# Patient Record
Sex: Female | Born: 1970 | Hispanic: No | Smoking: Former smoker
Health system: Southern US, Community
[De-identification: ages and names within clinical notes are randomized; demographics above are authoritative.]

## PROBLEM LIST (undated history)

## (undated) ENCOUNTER — Inpatient Hospital Stay (HOSPITAL_COMMUNITY): Payer: Self-pay

## (undated) ENCOUNTER — Emergency Department (HOSPITAL_BASED_OUTPATIENT_CLINIC_OR_DEPARTMENT_OTHER): Admission: EM | Payer: BC Managed Care – PPO | Source: Home / Self Care

## (undated) DIAGNOSIS — F17200 Nicotine dependence, unspecified, uncomplicated: Secondary | ICD-10-CM

## (undated) DIAGNOSIS — J45909 Unspecified asthma, uncomplicated: Secondary | ICD-10-CM

## (undated) DIAGNOSIS — F419 Anxiety disorder, unspecified: Secondary | ICD-10-CM

## (undated) DIAGNOSIS — F32A Depression, unspecified: Secondary | ICD-10-CM

## (undated) DIAGNOSIS — Z5189 Encounter for other specified aftercare: Secondary | ICD-10-CM

## (undated) DIAGNOSIS — R6889 Other general symptoms and signs: Secondary | ICD-10-CM

## (undated) DIAGNOSIS — Z21 Asymptomatic human immunodeficiency virus [HIV] infection status: Secondary | ICD-10-CM

## (undated) DIAGNOSIS — B20 Human immunodeficiency virus [HIV] disease: Secondary | ICD-10-CM

## (undated) DIAGNOSIS — D649 Anemia, unspecified: Secondary | ICD-10-CM

## (undated) DIAGNOSIS — F329 Major depressive disorder, single episode, unspecified: Secondary | ICD-10-CM

## (undated) HISTORY — DX: Unspecified asthma, uncomplicated: J45.909

## (undated) HISTORY — DX: Nicotine dependence, unspecified, uncomplicated: F17.200

## (undated) HISTORY — DX: Encounter for other specified aftercare: Z51.89

## (undated) HISTORY — DX: Anxiety disorder, unspecified: F41.9

## (undated) HISTORY — DX: Other general symptoms and signs: R68.89

---

## 1995-05-11 DIAGNOSIS — Z5189 Encounter for other specified aftercare: Secondary | ICD-10-CM

## 1995-05-11 DIAGNOSIS — IMO0001 Reserved for inherently not codable concepts without codable children: Secondary | ICD-10-CM

## 1995-05-11 HISTORY — DX: Encounter for other specified aftercare: Z51.89

## 1995-05-11 HISTORY — DX: Reserved for inherently not codable concepts without codable children: IMO0001

## 2011-01-26 LAB — US OB LIMITED

## 2011-02-17 LAB — GC/CHLAMYDIA PROBE AMP, GENITAL
Chlamydia: NEGATIVE
Gonorrhea: NEGATIVE

## 2011-02-17 LAB — RPR: RPR: NONREACTIVE

## 2011-02-17 LAB — ANTIBODY SCREEN: Antibody Screen: NEGATIVE

## 2011-02-17 LAB — HEPATITIS B SURFACE ANTIGEN: Hepatitis B Surface Ag: NEGATIVE

## 2011-03-26 LAB — US OB COMP + 14 WK

## 2011-05-10 DIAGNOSIS — B20 Human immunodeficiency virus [HIV] disease: Secondary | ICD-10-CM

## 2011-05-10 DIAGNOSIS — Z98891 History of uterine scar from previous surgery: Secondary | ICD-10-CM | POA: Insufficient documentation

## 2011-05-10 DIAGNOSIS — O09529 Supervision of elderly multigravida, unspecified trimester: Secondary | ICD-10-CM | POA: Insufficient documentation

## 2011-05-10 NOTE — Progress Notes (Signed)
Needs to nutrition and Child psychotherapist.

## 2011-05-13 ENCOUNTER — Ambulatory Visit (INDEPENDENT_AMBULATORY_CARE_PROVIDER_SITE_OTHER): Payer: Self-pay | Admitting: Obstetrics & Gynecology

## 2011-05-13 ENCOUNTER — Ambulatory Visit: Payer: Self-pay

## 2011-05-13 ENCOUNTER — Ambulatory Visit (INDEPENDENT_AMBULATORY_CARE_PROVIDER_SITE_OTHER): Payer: Self-pay

## 2011-05-13 DIAGNOSIS — O98519 Other viral diseases complicating pregnancy, unspecified trimester: Secondary | ICD-10-CM

## 2011-05-13 DIAGNOSIS — O09529 Supervision of elderly multigravida, unspecified trimester: Secondary | ICD-10-CM

## 2011-05-13 DIAGNOSIS — B20 Human immunodeficiency virus [HIV] disease: Secondary | ICD-10-CM

## 2011-05-13 DIAGNOSIS — O0990 Supervision of high risk pregnancy, unspecified, unspecified trimester: Secondary | ICD-10-CM

## 2011-05-13 DIAGNOSIS — O09899 Supervision of other high risk pregnancies, unspecified trimester: Secondary | ICD-10-CM

## 2011-05-13 DIAGNOSIS — Z9889 Other specified postprocedural states: Secondary | ICD-10-CM

## 2011-05-13 DIAGNOSIS — Z98891 History of uterine scar from previous surgery: Secondary | ICD-10-CM

## 2011-05-13 DIAGNOSIS — Z111 Encounter for screening for respiratory tuberculosis: Secondary | ICD-10-CM

## 2011-05-13 DIAGNOSIS — Z21 Asymptomatic human immunodeficiency virus [HIV] infection status: Secondary | ICD-10-CM

## 2011-05-13 NOTE — Progress Notes (Signed)
U/S scheduled 05/17/11 at 10 am. ROI signed for first U/S report. Tel.# given for [employer].

## 2011-05-13 NOTE — Progress Notes (Signed)
No vaginal discharge. Pt has appt with infectious disease today. Pt will bring name of pharmacy to next visit. Pt needs to nutrition and social worker today.

## 2011-05-13 NOTE — Progress Notes (Signed)
Nutrition Note: (1st Nutrition consult- 1st HRC visit) Pt new to area from [place], does not have WIC services, applying for [employer] and food stamps. Dx. HIV positive, obesity, advanced maternal age.  Pt reports good intake of 3-4 meals, with no food allergies, does not eat pork. Reports some nausea and heartburn, but no vomiting.  Pt has excessive wt gain linked to new anti-viral medication per patient.  Reports pregravid wt of 198#, current wt of 225.5# plots 17#> expected at [redacted]w[redacted]d gestation.  Disc need to increase physical activity asap and limit daily intake to 3-4 small, healthy meals.  Referral made to [facility] program.  Follow up in 2-4 weeks.  Cy Blamer, RD

## 2011-05-13 NOTE — Progress Notes (Signed)
Need first [place] report--sign record release.  Pt not sure of LMP.  18-week [place] refers to dating [place] which we need.  The pt doesn't think the dating [place] was accurate because the [place] tech did not specialize in OB.  Again, we will get the report.  Wt gain 27 pounds so far.  Discussed optimal weight gain is 12-20 pounds.  Meet with nutrition today.  Referral to Infectious disease.  Size > Dates so will get growth [place].  Viral load undetectable. Pt's preterm delivery was after a fall.  There was fetal distress and ended up with c/s.   Discussing VBAC vs rpt c/s.  GCT next visit. Pt states she can not have males in the delivery.  I explained we have 67 female MDs in our practice and we CAN NOT guarantee that there will be a female available at the delivery.  If there is a planned c/s, then having a female can be better accommodated.  Told patient we would be happy to take care of her.  They asked for [facility] number as there is 2 females in the practice.  Pt understands there are female anesthesiologists and pediatricians in the hospital who may be in the delivery room. Infectious dz referral.

## 2011-05-14 LAB — CBC WITH DIFFERENTIAL/PLATELET
Basophils Absolute: 0 10*3/uL (ref 0.0–0.1)
Eosinophils Relative: 1 % (ref 0–5)
Lymphocytes Relative: 32 % (ref 12–46)
Lymphs Abs: 2 10*3/uL (ref 0.7–4.0)
Neutro Abs: 3.7 10*3/uL (ref 1.7–7.7)
Neutrophils Relative %: 59 % (ref 43–77)
Platelets: 241 10*3/uL (ref 150–400)
RBC: 3.63 MIL/uL — ABNORMAL LOW (ref 3.87–5.11)
RDW: 17.3 % — ABNORMAL HIGH (ref 11.5–15.5)
WBC: 6.2 10*3/uL (ref 4.0–10.5)

## 2011-05-14 LAB — URINALYSIS
Bilirubin Urine: NEGATIVE
Protein, ur: NEGATIVE mg/dL
Urobilinogen, UA: 0.2 mg/dL (ref 0.0–1.0)

## 2011-05-14 LAB — HEPATITIS B SURFACE ANTIBODY,QUALITATIVE: Hep B S Ab: POSITIVE — AB

## 2011-05-14 LAB — HEPATITIS A ANTIBODY, TOTAL: Hep A Total Ab: POSITIVE — AB

## 2011-05-14 LAB — COMPLETE METABOLIC PANEL WITH GFR
ALT: 9 U/L (ref 0–35)
AST: 17 U/L (ref 0–37)
CO2: 23 mEq/L (ref 19–32)
Calcium: 8.8 mg/dL (ref 8.4–10.5)
Chloride: 102 mEq/L (ref 96–112)
Creat: 0.42 mg/dL — ABNORMAL LOW (ref 0.50–1.10)
GFR, Est African American: >89 mL/min
Sodium: 136 mEq/L (ref 135–145)
Total Protein: 6 g/dL (ref 6.0–8.3)

## 2011-05-14 LAB — HEPATITIS B SURFACE ANTIGEN: Hepatitis B Surface Ag: NEGATIVE

## 2011-05-14 LAB — GC/CHLAMYDIA PROBE AMP, URINE: Chlamydia, Swab/Urine, PCR: NEGATIVE

## 2011-05-14 LAB — LIPID PANEL
Cholesterol: 222 mg/dL — ABNORMAL HIGH (ref 0–200)
VLDL: 17 mg/dL (ref 0–40)

## 2011-05-14 LAB — HEPATITIS C ANTIBODY: HCV Ab: NEGATIVE

## 2011-05-14 LAB — T-HELPER CELL (CD4) - (RCID CLINIC ONLY): CD4 % Helper T Cell: 29 % — ABNORMAL LOW (ref 33–55)

## 2011-05-17 ENCOUNTER — Ambulatory Visit (HOSPITAL_COMMUNITY)
Admission: RE | Admit: 2011-05-17 | Discharge: 2011-05-17 | Disposition: A | Discharge status: Home / Self Care | Payer: Medicaid Other | Source: Ambulatory Visit | Attending: Obstetrics & Gynecology | Admitting: Obstetrics & Gynecology

## 2011-05-17 DIAGNOSIS — B20 Human immunodeficiency virus [HIV] disease: Secondary | ICD-10-CM

## 2011-05-17 DIAGNOSIS — O98519 Other viral diseases complicating pregnancy, unspecified trimester: Secondary | ICD-10-CM | POA: Insufficient documentation

## 2011-05-17 DIAGNOSIS — O3660X0 Maternal care for excessive fetal growth, unspecified trimester, not applicable or unspecified: Secondary | ICD-10-CM | POA: Insufficient documentation

## 2011-05-17 DIAGNOSIS — Z21 Asymptomatic human immunodeficiency virus [HIV] infection status: Secondary | ICD-10-CM | POA: Insufficient documentation

## 2011-05-17 LAB — HIV-1 RNA ULTRAQUANT REFLEX TO GENTYP+: HIV-1 RNA Quant, Log: <1.3 {Log} (ref <1.30)

## 2011-05-20 ENCOUNTER — Telehealth: Payer: Self-pay | Admitting: *Deleted

## 2011-05-20 NOTE — Telephone Encounter (Signed)
Pt states she forgot to call but her skin never got raised or hard & red. Remained smooth.

## 2011-05-24 NOTE — Progress Notes (Signed)
Pt is Muslim and has requested a female ID physician. She states [facility] has suggested she seek private OB care due to the need of a female physician.  [facility] will [facility] see if this it true since they follow our high risk pregnancies.   Per patient there is some concern about estimated date of delivery due to fundus height measurements.  She is scheduled for ultrasound on Jan. 17, 2012. Pt's given HIV history is not verified via medical records. Her husband is present and has mostly dominated the interview.  When questions were asked they both had two different answers.  Spouse is concerned about wife being labeled and treated differently due to HIV diagnosis. He was assured this was not practice at [facility].   Brief medical records obtained from [facility] which mostly cover pregnancy diagnosis and treatment since October , 2012.

## 2011-05-25 ENCOUNTER — Encounter: Payer: Self-pay | Admitting: Internal Medicine

## 2011-05-25 ENCOUNTER — Ambulatory Visit (INDEPENDENT_AMBULATORY_CARE_PROVIDER_SITE_OTHER): Payer: Self-pay | Admitting: Internal Medicine

## 2011-05-25 ENCOUNTER — Telehealth: Payer: Self-pay | Admitting: Internal Medicine

## 2011-05-25 DIAGNOSIS — B20 Human immunodeficiency virus [HIV] disease: Secondary | ICD-10-CM

## 2011-05-25 DIAGNOSIS — IMO0002 Reserved for concepts with insufficient information to code with codable children: Secondary | ICD-10-CM

## 2011-05-25 DIAGNOSIS — Z21 Asymptomatic human immunodeficiency virus [HIV] infection status: Secondary | ICD-10-CM

## 2011-05-25 DIAGNOSIS — E669 Obesity, unspecified: Secondary | ICD-10-CM | POA: Insufficient documentation

## 2011-05-25 DIAGNOSIS — O98919 Unspecified maternal infectious and parasitic disease complicating pregnancy, unspecified trimester: Secondary | ICD-10-CM

## 2011-05-25 MED ORDER — RITONAVIR 100 MG PO CAPS: 100.0000 mg | ORAL_CAPSULE | Freq: Every day | ORAL | Status: DC

## 2011-05-25 MED ORDER — EMTRICITABINE-TENOFOVIR DF 200-300 MG PO TABS: 1.0000 | ORAL_TABLET | Freq: Every day | ORAL | Status: DC

## 2011-05-25 MED ORDER — ATAZANAVIR SULFATE 200 MG PO CAPS: 400.0000 mg | ORAL_CAPSULE | Freq: Every day | ORAL | Status: DC

## 2011-05-25 NOTE — Progress Notes (Signed)
INFECTIOUS DISEASES HIV CLINIC  RFV: establishing care while starting 3rd trimester of pregnancy  Subjective:    Patient ID: [name] [name], female    DOB: 28-Nov-1970, 41 y.o.   MRN: [id number]  HPI [name] [name] is a 41yo Female originally from [place], with HIV disease originally diagnosed in 1995, her current CD 4 count 620(29%)/ <20 viral copies on truvada/ATZ/RTV. She is also currently 27 3/[redacted] wk pregnant with her 4th child. She moved to [place], [place] in mid Dec 2012 from [place], [place] where she was receiving HIV and prenatal care. She has 3 other children, all girls ages 57,16,11 from a different partner. The patient has been with a new partner in the < 87yrs, who is the father of the child she is carrying. He, too, is HIV positive (getting care from state of [place]).   In regards to her pregnancy, she is fatigued on most days. Occasionally has sleep disturbances secondary to baby moving and GERD. No vaginal bleeding. No contractions. She is only eating 2-3 meals per day. Concerned about weight gain. She has seen OB at [facility] clinic in town in early January to establish care. She is scheduled for OGTT on the 17th. She would like to get TDAP vaccine.  She mentioned that in early 2012 she gained 30-36 pounds, thought to be due to change in ART.   HIV history: Diagnosed 1995-97 in [place], RF: blood transfusion ART in 2001 included combivir and viracept ART in 2012 changed to atazanavir/rit/truvada Hep B immune Hep A immune  OB/GYN history: [id number] c-section in 2001 HPV negative/normal pap 04/13/10 GC/Chlam negative 09/18/10 RPR negative 02/17/11 U/S on 01/26/11 (10wks) EDD of 08/23/10/ LMP 11/22/10 toxo negative Rubella immune CMV + Blood type O, RH+  IMS: 05/01/09 tetanus 09/26/09 pneumococcal Last received flu 2004, had reaction? Ill x 7 days.  Health maintenance: quantiferon negative 09/24/10   Active Ambulatory Problems    Diagnosis Date Noted  . HIV (human immunodeficiency virus  infection) 05/10/2011  . AMA (advanced maternal age) multigravida 35+ 05/10/2011  . History of C-section 05/10/2011  . Pregnancy, supervision for, high-risk 05/13/2011   Resolved Ambulatory Problems    Diagnosis Date Noted  . No Resolved Ambulatory Problems   Past Medical History  Diagnosis Date  . Anxiety   . Blood transfusion 1995  . Preterm labor    Prior to Admission medications   Medication Sig Start Date End Date Taking? Authorizing Provider  atazanavir (REYATAZ) 300 MG capsule Take 300 mg by mouth daily.     Yes Historical Provider, MD  emtricitabine-tenofovir (TRUVADA) 200-300 MG per tablet Take 1 tablet by mouth daily.     Yes Historical Provider, MD  Prenatal Vit-Fe Fumarate-FA (PRENATAL MULTIVITAMIN) 60-1 MG tablet Take 1 tablet by mouth daily.     Yes Historical Provider, MD  ritonavir (NORVIR) 100 MG capsule Take 100 mg by mouth daily.     Yes Historical Provider, MD   Social history:  reports that she has never smoked. She has never used smokeless tobacco. She reports that she does not drink alcohol or use illicit drugs. She is a Research officer, trade union, and is asking for female provider for both her HIV physician as well as OB if possible. She worked as a Lawyer in [place], [place]. Currently not working due to her pregnancy and recently relocation. She is the mother of 3 other girls ages 82, 78,and 38 yo. She and her partner (unclear if married) now live in [place] to be closer to his mother for  helping with childrearing. She is in a relationship with HIV + female, however, they likely have different strains, since she was infected prior to them meeting. He is on different HIV regimen but ATZ based.  Family history: family history is not on file.   Review of Systems   Constitutional: Negative for fever, chills, diaphoresis. She does have fatigue, increased appetite, and weight change due to pregnancy HENT: Negative for congestion, sore throat, rhinorrhea, sneezing, trouble swallowing  and sinus pressure.  Eyes: Negative for photophobia and visual disturbance.  Respiratory: Negative for cough, chest tightness, shortness of breath, wheezing and stridor.  Cardiovascular: Negative for chest pain, palpitations and leg swelling.  Gastrointestinal: occassional for nausea, but no vomiting, abdominal pain, diarrhea, constipation, blood in stool, abdominal distention and anal bleeding.  Genitourinary: Negative for dysuria, hematuria, flank pain and difficulty urinating.  Musculoskeletal: Negative for myalgias, back pain, joint swelling, arthralgias and gait problem.  Skin: Negative for color change, pallor, rash and wound.  Neurological: Negative for dizziness, tremors, weakness and light-headedness.  Hematological: Negative for adenopathy. Does not bruise/bleed easily.  Psychiatric/Behavioral: Negative for behavioral problems, confusion, sleep disturbance, dysphoric mood, decreased concentration and agitation.       Objective:   Physical Exam BP 101/67  Pulse 71  Temp(Src) 97.5 F (36.4 C) (Oral)  Ht 5\' 3"  (1.6 m)  Wt 224 lb (101.606 kg)  BMI 39.68 kg/m2  LMP 11/22/2010  General Appearance:    Alert, cooperative, no distress, appears stated age, but fatigue appearing  Head:    Normocephalic, without obvious abnormality, atraumatic  Eyes:    PERRL, conjunctiva/corneas clear, EOM's intact, fundi    benign, both eyes  Ears:    Normal TM's and external ear canals, both ears  Nose:   Nares normal, septum midline, mucosa normal, no drainage    or sinus tenderness  Throat:   Lips, mucosa, and tongue normal; teeth and gums normal  Neck:   Supple, symmetrical, trachea midline, no adenopathy;      Back:     Symmetric, no curvature, ROM normal, no CVA tenderness  Lungs:     Clear to auscultation bilaterally, respirations unlabored      Heart:    Tachy, S1 and S2 normal, no murmur, rub   or gallop     Abdomen:      non-tender, bowel sounds active all four quadrants,    Gravid  abdomen        Extremities:   Extremities normal, atraumatic, no cyanosis or edema  Pulses:   2+ and symmetric all extremities  Skin:   Skin color, texture, turgor normal, no rashes or lesions  Lymph nodes:   Cervical, supraclavicular, and axillary nodes normal  Neurologic:   CNII-XII intact, normal strength, sensation and reflexes    throughout      Assessment & Plan:  HIV in pregnancy = we will need to increase her atazanavir from 300mg  daily to 400mg  daily based upon CDC perinatal guidelines, where PK studies show that standard dosing has decreased plasma concentrations in women in their 2nd and 3rd trimester of pregnancy, especially if taken with tenofovir (which she is also on). We will have her new dosage of atazanavir delivered to her home per pharmacare/medicaid approval.  Her HIV regimen will be the following: Truvada, 1 tablet  Daily atazanavir 400mg , 1 tablet daily Ritonavir 100mg , 1 tablet daily  Pregnancy = after much discussion, it appears that patient has applied for medicaid but did not mention that she is  HIV positive, thus emergency medicaid has not been allocated yet. We will contact the health department/medicaid coordinator in order to rectify< 3 days. Continue to take prenatal vitamins  We will arrange for her to get TDAP vaccine, and recommend that her partner, get vaccinated as well.  Health maintenance = patient has refused to get flu vaccine due to prior history of intolerance.  Patient can be reached through her partner's phone number : [phone].  RTC in 3-4 wks, sooner if symptoms arise.  A total of 45 minutes spent on this visit with greater than 50% spent for coordination of care, speaking to providers in [place], and [place] troubleshooting

## 2011-05-26 ENCOUNTER — Other Ambulatory Visit: Payer: Self-pay | Admitting: *Deleted

## 2011-05-26 ENCOUNTER — Telehealth: Payer: Self-pay | Admitting: *Deleted

## 2011-05-26 ENCOUNTER — Telehealth: Payer: Self-pay

## 2011-05-26 DIAGNOSIS — O98919 Unspecified maternal infectious and parasitic disease complicating pregnancy, unspecified trimester: Secondary | ICD-10-CM

## 2011-05-26 DIAGNOSIS — B20 Human immunodeficiency virus [HIV] disease: Secondary | ICD-10-CM

## 2011-05-26 MED ORDER — EMTRICITABINE-TENOFOVIR DF 200-300 MG PO TABS: 1.0000 | ORAL_TABLET | Freq: Every day | ORAL | Status: DC

## 2011-05-26 MED ORDER — ATAZANAVIR SULFATE 200 MG PO CAPS: 400.0000 mg | ORAL_CAPSULE | Freq: Every day | ORAL | Status: DC

## 2011-05-26 MED ORDER — RITONAVIR 100 MG PO CAPS: 100.0000 mg | ORAL_CAPSULE | Freq: Every day | ORAL | Status: DC

## 2011-05-26 NOTE — Telephone Encounter (Signed)
Called ADAP and got the Medicaid ID number [id number] from processor. We put id # into EPIC coverage and it is showing she is eligible.

## 2011-05-26 NOTE — Telephone Encounter (Signed)
05-25-11 I spoke with Mrs [name] [name]  Of Social Services  regarding status of pregnancy medicaid for patient.   The patient is saying she was told it would be ready within two weeks.  We are concerned since she is pregnant.  I asked if her application could be rushed due to the fact she is pregnant with HIV. Social Worker stated the process is based on her case load and not by diagnosis or critical need.  Policy is 45 days from the submission of application.  If she gets to it sooner she will contact the patient.  Information shared with Dr Drue Second.   Patient was contacted on same day and notified to come to our office to speak with the financial counselor to apply for ADAP until the medicaid is approved.   Laurell Josephs, RN IV, BSN

## 2011-05-26 NOTE — Telephone Encounter (Signed)
Patient and her partner were here for ADAP application submission.  Her partner is insisting that Dr Drue Second and I promised medications to patient by Friday. This is not true.  We promised we would to have this issue resolved by Friday if possible.  He continues to ask multiple questions and states that he did not think Dr Drue Second was a physician since she did not have the chart during the visit yesterday and she never reviewed all of the labs.  Most of the doctors he is use to seeing always does this.  He continues to question her credentials and add odd comments.  Finally I told him I would speak with the patient if she had concerns and she did not.  Copies of labs were given to the patient at the request of her partner.   During the visit with Britta Mccreedy he tried to make her commit to a date for the application approval and medications. He was informed we will submit  The application and include she is a pregnant female.  Once we receive approval we will contact the patient regarding medication status.    The partner has continued to be overbearing and making inappropriate assumptions regarding our practice and the process for obtaining medical care.   [facility] regarding the complaint made by the patient and her partner about referring their care elsewhere per Dr Feliz Beam request.   I just spoke with Idamae Schuller at [facility].  She states the patient should be able to see a female OB physician at her routine visits. If she has an emergency there is not guarantee the physician will be female.  She says there are 4 female OB physicians that work the high risk clinic.     Pt will be informed to continue with high risk OB at [facility] hospital.    Laurell Josephs, RN IV, BSN

## 2011-05-26 NOTE — Telephone Encounter (Signed)
Patient and husband walked in this morning to apply for ADAP - she had applied for Medicaid, but was told it would take 45 days, so did adap app and faxed to Maryellen Pile, processor at ADAP - she called back when she received application and stated patient had Medicaid and it retro'd back to December 2012 and she would be denied for ADAP. Patient's caseworker told Tomasita Morrow that it was not approved yet and would be 45 days. Tammy is calling caseworker to let her know ADAP did not approve patient's application, due to fact they checked in system and claim she has it.   Will wait until Tammy verifies info with caseworker before calling patient back. Patient's husband, who she is separated from, was very demanding and stating things people were telling him that were not true. He said he was told we could have an answer from ADAP by Friday - I advised him we would fax application since patient is pregnant, but it may take a few days to approve it - normally it takes 2 weeks. We would call him this PM with an answer as to status. Now that she has Medicaid, she was denied.

## 2011-05-26 NOTE — Telephone Encounter (Signed)
Patients boyfriend called on her behalf. He wanted to know if we had found anything out on the patients ADAP or Medicaid. Advised him that we found that the patient has medicaid and gave him her #. Also told him I had just called the patients meds in to Pharmacare as they requested and gave him the number for her to call and arrange for delivery. He asked how they could get a copy of the card and how she had something they never signed up for. Advised him not sure but that he could call the medicaid office and get all that info. Reminded him to call the pharmacy and ended the call.

## 2011-05-27 ENCOUNTER — Telehealth: Payer: Self-pay | Admitting: *Deleted

## 2011-05-27 ENCOUNTER — Ambulatory Visit (INDEPENDENT_AMBULATORY_CARE_PROVIDER_SITE_OTHER): Payer: Medicaid Other | Admitting: Physician Assistant

## 2011-05-27 ENCOUNTER — Encounter: Payer: Self-pay | Admitting: Physician Assistant

## 2011-05-27 VITALS — BP 99/65 | HR 82 | Temp 98.2°F | Wt 223.9 lb

## 2011-05-27 DIAGNOSIS — B20 Human immunodeficiency virus [HIV] disease: Secondary | ICD-10-CM

## 2011-05-27 DIAGNOSIS — Z98891 History of uterine scar from previous surgery: Secondary | ICD-10-CM

## 2011-05-27 DIAGNOSIS — Z21 Asymptomatic human immunodeficiency virus [HIV] infection status: Secondary | ICD-10-CM

## 2011-05-27 DIAGNOSIS — Z9889 Other specified postprocedural states: Secondary | ICD-10-CM

## 2011-05-27 DIAGNOSIS — O0990 Supervision of high risk pregnancy, unspecified, unspecified trimester: Secondary | ICD-10-CM

## 2011-05-27 LAB — POCT URINALYSIS DIP (DEVICE)
Bilirubin Urine: NEGATIVE
Glucose, UA: NEGATIVE mg/dL
Hgb urine dipstick: NEGATIVE
Nitrite: NEGATIVE

## 2011-05-27 MED ORDER — TETANUS-DIPHTH-ACELL PERTUSSIS 5-2.5-18.5 LF-MCG/0.5 IM SUSP
0.5000 mL | Freq: Once | INTRAMUSCULAR | Status: AC
  Administered 2011-06-17: 0.5 mL via INTRAMUSCULAR

## 2011-05-27 NOTE — Progress Notes (Signed)
Addended by: Sherre Lain A on: 05/27/2011 11:24 AM   Modules accepted: Orders

## 2011-05-27 NOTE — Progress Notes (Signed)
ID appt tomorrow. No complaints. Desiring female at delivery. Uncertain of desire for RLTCS or TOLAC. TOLAC info reviewed. ROI for records/op note from [place]. Pt reports large volume blood loss during c/s, but refused transfusion. Current HIV quant undetectable. 1 hour glucola and tdap today.

## 2011-05-27 NOTE — Patient Instructions (Signed)

## 2011-05-27 NOTE — Progress Notes (Signed)
1 hr GTT today

## 2011-05-27 NOTE — Telephone Encounter (Signed)
His wife just got the reyatz delivered & he wanted to make sure of the dose. It is 2 tabs of 200mg  each. Take 2 in am with breakfast. He repeated it. I told him to put aside the bottle of 300mg  & not use.

## 2011-05-28 LAB — GLUCOSE TOLERANCE, 1 HOUR: Glucose, 1 Hour GTT: 86 mg/dL (ref 70–140)

## 2011-06-03 NOTE — Telephone Encounter (Signed)
Called pt in order to discuss update on medicaid paperwork

## 2011-06-04 ENCOUNTER — Telehealth: Payer: Self-pay | Admitting: *Deleted

## 2011-06-04 ENCOUNTER — Other Ambulatory Visit: Payer: Self-pay | Admitting: *Deleted

## 2011-06-04 DIAGNOSIS — B20 Human immunodeficiency virus [HIV] disease: Secondary | ICD-10-CM

## 2011-06-04 MED ORDER — ATAZANAVIR SULFATE 200 MG PO CAPS: 200.0000 mg | ORAL_CAPSULE | Freq: Every day | ORAL | Status: DC

## 2011-06-04 MED ORDER — RITONAVIR 100 MG PO CAPS: 100.0000 mg | ORAL_CAPSULE | Freq: Every day | ORAL | Status: DC

## 2011-06-04 MED ORDER — EMTRICITABINE-TENOFOVIR DF 200-300 MG PO TABS: 1.0000 | ORAL_TABLET | Freq: Every day | ORAL | Status: DC

## 2011-06-04 MED ORDER — PRENATAL RX 60-1 MG PO TABS: 1.0000 | ORAL_TABLET | Freq: Every day | ORAL | Status: DC

## 2011-06-04 NOTE — Telephone Encounter (Signed)
A female caller left message that Meira needs prenatal medication. Please call back.

## 2011-06-04 NOTE — Telephone Encounter (Signed)
Called pt and informed her that I will send in refill to her pharmacy. She can pick up today. Pt voiced understanding.

## 2011-06-17 ENCOUNTER — Encounter: Payer: Self-pay | Admitting: Family Medicine

## 2011-06-17 ENCOUNTER — Ambulatory Visit (INDEPENDENT_AMBULATORY_CARE_PROVIDER_SITE_OTHER): Payer: Medicaid Other | Admitting: Family Medicine

## 2011-06-17 DIAGNOSIS — O0990 Supervision of high risk pregnancy, unspecified, unspecified trimester: Secondary | ICD-10-CM

## 2011-06-17 DIAGNOSIS — B20 Human immunodeficiency virus [HIV] disease: Secondary | ICD-10-CM

## 2011-06-17 DIAGNOSIS — Z21 Asymptomatic human immunodeficiency virus [HIV] infection status: Secondary | ICD-10-CM

## 2011-06-17 DIAGNOSIS — O98519 Other viral diseases complicating pregnancy, unspecified trimester: Secondary | ICD-10-CM

## 2011-06-17 LAB — POCT URINALYSIS DIP (DEVICE)
Protein, ur: 30 mg/dL — AB
Urobilinogen, UA: 1 mg/dL (ref 0.0–1.0)
pH: 6.5 (ref 5.0–8.0)

## 2011-06-17 NOTE — Patient Instructions (Addendum)
Vaginal Birth After Cesarean Delivery Vaginal birth after Cesarean delivery (VBAC) is giving birth vaginally after previously delivering a baby by a cesarean. In the past, if a woman had a Cesarean delivery, all births afterwards would be done by Cesarean delivery. This is no longer true. It can be safe for the mother to try a vaginal delivery after having a Cesarean. The final decision to have a VBAC or repeat Cesarean delivery should be between the patient and her caregiver. The risks and benefits can be discussed relative to the reason for, and the type of the previous Cesarean delivery. WOMEN WHO PLAN TO HAVE A VBAC SHOULD CHECK WITH THEIR DOCTOR TO BE SURE THAT:  The previous Cesarean was done with a low transverse uterine incision (not a vertical classical incision).   The birth canal is big enough for the baby.   There were no other operations on the uterus.   They will have an electronic fetal monitor (EFM) on at all times during labor.   An operating room would be available and ready in case an emergency Cesarean is needed.   A doctor and surgical nursing staff would be available at all times during labor to be ready to do an emergency Cesarean if necessary.   An anesthesiologist would be present in case an emergency Cesarean is needed.   The nursery is prepared and has adequate personnel and necessary equipment available to care for the baby in case of an emergency Cesarean.  BENEFITS OF VBAC:  Shorter stay in the hospital.   Lower delivery, nursery and hospital costs.   Less blood loss and need for blood transfusions.   Less fever and discomfort from major surgery.   Lower risk of blood clots.   Lower risk of infection.   Shorter recovery after going home.   Lower risk of other surgical complications, such as opening of the incision or hernia in the incision.   Decreased risk of injury to other organs.   Decreased risk for having to remove the uterus (hysterectomy).     Decreased risk for the placenta to completely or partially cover the opening of the uterus (placenta previa) with a future pregnancy.   Ability to have a larger family if desired.  RISKS OF A VBAC:  Rupture of the uterus.   Having to remove the uterus (hysterectomy) if it ruptures.   All the complications of major surgery and/or injury to other organs.   Excessive bleeding, blood clots and infection.   Lower Apgar scores (method to evaluate the newborn based on appearance, pulse, grimace, activity, and respiration) and more risks to the baby.   There is a higher risk of uterine rupture if you induce or augment labor.   There is a higher risk of uterine rupture if you use medications to ripen the cervix.  VBAC SHOULD NOT BE DONE IF:  The previous Cesarean was done with a vertical (classical) or T-shaped incision, or you do not know what kind of an incision was made.   You had a ruptured uterus.   You had surgery on your uterus.   You have medical or obstetrical problems.   There are problems with the baby.   There were two previous Cesarean deliveries and no vaginal deliveries.  OTHER FACTS TO KNOW ABOUT VBAC:  It is safe to have an epidural anesthetic with VBAC.   It is safe to turn the baby from a breech position (attempt an external cephalic version).   It is  safe to try a VBAC with twins.   Pregnancies later than 40 weeks have not been successful with VBAC.   There is an increased failure rate of a VBAC in obese pregnant women.   There is an increased failure rate with VABC if the baby weighs 8.8 pounds (4000 grams) or more.   There is an increased failure rate if the time between the Cesarean and VBAC is less than 19 months.   There is an increased failure rate if pre-eclampsia is present (high blood pressure, protein in the urine and swelling of face and extremities).   VBAC is very successful if there was a previous vaginal birth.   VBAC is very successful  when the labor starts spontaneously before the due date.   Delivery of VBAC is similar to having a normal spontaneous vaginal delivery.  It is important to discuss VBAC with your caregiver early in the pregnancy so you can understand the risks, benefits and options. It will give you time to decide what is best in your particular case relevant to the reason for your previous Cesarean delivery. It should be understood that medical changes in the mother or pregnancy may occur during the pregnancy, which make it necessary to change you or your caregiver's initial decision. The counseling, concerns and decisions should be documented in the medical record and signed by all parties. Document Released: 10/17/2006 Document Revised: 01/06/2011 Document Reviewed: 06/07/2008 [facility] Patient Information 2012 [place], [place]. Pregnancy - Third Trimester The third trimester of pregnancy (the last 3 months) is a period of the most rapid growth for you and your baby. The baby approaches a length of 20 inches and a weight of 6 to 10 pounds. The baby is adding on fat and getting ready for life outside your body. While inside, babies have periods of sleeping and waking, suck their thumbs, and hiccups. You can often feel small contractions of the uterus. This is false labor. It is also called Braxton-Hicks contractions. This is like a practice for labor. The usual problems in this stage of pregnancy include more difficulty breathing, swelling of the hands and feet from water retention, and having to urinate more often because of the uterus and baby pressing on your bladder.  PRENATAL EXAMS  Blood work may continue to be done during prenatal exams. These tests are done to check on your health and the probable health of your baby. Blood work is used to follow your blood levels (hemoglobin). Anemia (low hemoglobin) is common during pregnancy. Iron and vitamins are given to help prevent this. You may also continue to be checked  for diabetes. Some of the past blood tests may be done again.   The size of the uterus is measured during each visit. This makes sure your baby is growing properly according to your pregnancy dates.   Your blood pressure is checked every prenatal visit. This is to make sure you are not getting toxemia.   Your urine is checked every prenatal visit for infection, diabetes and protein.   Your weight is checked at each visit. This is done to make sure gains are happening at the suggested rate and that you and your baby are growing normally.   Sometimes, an ultrasound is performed to confirm the position and the proper growth and development of the baby. This is a test done that bounces harmless sound waves off the baby so your caregiver can more accurately determine due dates.   Discuss the type of pain medication  and anesthesia you will have during your labor and delivery.   Discuss the possibility and anesthesia if a Cesarean Section might be necessary.   Inform your caregiver if there is any mental or physical violence at home.  Sometimes, a specialized non-stress test, contraction stress test and biophysical profile are done to make sure the baby is not having a problem. Checking the amniotic fluid surrounding the baby is called an amniocentesis. The amniotic fluid is removed by sticking a needle into the belly (abdomen). This is sometimes done near the end of pregnancy if an early delivery is required. In this case, it is done to help make sure the baby's lungs are mature enough for the baby to live outside of the womb. If the lungs are not mature and it is unsafe to deliver the baby, an injection of cortisone medication is given to the mother 1 to 2 days before the delivery. This helps the baby's lungs mature and makes it safer to deliver the baby. CHANGES OCCURING IN THE THIRD TRIMESTER OF PREGNANCY Your body goes through many changes during pregnancy. They vary from person to person. Talk to  your caregiver about changes you notice and are concerned about.  During the last trimester, you have probably had an increase in your appetite. It is normal to have cravings for certain foods. This varies from person to person and pregnancy to pregnancy.   You may begin to get stretch marks on your hips, abdomen, and breasts. These are normal changes in the body during pregnancy. There are no exercises or medications to take which prevent this change.   Constipation may be treated with a stool softener or adding bulk to your diet. Drinking lots of fluids, fiber in vegetables, fruits, and whole grains are helpful.   Exercising is also helpful. If you have been very active up until your pregnancy, most of these activities can be continued during your pregnancy. If you have been less active, it is helpful to start an exercise program such as walking. Consult your caregiver before starting exercise programs.   Avoid all smoking, alcohol, un-prescribed drugs, herbs and "street drugs" during your pregnancy. These chemicals affect the formation and growth of the baby. Avoid chemicals throughout the pregnancy to ensure the delivery of a healthy infant.   Backache, varicose veins and hemorrhoids may develop or get worse.   You will tire more easily in the third trimester, which is normal.   The baby's movements may be stronger and more often.   You may become short of breath easily.   Your belly button may stick out.   A yellow discharge may leak from your breasts called colostrum.   You may have a bloody mucus discharge. This usually occurs a few days to a week before labor begins.  HOME CARE INSTRUCTIONS   Keep your caregiver's appointments. Follow your caregiver's instructions regarding medication use, exercise, and diet.   During pregnancy, you are providing food for you and your baby. Continue to eat regular, well-balanced meals. Choose foods such as meat, fish, milk and other low fat dairy  products, vegetables, fruits, and whole-grain breads and cereals. Your caregiver will tell you of the ideal weight gain.   A physical sexual relationship may be continued throughout pregnancy if there are no other problems such as early (premature) leaking of amniotic fluid from the membranes, vaginal bleeding, or belly (abdominal) pain.   Exercise regularly if there are no restrictions. Check with your caregiver if you are  unsure of the safety of your exercises. Greater weight gain will occur in the last 2 trimesters of pregnancy. Exercising helps:   Control your weight.   Get you in shape for labor and delivery.   You lose weight after you deliver.   Rest a lot with legs elevated, or as needed for leg cramps or low back pain.   Wear a good support or jogging bra for breast tenderness during pregnancy. This may help if worn during sleep. Pads or tissues may be used in the bra if you are leaking colostrum.   Do not use hot tubs, steam rooms, or saunas.   Wear your seat belt when driving. This protects you and your baby if you are in an accident.   Avoid raw meat, cat litter boxes and soil used by cats. These carry germs that can cause birth defects in the baby.   It is easier to loose urine during pregnancy. Tightening up and strengthening the pelvic muscles will help with this problem. You can practice stopping your urination while you are going to the bathroom. These are the same muscles you need to strengthen. It is also the muscles you would use if you were trying to stop from passing gas. You can practice tightening these muscles up 10 times a set and repeating this about 3 times per day. Once you know what muscles to tighten up, do not perform these exercises during urination. It is more likely to cause an infection by backing up the urine.   Ask for help if you have financial, counseling or nutritional needs during pregnancy. Your caregiver will be able to offer counseling for these  needs as well as refer you for other special needs.   Make a list of emergency phone numbers and have them available.   Plan on getting help from family or friends when you go home from the hospital.   Make a trial run to the hospital.   Take prenatal classes with the father to understand, practice and ask questions about the labor and delivery.   Prepare the baby's room/nursery.   Do not travel out of the city unless it is absolutely necessary and with the advice of your caregiver.   Wear only low or no heal shoes to have better balance and prevent falling.  MEDICATIONS AND DRUG USE IN PREGNANCY  Take prenatal vitamins as directed. The vitamin should contain 1 milligram of folic acid. Keep all vitamins out of reach of children. Only a couple vitamins or tablets containing iron may be fatal to a baby or young child when ingested.   Avoid use of all medications, including herbs, over-the-counter medications, not prescribed or suggested by your caregiver. Only take over-the-counter or prescription medicines for pain, discomfort, or fever as directed by your caregiver. Do not use aspirin, ibuprofen (Motrin, Advil, Nuprin) or naproxen (Aleve) unless OK'd by your caregiver.   Let your caregiver also know about herbs you may be using.   Alcohol is related to a number of birth defects. This includes fetal alcohol syndrome. All alcohol, in any form, should be avoided completely. Smoking will cause low birth rate and premature babies.   Street/illegal drugs are very harmful to the baby. They are absolutely forbidden. A baby born to an addicted mother will be addicted at birth. The baby will go through the same withdrawal an adult does.  SEEK MEDICAL CARE IF: You have any concerns or worries during your pregnancy. It is better to call with  your questions if you feel they cannot wait, rather than worry about them. DECISIONS ABOUT CIRCUMCISION You may or may not know the sex of your baby. If you  know your baby is a boy, it may be time to think about circumcision. Circumcision is the removal of the foreskin of the penis. This is the skin that covers the sensitive end of the penis. There is no proven medical need for this. Often this decision is made on what is popular at the time or based upon religious beliefs and social issues. You can discuss these issues with your caregiver or pediatrician. SEEK IMMEDIATE MEDICAL CARE IF:   An unexplained oral temperature above 102 F (38.9 C) develops, or as your caregiver suggests.   You have leaking of fluid from the vagina (birth canal). If leaking membranes are suspected, take your temperature and tell your caregiver of this when you call.   There is vaginal spotting, bleeding or passing clots. Tell your caregiver of the amount and how many pads are used.   You develop a bad smelling vaginal discharge with a change in the color from clear to white.   You develop vomiting that lasts more than 24 hours.   You develop chills or fever.   You develop shortness of breath.   You develop burning on urination.   You loose more than 2 pounds of weight or gain more than 2 pounds of weight or as suggested by your caregiver.   You notice sudden swelling of your face, hands, and feet or legs.   You develop belly (abdominal) pain. Round ligament discomfort is a common non-cancerous (benign) cause of abdominal pain in pregnancy. Your caregiver still must evaluate you.   You develop a severe headache that does not go away.   You develop visual problems, blurred or double vision.   If you have not felt your baby move for more than 1 hour. If you think the baby is not moving as much as usual, eat something with sugar in it and lie down on your left side for an hour. The baby should move at least 4 to 5 times per hour. Call right away if your baby moves less than that.   You fall, are in a car accident or any kind of trauma.   There is mental or  physical violence at home.  Document Released: 04/20/2001 Document Revised: 01/06/2011 Document Reviewed: 10/23/2008 [facility] Patient Information 2012 [place], [place]. Birth Control Choices Birth control is the use of any practices, methods, or devices to prevent pregnancy from happening in a sexually active woman.  Below are some birth control choices to help avoid pregnancy.  Not having sex (abstinence) is the surest form of birth control. This requires self-control. There is no risk of acquiring a sexually transmitted disease (STD), including acquired immunodeficiency syndrome (AIDS).   Periodic abstinence requires self-control during certain times of the month.   Calendar method, timing your menstrual periods from month to month.   Ovulation method is avoiding sexual intercourse around the time you produce an egg (ovulate).   Symptotherm method is avoiding sexual intercourse at the time of ovulation, using a thermometer and ovulation symptoms.   Post ovulation method is the timing of sexual intercourse after you ovulated.  These methods do not protect against STDs, including AIDS.  Birth control pills (BCPs) contain estrogen and progesterone hormone. These medicines work by stopping the egg from forming in the ovary (ovulation). Birth control pills are prescribed by a  caregiver who will ask you questions about the risks of taking BCPs. Birth control pills do not protect against STDs, including AIDS.   "Minipill" birth control pills have only the progesterone hormone. They are taken every day of each month and must be prescribed by your caregiver. They do not protect against STDs, including AIDS.   Emergency contraception is often call the "morning after" pill. This pill can be taken right after sex or up to five days after sex if you think your birth control failed, you failed to use contraception, or you were forced to have sex. It is most effective the sooner you take the pills after  having sexual intercourse. Do not use emergency contraception as your only form of birth control. Emergency contraceptive pills are available without a prescription. Check with your pharmacist.   Condoms are a thin sheath of latex, synthetic material, or lambskin worn over the penis during sexual intercourse. They can have a spermicide in or on them when you buy them. Latex condoms can prevent pregnancy and STDs. "Natural" or lambskin condoms can prevent pregnancy but may not protect against STDs, including AIDS.   Female condoms are a soft, loose-fitting sheath that is put into the vagina before sexual intercourse. They can prevent pregnancy and STDs, including AIDS.   Sponge is a soft, circular piece of polyurethane foam with spermicide in it that is inserted into the vagina after wetting it and before sexual intercourse. It does not require a prescription from your caregiver. It does not protect against STDs, including AIDS.   Diaphragm is a soft, latex, dome-shaped barrier that must be fitted by a caregiver. It is inserted into the vagina, along with a spermicidal jelly. After the proper fitting for a diaphragm, always insert the diaphragm before intercourse. The diaphragm should be left in the vagina for 6 to 8 hours after intercourse. Removal and reinsertion with a spermicide is always necessary after any use. It does not protect against STDs, including AIDS.   Progesterone-only injections are given every 3 months to prevent pregnancy. These injections contain synthetic progesterone and no estrogen. This hormone stops the ovaries from releasing eggs. It also causes the cervical mucus to thicken and changes the uterine lining. This makes it harder for sperm to survive in the uterus. It does not protect against STDs, including AIDS.   Birth Control Patch contains hormones similar to those in birth control pills, so effectiveness, risks, and side effects are similar. It must be changed once a week and  is prescribed by a caregiver. It is less effective in very overweight women. It does not protect against STDs, including AIDS.   Vaginal Ring contains hormones similar to those in birth control pills. It is left in place for 3 weeks, removed for 1 week, and then a new one is put back into the vagina. It comes with a timer to put in your purse to help you remember when to take it out or put a new one in. A caregiver's examination and prescription is necessary, just like with birth control pills and the patch. It does not protect against STDs, including AIDS.   Estrogen plus progesterone injections are given every 28 to 30 days. They can be given in the upper arm, thigh, or buttocks. It does not protect against STDs, including AIDS.   Intrauterine device (IUD): copper T or progestin filled is a T-shaped device that is put in a woman's uterus during a menstrual period to prevent pregnancy. The copper  T IUD can last 10 years, and the progestin IUD can last 5 years. The progestin IUD can also help control heavy menstrual periods. It does not protect against STDs, including AIDS. The copper T IUD can be used as emergency contraception if inserted within 5 days of having unprotected intercourse.   Cervical cap is a round, soft latex or plastic cup that fits over the cervix and must be fitted by a caregiver. You do not need to use a spermicide with it or remove and insert it every time you have sexual intercourse. It does not protect against STDs, including AIDS.   Spermicides are chemicals that kill or block sperm from entering the cervix and uterus. They come in the form of creams, jellies, suppositories, foam, or tablets, and they do not require a prescription. They are inserted into the vagina with an applicator before having sexual intercourse. This must be repeated every time you have sexual intercourse.   Withdrawal is using the method of the female withdrawing his penis from sexual intercourse before he  has a climax and deposits his sperm. It does not protect against STDs, including AIDS.   Female tubal ligation is when the woman's fallopian tubes are surgically sealed or tied to prevent the egg from traveling to the uterus. It does not protect against STDs, including AIDS.   Female sterilization is when the female has his tubes that carry sperm tied off (vasectomy) to stop sperm from entering the vagina during sexual intercourse. It does not protect against STDs, including AIDS.  Regardless of which method of birth control you choose, it is still important that you use some form of protection against STDs. Document Released: 04/26/2005 Document Revised: 05/29/2010 Document Reviewed: 03/13/2009 [facility] Patient Information 2012 [place], [place].

## 2011-06-17 NOTE — Progress Notes (Signed)
Desires TOLAC info given, wants IOL at 38 wks, with female provider.

## 2011-06-17 NOTE — Progress Notes (Signed)
Needs tdap today missed it at last visit.

## 2011-06-21 ENCOUNTER — Inpatient Hospital Stay (HOSPITAL_COMMUNITY)
Admission: AD | Admit: 2011-06-21 | Discharge: 2011-06-21 | Disposition: A | Discharge status: Home / Self Care | Payer: Medicaid Other | Source: Ambulatory Visit | Attending: Obstetrics & Gynecology | Admitting: Obstetrics & Gynecology

## 2011-06-21 ENCOUNTER — Encounter (HOSPITAL_COMMUNITY): Payer: Self-pay | Admitting: *Deleted

## 2011-06-21 DIAGNOSIS — O479 False labor, unspecified: Secondary | ICD-10-CM

## 2011-06-21 DIAGNOSIS — N949 Unspecified condition associated with female genital organs and menstrual cycle: Secondary | ICD-10-CM | POA: Insufficient documentation

## 2011-06-21 DIAGNOSIS — R109 Unspecified abdominal pain: Secondary | ICD-10-CM | POA: Insufficient documentation

## 2011-06-21 DIAGNOSIS — O4700 False labor before 37 completed weeks of gestation, unspecified trimester: Secondary | ICD-10-CM | POA: Insufficient documentation

## 2011-06-21 HISTORY — DX: Asymptomatic human immunodeficiency virus (hiv) infection status: Z21

## 2011-06-21 HISTORY — DX: Human immunodeficiency virus (HIV) disease: B20

## 2011-06-21 LAB — WET PREP, GENITAL: Yeast Wet Prep HPF POC: NONE SEEN

## 2011-06-21 LAB — URINALYSIS, ROUTINE W REFLEX MICROSCOPIC
Glucose, UA: NEGATIVE mg/dL
Hgb urine dipstick: NEGATIVE
Protein, ur: NEGATIVE mg/dL
Specific Gravity, Urine: <1.005 — ABNORMAL LOW (ref 1.005–1.030)

## 2011-06-21 NOTE — Progress Notes (Signed)
Pt c/o intermittent  lower pelvic pain that started last night, with clear liquid discharge. No vaginal bleeding. Pt c/o constipation.

## 2011-06-21 NOTE — ED Provider Notes (Signed)
Chief Complaint:  Pelvic Pain   [name] [name] is  41 y.o. [id number] at [redacted]w[redacted]d presents complaining of Pelvic Pain described as cramping lower abdominal pain and pelvic pressure for 2-3 days.  She states irregular, every 30 minutes contractions, clear vaginal discharge x1 day, no vaginal bleeding, and active fetal movement. She reports concern about this pregnancy because she fell during her last pregnancy and then had a c-section at 35 weeks.  So far this pregnancy has been without complication.    Obstetrical/Gynecological History: Menstrual History: OB History    Grav Para Term Preterm Abortions TAB SAB Ect Mult Living   6 3 2 1 2  2   3        Patient's last menstrual period was 11/22/2010.     Past Medical History: Past Medical History  Diagnosis Date  . Anxiety   . Blood transfusion 1995    contracted HIV from transfusion  . Preterm labor   . HIV (human immunodeficiency virus infection)     Past Surgical History: Past Surgical History  Procedure Date  . Cesarean section 04/2000    Family History: No family history on file.  Social History: History  Substance Use Topics  . Smoking status: Never Smoker   . Smokeless tobacco: Never Used  . Alcohol Use: No    Allergies:  Allergies  Allergen Reactions  . Latex Rash    Meds:  Prescriptions prior to admission  Medication Sig Dispense Refill  . atazanavir (REYATAZ) 200 MG capsule Take 1 capsule (200 mg total) by mouth daily with breakfast.  30 capsule  3  . emtricitabine-tenofovir (TRUVADA) 200-300 MG per tablet Take 1 tablet by mouth daily.  30 tablet  3  . Prenatal Vit-Fe Fumarate-FA (PRENATAL MULTIVITAMIN) 60-1 MG tablet Take 1 tablet by mouth daily.  30 tablet  6  . ritonavir (NORVIR) 100 MG capsule Take 1 capsule (100 mg total) by mouth daily.  30 capsule  3    Review of Systems - Please refer to the aforementioned patients' reports.     Physical Exam  Blood pressure 98/49, pulse 79, temperature 98.2 F  (36.8 C), temperature source Oral, resp. rate 20, last menstrual period 11/22/2010. GENERAL: Well-developed, well-nourished female in no acute distress.  LUNGS: Clear to auscultation bilaterally.  HEART: Regular rate and rhythm. ABDOMEN: Soft, nontender, nondistended, gravid.  EXTREMITIES: Nontender, no edema, 2+ distal pulses. Cervical Exam:0/th/hi, soft, posterior Pelvic exam:  Pooling negative, small amount white mucous-like discharge Presentation: not determined FHT:  Baseline rate 130 bpm   Variability moderate  Accelerations present   Decelerations none Contractions: None   Labs: Recent Results (from the past 24 hour(s))  URINALYSIS, ROUTINE W REFLEX MICROSCOPIC   Collection Time   06/21/11  3:20 PM      Component Value Range   Color, Urine YELLOW  YELLOW    APPearance CLEAR  CLEAR    Specific Gravity, Urine <1.005 (*) 1.005 - 1.030    pH 6.0  5.0 - 8.0    Glucose, UA NEGATIVE  NEGATIVE (mg/dL)   Hgb urine dipstick NEGATIVE  NEGATIVE    Bilirubin Urine NEGATIVE  NEGATIVE    Ketones, ur NEGATIVE  NEGATIVE (mg/dL)   Protein, ur NEGATIVE  NEGATIVE (mg/dL)   Urobilinogen, UA 0.2  0.0 - 1.0 (mg/dL)   Nitrite NEGATIVE  NEGATIVE    Leukocytes, UA NEGATIVE  NEGATIVE   WET PREP, GENITAL   Collection Time   06/21/11  5:51 PM  Component Value Range   Yeast Wet Prep HPF POC NONE SEEN  NONE SEEN    Trich, Wet Prep NONE SEEN  NONE SEEN    Clue Cells Wet Prep HPF POC NONE SEEN  NONE SEEN    WBC, Wet Prep HPF POC FEW (*) NONE SEEN    Ferning negative  Imaging Studies:  Not indicated  Assessment/Plan: A: Braxton-hicks contractions  P: D/C home with PTL precautions F/U at [facility]      LEFTWICH-KIRBY, Clerance Umland 2/11/20136:34 PM

## 2011-07-08 ENCOUNTER — Encounter: Payer: Medicaid Other | Admitting: Physician Assistant

## 2011-07-22 ENCOUNTER — Encounter: Payer: Self-pay | Admitting: Obstetrics & Gynecology

## 2011-07-22 ENCOUNTER — Ambulatory Visit (INDEPENDENT_AMBULATORY_CARE_PROVIDER_SITE_OTHER): Payer: Medicaid Other | Admitting: Obstetrics & Gynecology

## 2011-07-22 VITALS — BP 101/69 | Temp 97.0°F | Wt 234.5 lb

## 2011-07-22 DIAGNOSIS — B20 Human immunodeficiency virus [HIV] disease: Secondary | ICD-10-CM

## 2011-07-22 DIAGNOSIS — Z21 Asymptomatic human immunodeficiency virus [HIV] infection status: Secondary | ICD-10-CM

## 2011-07-22 DIAGNOSIS — O98519 Other viral diseases complicating pregnancy, unspecified trimester: Secondary | ICD-10-CM

## 2011-07-22 NOTE — Progress Notes (Signed)
Discussed with patient again that there is no guarantee that the patient can have an all female team.  I had this conversation with them on January 3rd, and patient was given the number for [facility] who only has 2 female providers.  Pt being induced at 38 weeks (EDC 08/23/11 per records in [place]) for HIV (per MFM protocol).  Pt is a VBAC and consent was signed.  Pt accepts risk of uterine rupture, fetal distress, fetal death, maternal hemorrhage needing hysterectomy, blood transfusion, and even death.  This is outlined in the consent.  Pt is aware there are numerous female anesthesiologists and neonatal MDs that will be involved in her care.  She cannot be guaranteed a female only staff.  Both her and her husband seemed surprised at this information although I personally told her and her husband on January 3rd.  We will make all attempts to honor female only provider, but cannot be guaranteed.  Pt needs cultures of the vagina next visit.  Dr. Macon Large, the MD who is all call the day/night of her induction, also spoke with patient today and relayed the same information.

## 2011-07-22 NOTE — Progress Notes (Signed)
U/S 07/26/11 at 245pm. Id Appt. With Dr. Algis Liming 07/26/11 at 1030 am. ROI signed for C/S records from 2001 at [facility] [address]. Patient declined HIV RNA lab draw today.

## 2011-07-22 NOTE — Progress Notes (Signed)
Pulse 74 Having contractions and pelvic pain and pressure.

## 2011-07-26 ENCOUNTER — Ambulatory Visit (INDEPENDENT_AMBULATORY_CARE_PROVIDER_SITE_OTHER): Payer: Medicaid Other | Admitting: Infectious Disease

## 2011-07-26 ENCOUNTER — Ambulatory Visit (HOSPITAL_COMMUNITY)
Admission: RE | Admit: 2011-07-26 | Discharge: 2011-07-26 | Disposition: A | Discharge status: Home / Self Care | Payer: Medicaid Other | Source: Ambulatory Visit | Attending: Obstetrics & Gynecology | Admitting: Obstetrics & Gynecology

## 2011-07-26 ENCOUNTER — Encounter: Payer: Self-pay | Admitting: Infectious Disease

## 2011-07-26 VITALS — BP 109/68 | HR 75 | Temp 97.4°F | Wt 237.0 lb

## 2011-07-26 DIAGNOSIS — B20 Human immunodeficiency virus [HIV] disease: Secondary | ICD-10-CM

## 2011-07-26 DIAGNOSIS — Z21 Asymptomatic human immunodeficiency virus [HIV] infection status: Secondary | ICD-10-CM

## 2011-07-26 DIAGNOSIS — E669 Obesity, unspecified: Secondary | ICD-10-CM

## 2011-07-26 DIAGNOSIS — M549 Dorsalgia, unspecified: Secondary | ICD-10-CM

## 2011-07-26 DIAGNOSIS — Z98891 History of uterine scar from previous surgery: Secondary | ICD-10-CM

## 2011-07-26 DIAGNOSIS — O0990 Supervision of high risk pregnancy, unspecified, unspecified trimester: Secondary | ICD-10-CM

## 2011-07-26 DIAGNOSIS — O98519 Other viral diseases complicating pregnancy, unspecified trimester: Secondary | ICD-10-CM | POA: Insufficient documentation

## 2011-07-26 LAB — CBC WITH DIFFERENTIAL/PLATELET
Basophils Relative: 0 % (ref 0–1)
Eosinophils Absolute: 0 10*3/uL (ref 0.0–0.7)
Eosinophils Relative: 1 % (ref 0–5)
Lymphs Abs: 2 10*3/uL (ref 0.7–4.0)
MCH: 29.2 pg (ref 26.0–34.0)
MCHC: 30.8 g/dL (ref 30.0–36.0)
MCV: 94.8 fL (ref 78.0–100.0)
Platelets: 188 10*3/uL (ref 150–400)
RBC: 3.49 MIL/uL — ABNORMAL LOW (ref 3.87–5.11)
RDW: 16.9 % — ABNORMAL HIGH (ref 11.5–15.5)

## 2011-07-26 LAB — COMPLETE METABOLIC PANEL WITH GFR
ALT: 9 U/L (ref 0–35)
Alkaline Phosphatase: 119 U/L — ABNORMAL HIGH (ref 39–117)
CO2: 21 mEq/L (ref 19–32)
Creat: 0.36 mg/dL — ABNORMAL LOW (ref 0.50–1.10)
GFR, Est African American: >89 mL/min
GFR, Est Non African American: >89 mL/min
Total Bilirubin: 1.4 mg/dL — ABNORMAL HIGH (ref 0.3–1.2)

## 2011-07-26 LAB — URINALYSIS, ROUTINE W REFLEX MICROSCOPIC
Bilirubin Urine: NEGATIVE
Nitrite: NEGATIVE
Specific Gravity, Urine: 1.013 (ref 1.005–1.030)
Urobilinogen, UA: 1 mg/dL (ref 0.0–1.0)

## 2011-07-26 MED ORDER — ATAZANAVIR SULFATE 200 MG PO CAPS: 400.0000 mg | ORAL_CAPSULE | Freq: Every day | ORAL | Status: DC

## 2011-07-26 NOTE — Assessment & Plan Note (Signed)
Needs to see Ob today given her contractions

## 2011-07-26 NOTE — Assessment & Plan Note (Signed)
She feels reyataz is to blame which seems unlikely. She is wanting switch to prezista norvir and truvada after pregnancy and that is reasonable. Will defer to Dr. Drue Second qat followup visit

## 2011-07-26 NOTE — Assessment & Plan Note (Addendum)
Need repeat blood work including repeat viral load and CD4 count. The department of health and human services current guidelines indicate that the patient has a viral load below 400 and is going to be delivering vaginally there is no need for intravenous AZT. However as I informed the patient we need to check and prove that she indeed has a suppressed viral load now.should she go into labor in the absence of such proof perfect suppression I would  Then recommend giving AZT and also considering C section to prevent possible  transmission of the virus to the baby

## 2011-07-26 NOTE — Assessment & Plan Note (Signed)
Check urine.

## 2011-07-26 NOTE — Progress Notes (Signed)
Subjective:    Patient ID: [name] [name], female    DOB: November 28, 1970, 41 y.o.   MRN: [id number]  HPI  41 year old Somali lady with HIV, [redacted] weeks pregnant on 400mg  reyataz, boosted with norvir and also on truvada. She had undetectable viral load in January but no repeat labs since then. She is currently having contractions nearly every hour and also having intermittent rhythmic back pain. She states that she also had a white clear discharge vaginally in the last week. She and her husband expressed frustration with obstetrics because they were told that the patient will require intravenous AZT to require C-section. I explained that if the patient had an undetectable viral load in close proximity to the time of her delivery that AZT intravenously would not be necessary and that a vaginal delivery would be safe. However I also reflected with her and reviewed the labs in the chart and noted that we do not have a recent viral load to prove that she is still with an undetectable viral load. In such circumstances of uncertainly  I would certainly agree with Obstetetrics and Gynecology in pushing for a C-section and for giving intravenous AZT in the absence of proven virological suppression. The patient's spouse is inquiring about possibility of a change to prezista norvir truvada and that would be reasonable after her pregnancies finished. Patient does have his mass reflux and taking times asked her to not take at times at the same time that she is taking her Reyataz Norvir and truvada. Review of Systems  Constitutional: Positive for fatigue. Negative for fever, chills, diaphoresis, activity change, appetite change and unexpected weight change.  HENT: Negative for congestion, sore throat, rhinorrhea, sneezing, trouble swallowing and sinus pressure.   Eyes: Negative for photophobia and visual disturbance.  Respiratory: Negative for cough, chest tightness, shortness of breath, wheezing and stridor.     Cardiovascular: Negative for chest pain, palpitations and leg swelling.  Gastrointestinal: Negative for nausea, vomiting, abdominal pain, diarrhea, constipation, blood in stool, abdominal distention and anal bleeding.  Genitourinary: Positive for vaginal discharge. Negative for dysuria, hematuria, flank pain and difficulty urinating.  Musculoskeletal: Positive for back pain. Negative for myalgias, joint swelling, arthralgias and gait problem.  Skin: Negative for color change, pallor, rash and wound.  Neurological: Negative for dizziness, tremors, weakness and light-headedness.  Hematological: Negative for adenopathy. Does not bruise/bleed easily.  Psychiatric/Behavioral: Negative for behavioral problems, confusion, sleep disturbance, dysphoric mood, decreased concentration and agitation.       Objective:   Physical Exam  Constitutional: She is oriented to person, place, and time. She appears well-developed and well-nourished. No distress.  HENT:  Head: Normocephalic and atraumatic.  Mouth/Throat: Oropharynx is clear and moist. No oropharyngeal exudate.  Eyes: Conjunctivae and EOM are normal. Pupils are equal, round, and reactive to light. No scleral icterus.  Neck: Normal range of motion. Neck supple. No JVD present.  Cardiovascular: Normal rate, regular rhythm and normal heart sounds.  Exam reveals no gallop and no friction rub.   No murmur heard. Pulmonary/Chest: Effort normal and breath sounds normal. No respiratory distress. She has no wheezes. She has no rales. She exhibits no tenderness.  Abdominal: She exhibits no distension and no mass. There is no tenderness. There is no rebound and no guarding.       Gravid positive bowel sounds  Musculoskeletal: She exhibits no edema and no tenderness.  Lymphadenopathy:    She has no cervical adenopathy.  Neurological: She is alert and oriented to person,  place, and time. She has normal reflexes. She exhibits normal muscle tone. Coordination  normal.  Skin: Skin is warm and dry. She is not diaphoretic. No erythema. No pallor.  Psychiatric: She has a normal mood and affect. Her behavior is normal. Judgment and thought content normal.          Assessment & Plan:  HIV (human immunodeficiency virus infection) Need repeat blood work including repeat viral load and CD4 count. The department of health and human services current guidelines indicate that the patient has a viral load below 400 and is going to be delivering vaginally there is no need for intravenous AZT. However as I informed the patient we need to check and prove that she indeed has a suppressed viral load now.should she go into labor in the absence of such proof perfect suppression I would  Then recommend giving AZT and also considering C section to prevent possible  transmission of the virus to the baby  Obesity She feels reyataz is to blame which seems unlikely. She is wanting switch to prezista norvir and truvada after pregnancy and that is reasonable. Will defer to Dr. Drue Second qat followup visit  Back pain Check urine  Pregnancy, supervision for, high-risk Needs to see Ob today given her contractions

## 2011-07-27 ENCOUNTER — Telehealth: Payer: Self-pay | Admitting: *Deleted

## 2011-07-27 NOTE — Telephone Encounter (Signed)
Received call yesterday from Mr. Camera requesting a meeting to discuss their perception of care provided to he and his wife Braelyn Jenson.  I met with the couple yesterday and listened to their concerns.  Ms Jene Every had met with Dr. Daiva Eves at [facility] yesterday and he had explained the change in the guidelines for HIV pregnant patients and AZT administration prior to delivery.  In addition to this concern they both expressed that once they asked for a female provider due to religious reasons they felt as if their other questions and concerns were cast aside and not addressed or even heard.  They voiced concerns about [place] not having received their previous records from [place].  They verbalized that we needed the operative report from her first c-section to determine whether it was safe for her to VBAC.  I explained we did have the records from [place] and they requested a copy of these.  Release of information was obtained and copy given.  In regards to the request for a female provider I explained that while there was no definite guarantee that we have more female providers than female providers so statistically it is more likely they would have a female provider.  The patient states she doesn't care about a female for her epidural or spinal.  States she doesn't want a female to see her private parts as this is against her religion.  I offered to give them a list of coverage for the dates up to her due date so they would know which days there was a female versus a female covering.  Additionally, they expressed concerns as to why Ms. Yussuf was tested repeatedly for GC/Chlamydia.  State they are married and not promiscuous and do not understand why we continue to test for this.  I explained this was best practice and all pregnant patients receive this same testing.  Ms. Jene Every stated she wondered if all pregnant patients received this testing or if it was just HIV patients.  I reinforced again that all  pregnant patients are tested the same for these tests throughout the pregnancy and that it was no different for her because of her diagnosis of HIV.  Mr. Kiedrowski stated he was disappointed in the services they had received.  He said he understood they did not have insurance.  Before he could say anything else I interrupted him and said that we did not care whether he could pay [place] or not.  I assured them both that they would receive excellent care here.  I explained that what they have experienced is not typical of our practice.  They asked questions about the providers and how many c-sections/deliveries they had performed.  I told them that I did not have the exact numbers but that I could guarantee each one had completed a bunch and that sometimes the bedside manner of the provider might not be ideal but that the care provided was excellent!  I requested having time to review the patient's chart and discuss with the providers to see if I could gather additional information for them and to follow-up with a phone call by 12 pm today.  Both were in agreement and the meeting was ended.  Today I spoke with Dr. Jolayne Panther about the concerns the patient and her husband have regarding the changes in the guidelines and their options for delivery.  Dr. Jolayne Panther consulted with Dr. Sherrie George in Maternal Fetal Medicine.  The recommendation was to continue with the current  guidelines of ACOG which state HIV patients should be delivered at 38 weeks and receive AZT prior to delivery.  Dr. Jolayne Panther did say she would continue to look for other resources which might assist with further discussion regarding management options.  I phoned Mr. Korenek at approximately 11:30 am.  We discussed the information I had been able to gather at this point.  I started by explaining who I had spoken with and the information I had been given in regards to the change in guidelines for HIV pregnant patients.  Mr. Jamil did not seem to like the answer  but did seem to understand the rationale behind how guidelines get changed and implemented.  He continued to point out the discrepancy between Dr. Daiva Eves with Infectious Disease and our providers.  I explained that Dr. Jolayne Panther was continuing to look for additional information which might help solidify the new guidelines.  He asked about the results of the ultrasound from yesterday.  I reviewed that with him.  He explained frustration at the difference between this ultrasound and the previous ultrasound.  I explained that the previous ultrasound was scheduled for anatomy which allows more time and the sonographer to point out the different body parts because they are looking at those.  I explained the ultrasound yesterday was only for growth and measurements.  He asked what it showed.  I explained the baby is measuring about 34 weeks and weighs approximately 5 pounds 14 ounces.  I told him ultrasounds have a margin of error so it could be 1 pound more or 1 pound less.  I tried to explain all tests have a margin of error.  He then talked about changing her due date based on the ultrasound showing 34 weeks versus [redacted] week gestational by dating.  I explained the due date doesn't change and that it is based on either Mom's LMP or the early ultrasound because this gives [place] the best measurements.  He asked questions about induction and outcomes and c-section and outcomes.  He also asked if his wife could refuse certain things she didn't want.  I told him she did have the right to refuse anything.  I explained that I was beginning to feel uncomfortable in the questions he was asking and my knowledge to be able to respond appropriately.  I encouraged he discuss these types of questions with the provider on Thursday.  I confirmed the Thursday appointment and changed it from Dr. Debroah Loop (a female) to Dr. Shawnie Pons (a female).  I explained this to Mr. Schmieder.  He thanked me for my time and stated he would explain our discussion to  his wife Ms Jene Every and would be in for their appointment on Thursday.  I told him I would check in with them after the visit on Thursday.    After my discussion with Mr. Bollman, I contacted Dr. Shawnie Pons to explain my conversation so she would be advised of their concerns before seeing the patient on Thursday morning.

## 2011-07-28 LAB — URINE CULTURE: Colony Count: 5000

## 2011-07-28 LAB — HIV-1 RNA QUANT-NO REFLEX-BLD: HIV 1 RNA Quant: <20 copies/mL (ref <20)

## 2011-07-29 ENCOUNTER — Encounter: Payer: Medicaid Other | Admitting: Obstetrics & Gynecology

## 2011-07-29 ENCOUNTER — Ambulatory Visit (INDEPENDENT_AMBULATORY_CARE_PROVIDER_SITE_OTHER): Payer: Medicaid Other | Admitting: Family Medicine

## 2011-07-29 VITALS — BP 97/69 | Temp 97.0°F | Wt 235.5 lb

## 2011-07-29 DIAGNOSIS — D649 Anemia, unspecified: Secondary | ICD-10-CM

## 2011-07-29 DIAGNOSIS — O34219 Maternal care for unspecified type scar from previous cesarean delivery: Secondary | ICD-10-CM

## 2011-07-29 DIAGNOSIS — O98519 Other viral diseases complicating pregnancy, unspecified trimester: Secondary | ICD-10-CM

## 2011-07-29 DIAGNOSIS — Z98891 History of uterine scar from previous surgery: Secondary | ICD-10-CM

## 2011-07-29 DIAGNOSIS — B20 Human immunodeficiency virus [HIV] disease: Secondary | ICD-10-CM

## 2011-07-29 LAB — POCT URINALYSIS DIP (DEVICE)
Bilirubin Urine: NEGATIVE
Glucose, UA: NEGATIVE mg/dL
Hgb urine dipstick: NEGATIVE
Nitrite: NEGATIVE

## 2011-07-29 MED ORDER — DOCUSATE SODIUM 100 MG PO CAPS: 100.0000 mg | ORAL_CAPSULE | Freq: Two times a day (BID) | ORAL | Status: DC | PRN

## 2011-07-29 MED ORDER — FE FUM-VIT C-VIT B12-FA 460-60-0.01-1 MG PO CAPS: 1.0000 | ORAL_CAPSULE | Freq: Every day | ORAL | Status: DC

## 2011-07-29 NOTE — Patient Instructions (Signed)
Contraception Choices Contraception (birth control) is the use of any methods or devices to prevent pregnancy. Below are some methods to help avoid pregnancy. HORMONAL METHODS   Contraceptive implant. This is a thin, plastic tube containing progesterone hormone. It does not contain estrogen hormone. Your caregiver inserts the tube in the inner part of the upper arm. The tube can remain in place for up to 3 years. After 3 years, the implant must be removed. The implant prevents the ovaries from releasing an egg (ovulation), thickens the cervical mucus which prevents sperm from entering the uterus, and thins the lining of the inside of the uterus.   Progesterone-only injections. These injections are given every 3 months by your caregiver to prevent pregnancy. This synthetic progesterone hormone stops the ovaries from releasing eggs. It also thickens cervical mucus and changes the uterine lining. This makes it harder for sperm to survive in the uterus.   Birth control pills. These pills contain estrogen and progesterone hormone. They work by stopping the egg from forming in the ovary (ovulation). Birth control pills are prescribed by a caregiver.Birth control pills can also be used to treat heavy periods.   Minipill. This type of birth control pill contains only the progesterone hormone. They are taken every day of each month and must be prescribed by your caregiver.   Birth control patch. The patch contains hormones similar to those in birth control pills. It must be changed once a week and is prescribed by a caregiver.   Vaginal ring. The ring contains hormones similar to those in birth control pills. It is left in the vagina for 3 weeks, removed for 1 week, and then a new one is put back in place. The patient must be comfortable inserting and removing the ring from the vagina.A caregiver's prescription is necessary.   Emergency contraception. Emergency contraceptives prevent pregnancy after  unprotected sexual intercourse. This pill can be taken right after sex or up to 5 days after unprotected sex. It is most effective the sooner you take the pills after having sexual intercourse. Emergency contraceptive pills are available without a prescription. Check with your pharmacist. Do not use emergency contraception as your only form of birth control.  BARRIER METHODS   Female condom. This is a thin sheath (latex or rubber) that is worn over the penis during sexual intercourse. It can be used with spermicide to increase effectiveness.   Female condom. This is a soft, loose-fitting sheath that is put into the vagina before sexual intercourse.   Diaphragm. This is a soft, latex, dome-shaped barrier that must be fitted by a caregiver. It is inserted into the vagina, along with a spermicidal jelly. It is inserted before intercourse. The diaphragm should be left in the vagina for 6 to 8 hours after intercourse.   Cervical cap. This is a round, soft, latex or plastic cup that fits over the cervix and must be fitted by a caregiver. The cap can be left in place for up to 48 hours after intercourse.   Sponge. This is a soft, circular piece of polyurethane foam. The sponge has spermicide in it. It is inserted into the vagina after wetting it and before sexual intercourse.   Spermicides. These are chemicals that kill or block sperm from entering the cervix and uterus. They come in the form of creams, jellies, suppositories, foam, or tablets. They do not require a prescription. They are inserted into the vagina with an applicator before having sexual intercourse. The process must be   repeated every time you have sexual intercourse.  INTRAUTERINE CONTRACEPTION  Intrauterine device (IUD). This is a T-shaped device that is put in a woman's uterus during a menstrual period to prevent pregnancy. There are 2 types:   Copper IUD. This type of IUD is wrapped in copper wire and is placed inside the uterus. Copper  makes the uterus and fallopian tubes produce a fluid that kills sperm. It can stay in place for 10 years.   Hormone IUD. This type of IUD contains the hormone progestin (synthetic progesterone). The hormone thickens the cervical mucus and prevents sperm from entering the uterus, and it also thins the uterine lining to prevent implantation of a fertilized egg. The hormone can weaken or kill the sperm that get into the uterus. It can stay in place for 5 years.  PERMANENT METHODS OF CONTRACEPTION  Female tubal ligation. This is when the woman's fallopian tubes are surgically sealed, tied, or blocked to prevent the egg from traveling to the uterus.   Female sterilization. This is when the female has the tubes that carry sperm tied off (vasectomy).This blocks sperm from entering the vagina during sexual intercourse. After the procedure, the man can still ejaculate fluid (semen).  NATURAL PLANNING METHODS  Natural family planning. This is not having sexual intercourse or using a barrier method (condom, diaphragm, cervical cap) on days the woman could become pregnant.   Calendar method. This is keeping track of the length of each menstrual cycle and identifying when you are fertile.   Ovulation method. This is avoiding sexual intercourse during ovulation.   Symptothermal method. This is avoiding sexual intercourse during ovulation, using a thermometer and ovulation symptoms.   Post-ovulation method. This is timing sexual intercourse after you have ovulated.  Regardless of which type or method of contraception you choose, it is important that you use condoms to protect against the transmission of sexually transmitted diseases (STDs). Talk with your caregiver about which form of contraception is most appropriate for you. Document Released: 04/26/2005 Document Revised: 04/15/2011 Document Reviewed: 09/02/2010 [facility] Patient Information 2012 [place], [place]. Postpartum Depression and Baby Blues The  postpartum period begins right after the birth of a baby. During this time, there is often a great amount of joy and excitement. It is also a time of considerable changes in the life of the parent(s). Regardless of how many times a mother gives birth, each child brings new challenges and dynamics to the family. It is not unusual to have feelings of excitement accompanied by confusing shifts in moods, emotions, and thoughts. All mothers are at risk of developing postpartum depression or the "baby blues." These mood changes can occur right after giving birth, or they may occur many months after giving birth. The baby blues or postpartum depression can be mild or severe. Additionally, postpartum depression can resolve rather quickly, or it can be a long-term condition. CAUSES Elevated hormones and their rapid decline are thought to be a main cause of postpartum depression and the baby blues. There are a number of hormones that radically change during and after pregnancy. Estrogen and progesterone usually decrease immediately after delivering your baby. The level of thyroid hormone and various cortisol steroids also rapidly drop. Other factors that play a major role in these changes include major life events and genetics.  RISK FACTORS If you have any of the following risks for the baby blues or postpartum depression, know what symptoms to watch out for during the postpartum period. Risk factors that may increase  the likelihood of getting the baby blues or postpartum depression include:  Havinga personal or family history of depression.   Having depression while being pregnant.   Having premenstrual or oral contraceptive-associated mood issues.   Having exceptional life stress.   Having marital conflict.   Lacking a social support network.   Having a baby with special needs.   Having health problems such as diabetes.  SYMPTOMS Baby blues symptoms include:  Brief fluctuations in mood, such as  going from extreme happiness to sadness.   Decreased concentration.   Difficulty sleeping.   Crying spells, tearfulness.   Irritability.   Anxiety.  Postpartum depression symptoms typically begin within the first month after giving birth. These symptoms include:  Difficulty sleeping or excessive sleepiness.   Marked weight loss.   Agitation.   Feelings of worthlessness.   Lack of interest in activity or food.  Postpartum psychosis is a very concerning condition and can be dangerous. Fortunately, it is rare. Displaying any of the following symptoms is cause for immediate medical attention. Postpartum psychosis symptoms include:  Hallucinations and delusions.   Bizarre or disorganized behavior.   Confusion or disorientation.  DIAGNOSIS  A diagnosis is made by an evaluation of your symptoms. There are no medical or lab tests that lead to a diagnosis, but there are various questionnaires that a caregiver may use to identify those with the baby blues, postpartum depression, or psychosis. Often times, a screening tool called the [place] Postnatal Depression Scale is used to diagnose depression in the postpartum period.  TREATMENT The baby blues usually goes away on its own in 1 to 2 weeks. Social support is often all that is needed. You should be encouraged to get adequate sleep and rest. Occasionally, you may be given medicines to help you sleep.  Postpartum depression requires treatment as it can last several months or longer if it is not treated. Treatment may include individual or group therapy, medicine, or both to address any social, physiological, and psychological factors that may play a role in the depression. Regular exercise, a healthy diet, rest, and social support may also be strongly recommended.  Postpartum psychosis is more serious and needs treatment right away. Hospitalization is often needed. HOME CARE INSTRUCTIONS  Get as much rest as you can. Nap when the baby  sleeps.   Exercise regularly. Some women find yoga and walking to be beneficial.   Eat a balanced and nourishing diet.   Do little things that you enjoy. Have a cup of tea, take a bubble bath, read your favorite magazine, or listen to your favorite music.   Avoid alcohol.   Ask for help with household chores, cooking, grocery shopping, or running errands as needed. Do not try to do everything.   Talk to people close to you about how you are feeling. Get support from your partner, family members, friends, or other new moms.   Try to stay positive in how you think. Think about the things you are grateful for.   Do not spend a lot of time alone.   Only take medicine as directed by your caregiver.   Keep all your postpartum appointments.   Let your caregiver know if you have any concerns.  SEEK MEDICAL CARE IF: You are having a reaction or problems with your medicine. SEEK IMMEDIATE MEDICAL CARE IF:  You have suicidal feelings.   You feel you may harm the baby or someone else.  Document Released: 01/29/2004 Document Revised: 04/15/2011 Document Reviewed:  03/02/2011 [employer] Patient Information 2012 [place], [place].

## 2011-07-29 NOTE — Progress Notes (Signed)
Pulse 87. Pain in lower back. Pressure in pelvic.

## 2011-07-29 NOTE — Progress Notes (Signed)
Has decided for ERLTCS.  U/S shows 52%, vtx, nml fluid GBS today

## 2011-07-30 NOTE — Progress Notes (Signed)
Patient's c-section has been scheduled for 08/18/11 with Dr. Shawnie Pons at 10:30 am.

## 2011-07-30 NOTE — OR Nursing (Signed)
This pt wants to have a female provider . So her surgery date was changed to accommodate her req.

## 2011-08-01 LAB — CULTURE, BETA STREP (GROUP B ONLY)

## 2011-08-02 ENCOUNTER — Ambulatory Visit (INDEPENDENT_AMBULATORY_CARE_PROVIDER_SITE_OTHER): Payer: Medicaid Other | Admitting: Obstetrics and Gynecology

## 2011-08-02 VITALS — BP 100/67 | Temp 97.0°F | Wt 235.6 lb

## 2011-08-02 DIAGNOSIS — O34219 Maternal care for unspecified type scar from previous cesarean delivery: Secondary | ICD-10-CM

## 2011-08-02 DIAGNOSIS — O0990 Supervision of high risk pregnancy, unspecified, unspecified trimester: Secondary | ICD-10-CM

## 2011-08-02 DIAGNOSIS — B20 Human immunodeficiency virus [HIV] disease: Secondary | ICD-10-CM

## 2011-08-02 DIAGNOSIS — E669 Obesity, unspecified: Secondary | ICD-10-CM

## 2011-08-02 DIAGNOSIS — O98519 Other viral diseases complicating pregnancy, unspecified trimester: Secondary | ICD-10-CM

## 2011-08-02 DIAGNOSIS — O09529 Supervision of elderly multigravida, unspecified trimester: Secondary | ICD-10-CM

## 2011-08-02 DIAGNOSIS — Z21 Asymptomatic human immunodeficiency virus [HIV] infection status: Secondary | ICD-10-CM

## 2011-08-02 DIAGNOSIS — Z98891 History of uterine scar from previous surgery: Secondary | ICD-10-CM

## 2011-08-02 LAB — POCT URINALYSIS DIP (DEVICE)
Bilirubin Urine: NEGATIVE
Glucose, UA: NEGATIVE mg/dL
Protein, ur: NEGATIVE mg/dL
Urobilinogen, UA: 2 mg/dL — ABNORMAL HIGH (ref 0.0–1.0)
pH: 7 (ref 5.0–8.0)

## 2011-08-02 NOTE — Progress Notes (Signed)
Pulse 88 Pt is confused about when she is supposed to have her csection. She has been told 3 different dates. Also wants to know if she needs to come on Thursday for her preop appt.

## 2011-08-02 NOTE — Progress Notes (Signed)
Patient doing well without complaints. FM/Labor precautions reviewed. Date and time of c-section reviewed. Patient scheduled 08/18/2011 with Dr. Shawnie Pons at 10:15am.

## 2011-08-03 ENCOUNTER — Other Ambulatory Visit: Payer: Self-pay | Admitting: Obstetrics & Gynecology

## 2011-08-03 ENCOUNTER — Encounter (HOSPITAL_COMMUNITY): Payer: Self-pay

## 2011-08-05 ENCOUNTER — Encounter: Payer: Medicaid Other | Admitting: Obstetrics & Gynecology

## 2011-08-05 ENCOUNTER — Encounter (HOSPITAL_COMMUNITY)
Admission: RE | Admit: 2011-08-05 | Discharge: 2011-08-05 | Disposition: A | Discharge status: Home / Self Care | Payer: Medicaid Other | Source: Ambulatory Visit | Attending: Obstetrics and Gynecology | Admitting: Obstetrics and Gynecology

## 2011-08-05 ENCOUNTER — Encounter (HOSPITAL_COMMUNITY): Payer: Self-pay

## 2011-08-05 HISTORY — DX: Anemia, unspecified: D64.9

## 2011-08-05 NOTE — Pre-Procedure Instructions (Signed)
Reviewed with Dr Lyanne Co use labs from 07/26/11.

## 2011-08-05 NOTE — Patient Instructions (Addendum)
   Your procedure is scheduled on: Wednesday April 10th  Enter through the [employer] [facility] at: [employer] up the phone at the desk and dial [phone] and inform us of your arrival.  Please call this number if you have any problems the morning of surgery: [phone]  Remember: Do not eat food after midnight:Tuesday Do not drink clear liquids after: midnight Tuesday Take these medicines the morning of surgery with a SIP OF WATER: morning medications  Do not wear jewelry, make-up, or FINGER nail polish Do not wear lotions, powders, perfumes or deodorant. Do not shave 48 hours prior to surgery. Do not bring valuables to the hospital. Contacts, dentures or bridgework may not be worn into surgery.  Leave suitcase in the car. After Surgery it may be brought to your room. For pati   Remember to use your hibiclens as instructed.Please shower with 1/2 bottle the evening before your surgery and the other 1/2 bottle the morning of surgery. Neck down avoiding private area.

## 2011-08-09 ENCOUNTER — Encounter: Payer: Self-pay | Admitting: Family Medicine

## 2011-08-09 ENCOUNTER — Encounter: Payer: Medicaid Other | Admitting: Obstetrics & Gynecology

## 2011-08-09 ENCOUNTER — Ambulatory Visit (INDEPENDENT_AMBULATORY_CARE_PROVIDER_SITE_OTHER): Payer: Medicaid Other | Admitting: Family Medicine

## 2011-08-09 VITALS — BP 110/69 | Wt 235.7 lb

## 2011-08-09 DIAGNOSIS — O98519 Other viral diseases complicating pregnancy, unspecified trimester: Secondary | ICD-10-CM

## 2011-08-09 DIAGNOSIS — O0990 Supervision of high risk pregnancy, unspecified, unspecified trimester: Secondary | ICD-10-CM

## 2011-08-09 DIAGNOSIS — B20 Human immunodeficiency virus [HIV] disease: Secondary | ICD-10-CM

## 2011-08-09 LAB — POCT URINALYSIS DIP (DEVICE)
Bilirubin Urine: NEGATIVE
Ketones, ur: NEGATIVE mg/dL
Protein, ur: NEGATIVE mg/dL
Specific Gravity, Urine: 1.025 (ref 1.005–1.030)
pH: 7 (ref 5.0–8.0)

## 2011-08-09 NOTE — Patient Instructions (Signed)
Pregnancy - Third Trimester The third trimester of pregnancy (the last 3 months) is a period of the most rapid growth for you and your baby. The baby approaches a length of 20 inches and a weight of 6 to 10 pounds. The baby is adding on fat and getting ready for life outside your body. While inside, babies have periods of sleeping and waking, suck their thumbs, and hiccups. You can often feel small contractions of the uterus. This is false labor. It is also called Braxton-Hicks contractions. This is like a practice for labor. The usual problems in this stage of pregnancy include more difficulty breathing, swelling of the hands and feet from water retention, and having to urinate more often because of the uterus and baby pressing on your bladder.  PRENATAL EXAMS  Blood work may continue to be done during prenatal exams. These tests are done to check on your health and the probable health of your baby. Blood work is used to follow your blood levels (hemoglobin). Anemia (low hemoglobin) is common during pregnancy. Iron and vitamins are given to help prevent this. You may also continue to be checked for diabetes. Some of the past blood tests may be done again.   The size of the uterus is measured during each visit. This makes sure your baby is growing properly according to your pregnancy dates.   Your blood pressure is checked every prenatal visit. This is to make sure you are not getting toxemia.   Your urine is checked every prenatal visit for infection, diabetes and protein.   Your weight is checked at each visit. This is done to make sure gains are happening at the suggested rate and that you and your baby are growing normally.   Sometimes, an ultrasound is performed to confirm the position and the proper growth and development of the baby. This is a test done that bounces harmless sound waves off the baby so your caregiver can more accurately determine due dates.   Discuss the type of pain  medication and anesthesia you will have during your labor and delivery.   Discuss the possibility and anesthesia if a Cesarean Section might be necessary.   Inform your caregiver if there is any mental or physical violence at home.  Sometimes, a specialized non-stress test, contraction stress test and biophysical profile are done to make sure the baby is not having a problem. Checking the amniotic fluid surrounding the baby is called an amniocentesis. The amniotic fluid is removed by sticking a needle into the belly (abdomen). This is sometimes done near the end of pregnancy if an early delivery is required. In this case, it is done to help make sure the baby's lungs are mature enough for the baby to live outside of the womb. If the lungs are not mature and it is unsafe to deliver the baby, an injection of cortisone medication is given to the mother 1 to 2 days before the delivery. This helps the baby's lungs mature and makes it safer to deliver the baby. CHANGES OCCURING IN THE THIRD TRIMESTER OF PREGNANCY Your body goes through many changes during pregnancy. They vary from person to person. Talk to your caregiver about changes you notice and are concerned about.  During the last trimester, you have probably had an increase in your appetite. It is normal to have cravings for certain foods. This varies from person to person and pregnancy to pregnancy.   You may begin to get stretch marks on your hips,   abdomen, and breasts. These are normal changes in the body during pregnancy. There are no exercises or medications to take which prevent this change.   Constipation may be treated with a stool softener or adding bulk to your diet. Drinking lots of fluids, fiber in vegetables, fruits, and whole grains are helpful.   Exercising is also helpful. If you have been very active up until your pregnancy, most of these activities can be continued during your pregnancy. If you have been less active, it is helpful  to start an exercise program such as walking. Consult your caregiver before starting exercise programs.   Avoid all smoking, alcohol, un-prescribed drugs, herbs and "street drugs" during your pregnancy. These chemicals affect the formation and growth of the baby. Avoid chemicals throughout the pregnancy to ensure the delivery of a healthy infant.   Backache, varicose veins and hemorrhoids may develop or get worse.   You will tire more easily in the third trimester, which is normal.   The baby's movements may be stronger and more often.   You may become short of breath easily.   Your belly button may stick out.   A yellow discharge may leak from your breasts called colostrum.   You may have a bloody mucus discharge. This usually occurs a few days to a week before labor begins.  HOME CARE INSTRUCTIONS   Keep your caregiver's appointments. Follow your caregiver's instructions regarding medication use, exercise, and diet.   During pregnancy, you are providing food for you and your baby. Continue to eat regular, well-balanced meals. Choose foods such as meat, fish, milk and other low fat dairy products, vegetables, fruits, and whole-grain breads and cereals. Your caregiver will tell you of the ideal weight gain.   A physical sexual relationship may be continued throughout pregnancy if there are no other problems such as early (premature) leaking of amniotic fluid from the membranes, vaginal bleeding, or belly (abdominal) pain.   Exercise regularly if there are no restrictions. Check with your caregiver if you are unsure of the safety of your exercises. Greater weight gain will occur in the last 2 trimesters of pregnancy. Exercising helps:   Control your weight.   Get you in shape for labor and delivery.   You lose weight after you deliver.   Rest a lot with legs elevated, or as needed for leg cramps or low back pain.   Wear a good support or jogging bra for breast tenderness during  pregnancy. This may help if worn during sleep. Pads or tissues may be used in the bra if you are leaking colostrum.   Do not use hot tubs, steam rooms, or saunas.   Wear your seat belt when driving. This protects you and your baby if you are in an accident.   Avoid raw meat, cat litter boxes and soil used by cats. These carry germs that can cause birth defects in the baby.   It is easier to loose urine during pregnancy. Tightening up and strengthening the pelvic muscles will help with this problem. You can practice stopping your urination while you are going to the bathroom. These are the same muscles you need to strengthen. It is also the muscles you would use if you were trying to stop from passing gas. You can practice tightening these muscles up 10 times a set and repeating this about 3 times per day. Once you know what muscles to tighten up, do not perform these exercises during urination. It is more likely   to cause an infection by backing up the urine.   Ask for help if you have financial, counseling or nutritional needs during pregnancy. Your caregiver will be able to offer counseling for these needs as well as refer you for other special needs.   Make a list of emergency phone numbers and have them available.   Plan on getting help from family or friends when you go home from the hospital.   Make a trial run to the hospital.   Take prenatal classes with the father to understand, practice and ask questions about the labor and delivery.   Prepare the baby's room/nursery.   Do not travel out of the city unless it is absolutely necessary and with the advice of your caregiver.   Wear only low or no heal shoes to have better balance and prevent falling.  MEDICATIONS AND DRUG USE IN PREGNANCY  Take prenatal vitamins as directed. The vitamin should contain 1 milligram of folic acid. Keep all vitamins out of reach of children. Only a couple vitamins or tablets containing iron may be fatal  to a baby or young child when ingested.   Avoid use of all medications, including herbs, over-the-counter medications, not prescribed or suggested by your caregiver. Only take over-the-counter or prescription medicines for pain, discomfort, or fever as directed by your caregiver. Do not use aspirin, ibuprofen (Motrin, Advil, Nuprin) or naproxen (Aleve) unless OK'd by your caregiver.   Let your caregiver also know about herbs you may be using.   Alcohol is related to a number of birth defects. This includes fetal alcohol syndrome. All alcohol, in any form, should be avoided completely. Smoking will cause low birth rate and premature babies.   Street/illegal drugs are very harmful to the baby. They are absolutely forbidden. A baby born to an addicted mother will be addicted at birth. The baby will go through the same withdrawal an adult does.  SEEK MEDICAL CARE IF: You have any concerns or worries during your pregnancy. It is better to call with your questions if you feel they cannot wait, rather than worry about them. DECISIONS ABOUT CIRCUMCISION You may or may not know the sex of your baby. If you know your baby is a boy, it may be time to think about circumcision. Circumcision is the removal of the foreskin of the penis. This is the skin that covers the sensitive end of the penis. There is no proven medical need for this. Often this decision is made on what is popular at the time or based upon religious beliefs and social issues. You can discuss these issues with your caregiver or pediatrician. SEEK IMMEDIATE MEDICAL CARE IF:   An unexplained oral temperature above 102 F (38.9 C) develops, or as your caregiver suggests.   You have leaking of fluid from the vagina (birth canal). If leaking membranes are suspected, take your temperature and tell your caregiver of this when you call.   There is vaginal spotting, bleeding or passing clots. Tell your caregiver of the amount and how many pads are  used.   You develop a bad smelling vaginal discharge with a change in the color from clear to white.   You develop vomiting that lasts more than 24 hours.   You develop chills or fever.   You develop shortness of breath.   You develop burning on urination.   You loose more than 2 pounds of weight or gain more than 2 pounds of weight or as suggested by your   caregiver.   You notice sudden swelling of your face, hands, and feet or legs.   You develop belly (abdominal) pain. Round ligament discomfort is a common non-cancerous (benign) cause of abdominal pain in pregnancy. Your caregiver still must evaluate you.   You develop a severe headache that does not go away.   You develop visual problems, blurred or double vision.   If you have not felt your baby move for more than 1 hour. If you think the baby is not moving as much as usual, eat something with sugar in it and lie down on your left side for an hour. The baby should move at least 4 to 5 times per hour. Call right away if your baby moves less than that.   You fall, are in a car accident or any kind of trauma.   There is mental or physical violence at home.  Document Released: 04/20/2001 Document Revised: 04/15/2011 Document Reviewed: 10/23/2008 [employer] Patient Information 2012 [employer].  Contraception Choices Contraception (birth control) is the use of any methods or devices to prevent pregnancy. Below are some methods to help avoid pregnancy. HORMONAL METHODS   Contraceptive implant. This is a thin, plastic tube containing progesterone hormone. It does not contain estrogen hormone. Your caregiver inserts the tube in the inner part of the upper arm. The tube can remain in place for up to 3 years. After 3 years, the implant must be removed. The implant prevents the ovaries from releasing an egg (ovulation), thickens the cervical mucus which prevents sperm from entering the uterus, and thins the lining of the inside of the  uterus.   Progesterone-only injections. These injections are given every 3 months by your caregiver to prevent pregnancy. This synthetic progesterone hormone stops the ovaries from releasing eggs. It also thickens cervical mucus and changes the uterine lining. This makes it harder for sperm to survive in the uterus.   Birth control pills. These pills contain estrogen and progesterone hormone. They work by stopping the egg from forming in the ovary (ovulation). Birth control pills are prescribed by a caregiver.Birth control pills can also be used to treat heavy periods.   Minipill. This type of birth control pill contains only the progesterone hormone. They are taken every day of each month and must be prescribed by your caregiver.   Birth control patch. The patch contains hormones similar to those in birth control pills. It must be changed once a week and is prescribed by a caregiver.   Vaginal ring. The ring contains hormones similar to those in birth control pills. It is left in the vagina for 3 weeks, removed for 1 week, and then a new one is put back in place. The patient must be comfortable inserting and removing the ring from the vagina.A caregiver's prescription is necessary.   Emergency contraception. Emergency contraceptives prevent pregnancy after unprotected sexual intercourse. This pill can be taken right after sex or up to 5 days after unprotected sex. It is most effective the sooner you take the pills after having sexual intercourse. Emergency contraceptive pills are available without a prescription. Check with your pharmacist. Do not use emergency contraception as your only form of birth control.  BARRIER METHODS   Female condom. This is a thin sheath (latex or rubber) that is worn over the penis during sexual intercourse. It can be used with spermicide to increase effectiveness.   Female condom. This is a soft, loose-fitting sheath that is put into the vagina before sexual    intercourse.   Diaphragm. This is a soft, latex, dome-shaped barrier that must be fitted by a caregiver. It is inserted into the vagina, along with a spermicidal jelly. It is inserted before intercourse. The diaphragm should be left in the vagina for 6 to 8 hours after intercourse.   Cervical cap. This is a round, soft, latex or plastic cup that fits over the cervix and must be fitted by a caregiver. The cap can be left in place for up to 48 hours after intercourse.   Sponge. This is a soft, circular piece of polyurethane foam. The sponge has spermicide in it. It is inserted into the vagina after wetting it and before sexual intercourse.   Spermicides. These are chemicals that kill or block sperm from entering the cervix and uterus. They come in the form of creams, jellies, suppositories, foam, or tablets. They do not require a prescription. They are inserted into the vagina with an applicator before having sexual intercourse. The process must be repeated every time you have sexual intercourse.  INTRAUTERINE CONTRACEPTION  Intrauterine device (IUD). This is a T-shaped device that is put in a woman's uterus during a menstrual period to prevent pregnancy. There are 2 types:   Copper IUD. This type of IUD is wrapped in copper wire and is placed inside the uterus. Copper makes the uterus and fallopian tubes produce a fluid that kills sperm. It can stay in place for 10 years.   Hormone IUD. This type of IUD contains the hormone progestin (synthetic progesterone). The hormone thickens the cervical mucus and prevents sperm from entering the uterus, and it also thins the uterine lining to prevent implantation of a fertilized egg. The hormone can weaken or kill the sperm that get into the uterus. It can stay in place for 5 years.  PERMANENT METHODS OF CONTRACEPTION  Female tubal ligation. This is when the woman's fallopian tubes are surgically sealed, tied, or blocked to prevent the egg from traveling to  the uterus.   Female sterilization. This is when the female has the tubes that carry sperm tied off (vasectomy).This blocks sperm from entering the vagina during sexual intercourse. After the procedure, the man can still ejaculate fluid (semen).  NATURAL PLANNING METHODS  Natural family planning. This is not having sexual intercourse or using a barrier method (condom, diaphragm, cervical cap) on days the woman could become pregnant.   Calendar method. This is keeping track of the length of each menstrual cycle and identifying when you are fertile.   Ovulation method. This is avoiding sexual intercourse during ovulation.   Symptothermal method. This is avoiding sexual intercourse during ovulation, using a thermometer and ovulation symptoms.   Post-ovulation method. This is timing sexual intercourse after you have ovulated.  Regardless of which type or method of contraception you choose, it is important that you use condoms to protect against the transmission of sexually transmitted diseases (STDs). Talk with your caregiver about which form of contraception is most appropriate for you. Document Released: 04/26/2005 Document Revised: 04/15/2011 Document Reviewed: 09/02/2010 [employer] Patient Information 2012 [employer]. 

## 2011-08-09 NOTE — Progress Notes (Signed)
Reports excellent movement.  We are on schedule for AZT with C-S at 39 wks.

## 2011-08-09 NOTE — Progress Notes (Signed)
Pelvic pressure. Four days ago pt states had spotting. Pulse 92.

## 2011-08-16 ENCOUNTER — Encounter: Payer: Medicaid Other | Admitting: Obstetrics & Gynecology

## 2011-08-16 NOTE — H&P (Signed)
[name] [name] is an 41 y.o. [phone] [redacted]w[redacted]d female.   Chief Complaint: Previous C-section, elective repeat HPI:  Here for Elective Repeat C-section at 39 + wks.  Pt. Is HIV +.  Has been on treatment with undetectable viral loads.  Past Medical History  Diagnosis Date  . Anxiety   . Preterm labor   . HIV (human immunodeficiency virus infection)   . Blood transfusion 1997    contracted HIV from transfusion  . Anemia     Past Surgical History  Procedure Date  . Cesarean section 04/2000    No family history on file. Social History:  reports that she has never smoked. She has never used smokeless tobacco. She reports that she does not drink alcohol or use illicit drugs.  Allergies:  Allergies  Allergen Reactions  . Latex Rash    No current facility-administered medications on file as of .   Medications Prior to Admission  Medication Sig Dispense Refill  . atazanavir (REYATAZ) 200 MG capsule Take 2 capsules (400 mg total) by mouth daily with breakfast.  60 capsule  3  . docusate sodium (COLACE) 100 MG capsule Take 100 mg by mouth 2 (two) times daily as needed. For constipation      . emtricitabine-tenofovir (TRUVADA) 200-300 MG per tablet Take 1 tablet by mouth daily.  30 tablet  3  . ferrous sulfate 325 (65 FE) MG tablet Take 325 mg by mouth daily with breakfast.      . Prenatal Vit-Fe Fumarate-FA (PRENATAL MULTIVITAMIN) 60-1 MG tablet Take 1 tablet by mouth daily.  30 tablet  6  . ritonavir (NORVIR) 100 MG capsule Take 1 capsule (100 mg total) by mouth daily.  30 capsule  3     A comprehensive review of systems was negative.  Last menstrual period 11/22/2010. LMP 11/22/2010 General appearance: alert, cooperative and appears stated age Eyes: negative findings: conjunctivae and sclerae normal Neck: no adenopathy, supple, symmetrical, trachea midline and thyroid not enlarged, symmetric, no tenderness/mass/nodules Lungs: clear to auscultation bilaterally Heart: regular rate  and rhythm, S1, S2 normal, no murmur, click, rub or gallop Abdomen: soft, non-tender; bowel sounds normal; no masses,  no organomegaly Extremities: extremities normal, atraumatic, no cyanosis or edema Skin: Skin color, texture, turgor normal. No rashes or lesions Neurologic: Grossly normal   Lab Results  Component Value Date   WBC 7.3 07/26/2011   HGB 10.2* 07/26/2011   HCT 33.1* 07/26/2011   MCV 94.8 07/26/2011   PLT 188 07/26/2011     Dating Summary       Working EDD: 08/24/11 set by Reva Bores, MD on 08/09/11 based on Ultrasound on 01/26/11       Based On  EDD  GA Dif  Comments  GA  Cyc  Lut  BCP  Entered By  Date      Last Menstrual Period on 11/22/10  08/29/11  -5d       Reva Bores, MD  08/09/11     Ultrasound on 01/26/11  08/24/11  Working   [redacted]w[redacted]d     Reva Bores, MD  08/09/11       OB History         Grav  Para  Term  Preterm  Abortions  TAB  SAB  Ect  Mult  Living      6  3  2  1  2   2    3       #  Outcome  Date  GA  Labor/2nd  Weight  Sex  Delivery  Anes  PTL  Living  Name  Location  MD      1  SAB  05/1992                 2  Term  07/1993  [redacted]w[redacted]d   9 lb (4.082 kg)   SVD  None     [facility]       Comments: no complications     3  Term  01/1995  [redacted]w[redacted]d   8 lb (3.629 kg)   SVD  None  No    [facility]       4  SAB  08/1995                 5  Preterm  04/2000  [redacted]w[redacted]d   5 lb (2.268 kg)   CS  EPI   Yes   [place]       Comments: felll one week before delivery, FHR dropped     6  Current                          Blood Type/Antibody Screening  Ref. Range  Date  Time       ABO ([facility])  * O   02/18/11        RH ([facility])  * Positive   03/21/11        ABO/RH ([facility]) DO NOT USE FOR MANUAL ENTRY           Antibody Screen  * Negative   02/17/11           1st Trimester  Ref. Range  Date  Time       HGB           HCT           Platelets           Rubella  * Immune   02/17/11        Varicella           RPR           HBsAg  NEGATIVE   05/13/11  1501      HIV             Gonorrhea           Chlamydia           GBS (Screen)           GBS (Culture Only)           TSH           Urine Culture           TB           Pap           UDS              2nd Trimester  Ref. Range  Date  Time       HGB  11.0 g/dL (L)  16.1 - 09.6 g/dL  04/54/09  8119      HCT  34.8 % (L)  36.0 - 46.0 %  05/13/11  1501      Platelets  241 K/uL  150 - 400 K/uL  05/13/11  1501      HIV           RPR  NON REAC  NON REAC  05/13/11  1501  Gonorrhea  * Negative   02/17/11        Chlamydia  * Negative   02/17/11        GBS (Screen)           GBS (Culture Only)           TSH           Fetal Fibronectin           UDS           Urine Culture             Assessment/Plan Patient Active Problem List  Diagnoses  . HIV (human immunodeficiency virus infection)  . AMA (advanced maternal age) multigravida 35+  . History of C-section  . Pregnancy, supervision for, high-risk  . Obesity  . Back pain   Plan for repeat C-Section, IV AZT  Aurora Rody S 08/16/2011, 11:54 AM

## 2011-08-17 MED ORDER — CEFAZOLIN SODIUM-DEXTROSE 2-3 GM-% IV SOLR
2.0000 g | INTRAVENOUS | Status: AC
  Administered 2011-08-18: 2 g via INTRAVENOUS
  Filled 2011-08-17: qty 50

## 2011-08-18 ENCOUNTER — Encounter (HOSPITAL_COMMUNITY)
Admission: RE | Disposition: A | Discharge status: Home / Self Care | Payer: Self-pay | Source: Ambulatory Visit | Attending: Family Medicine

## 2011-08-18 ENCOUNTER — Inpatient Hospital Stay (HOSPITAL_COMMUNITY)
Admission: RE | Admit: 2011-08-18 | Discharge: 2011-08-20 | DRG: 765 | Disposition: A | Discharge status: Home / Self Care | Payer: Medicaid Other | Source: Ambulatory Visit | Attending: Family Medicine | Admitting: Family Medicine

## 2011-08-18 ENCOUNTER — Inpatient Hospital Stay (HOSPITAL_COMMUNITY): Payer: Medicaid Other | Admitting: Anesthesiology

## 2011-08-18 ENCOUNTER — Encounter (HOSPITAL_COMMUNITY): Payer: Self-pay | Admitting: Anesthesiology

## 2011-08-18 ENCOUNTER — Encounter (HOSPITAL_COMMUNITY): Payer: Self-pay | Admitting: *Deleted

## 2011-08-18 DIAGNOSIS — O34219 Maternal care for unspecified type scar from previous cesarean delivery: Principal | ICD-10-CM | POA: Diagnosis present

## 2011-08-18 DIAGNOSIS — Z21 Asymptomatic human immunodeficiency virus [HIV] infection status: Secondary | ICD-10-CM

## 2011-08-18 DIAGNOSIS — O98519 Other viral diseases complicating pregnancy, unspecified trimester: Secondary | ICD-10-CM | POA: Diagnosis present

## 2011-08-18 DIAGNOSIS — B20 Human immunodeficiency virus [HIV] disease: Secondary | ICD-10-CM

## 2011-08-18 DIAGNOSIS — O09529 Supervision of elderly multigravida, unspecified trimester: Secondary | ICD-10-CM

## 2011-08-18 DIAGNOSIS — Z01818 Encounter for other preprocedural examination: Secondary | ICD-10-CM

## 2011-08-18 DIAGNOSIS — Z01812 Encounter for preprocedural laboratory examination: Secondary | ICD-10-CM

## 2011-08-18 SURGERY — Surgical Case
Anesthesia: Spinal | Site: Abdomen | Wound class: Clean Contaminated

## 2011-08-18 MED ORDER — OXYTOCIN 20 UNITS IN LACTATED RINGERS INFUSION - SIMPLE
INTRAVENOUS | Status: DC | PRN
  Administered 2011-08-18: 20 [IU] via INTRAVENOUS

## 2011-08-18 MED ORDER — DIPHENHYDRAMINE HCL 25 MG PO CAPS: 25.0000 mg | ORAL_CAPSULE | ORAL | Status: DC | PRN

## 2011-08-18 MED ORDER — OXYTOCIN 20 UNITS IN LACTATED RINGERS INFUSION - SIMPLE
INTRAVENOUS | Status: AC
  Administered 2011-08-18: 125 mL/h via INTRAVENOUS
  Filled 2011-08-18: qty 1000

## 2011-08-18 MED ORDER — SCOPOLAMINE 1 MG/3DAYS TD PT72
1.0000 | MEDICATED_PATCH | Freq: Once | TRANSDERMAL | Status: DC
  Administered 2011-08-18: 1.5 mg via TRANSDERMAL

## 2011-08-18 MED ORDER — KETOROLAC TROMETHAMINE 60 MG/2ML IM SOLN
INTRAMUSCULAR | Status: AC
  Filled 2011-08-18: qty 2

## 2011-08-18 MED ORDER — ZOLPIDEM TARTRATE 5 MG PO TABS: 5.0000 mg | ORAL_TABLET | Freq: Every evening | ORAL | Status: DC | PRN

## 2011-08-18 MED ORDER — DIPHENHYDRAMINE HCL 50 MG/ML IJ SOLN: 25.0000 mg | INTRAMUSCULAR | Status: DC | PRN

## 2011-08-18 MED ORDER — ONDANSETRON HCL 4 MG/2ML IJ SOLN: 4.0000 mg | INTRAMUSCULAR | Status: DC | PRN

## 2011-08-18 MED ORDER — PHENYLEPHRINE 40 MCG/ML (10ML) SYRINGE FOR IV PUSH (FOR BLOOD PRESSURE SUPPORT)
PREFILLED_SYRINGE | INTRAVENOUS | Status: AC
  Filled 2011-08-18: qty 5

## 2011-08-18 MED ORDER — SIMETHICONE 80 MG PO CHEW: 80.0000 mg | CHEWABLE_TABLET | ORAL | Status: DC | PRN

## 2011-08-18 MED ORDER — IBUPROFEN 600 MG PO TABS
600.0000 mg | ORAL_TABLET | Freq: Four times a day (QID) | ORAL | Status: DC | PRN
  Filled 2011-08-18 (×6): qty 1

## 2011-08-18 MED ORDER — 0.9 % SODIUM CHLORIDE (POUR BTL) OPTIME
TOPICAL | Status: DC | PRN
  Administered 2011-08-18: 1000 mL

## 2011-08-18 MED ORDER — KETOROLAC TROMETHAMINE 60 MG/2ML IM SOLN: 60.0000 mg | Freq: Once | INTRAMUSCULAR | Status: DC | PRN

## 2011-08-18 MED ORDER — DIPHENHYDRAMINE HCL 25 MG PO CAPS: 25.0000 mg | ORAL_CAPSULE | Freq: Four times a day (QID) | ORAL | Status: DC | PRN

## 2011-08-18 MED ORDER — EMTRICITABINE-TENOFOVIR DF 200-300 MG PO TABS
1.0000 | ORAL_TABLET | Freq: Every day | ORAL | Status: DC
  Filled 2011-08-18 (×2): qty 1

## 2011-08-18 MED ORDER — IBUPROFEN 600 MG PO TABS
600.0000 mg | ORAL_TABLET | Freq: Four times a day (QID) | ORAL | Status: DC
  Administered 2011-08-18 – 2011-08-20 (×8): 600 mg via ORAL
  Filled 2011-08-18 (×2): qty 1

## 2011-08-18 MED ORDER — KETOROLAC TROMETHAMINE 30 MG/ML IJ SOLN
INTRAMUSCULAR | Status: AC
  Administered 2011-08-18: 30 mg
  Filled 2011-08-18: qty 1

## 2011-08-18 MED ORDER — KETOROLAC TROMETHAMINE 30 MG/ML IJ SOLN: 30.0000 mg | Freq: Four times a day (QID) | INTRAMUSCULAR | Status: AC | PRN

## 2011-08-18 MED ORDER — NALBUPHINE HCL 10 MG/ML IJ SOLN
5.0000 mg | INTRAMUSCULAR | Status: DC | PRN
  Administered 2011-08-18: 10 mg via SUBCUTANEOUS
  Filled 2011-08-18: qty 1

## 2011-08-18 MED ORDER — RITONAVIR 100 MG PO CAPS
100.0000 mg | ORAL_CAPSULE | Freq: Every day | ORAL | Status: DC
  Administered 2011-08-18 – 2011-08-19 (×2): 100 mg via ORAL
  Filled 2011-08-18 (×3): qty 1

## 2011-08-18 MED ORDER — MENTHOL 3 MG MT LOZG: 1.0000 | LOZENGE | OROMUCOSAL | Status: DC | PRN

## 2011-08-18 MED ORDER — WITCH HAZEL-GLYCERIN EX PADS: 1.0000 "application " | MEDICATED_PAD | CUTANEOUS | Status: DC | PRN

## 2011-08-18 MED ORDER — OXYCODONE-ACETAMINOPHEN 5-325 MG PO TABS: 1.0000 | ORAL_TABLET | ORAL | Status: DC | PRN

## 2011-08-18 MED ORDER — ATAZANAVIR SULFATE 200 MG PO CAPS
400.0000 mg | ORAL_CAPSULE | Freq: Every day | ORAL | Status: DC
  Administered 2011-08-19 (×2): 400 mg via ORAL
  Filled 2011-08-18: qty 2

## 2011-08-18 MED ORDER — OXYTOCIN 20 UNITS IN LACTATED RINGERS INFUSION - SIMPLE
125.0000 mL/h | INTRAVENOUS | Status: AC
  Administered 2011-08-18: 125 mL/h via INTRAVENOUS

## 2011-08-18 MED ORDER — PHENYLEPHRINE HCL 10 MG/ML IJ SOLN
INTRAMUSCULAR | Status: DC | PRN
  Administered 2011-08-18: 80 ug via INTRAVENOUS
  Administered 2011-08-18: 40 ug via INTRAVENOUS
  Administered 2011-08-18 (×2): 80 ug via INTRAVENOUS

## 2011-08-18 MED ORDER — MORPHINE SULFATE (PF) 0.5 MG/ML IJ SOLN
INTRAMUSCULAR | Status: DC | PRN
  Administered 2011-08-18: .1 mg via INTRATHECAL

## 2011-08-18 MED ORDER — EMTRICITABINE-TENOFOVIR DF 200-300 MG PO TABS: 1.0000 | ORAL_TABLET | Freq: Every day | ORAL | Status: DC

## 2011-08-18 MED ORDER — FERROUS SULFATE 325 (65 FE) MG PO TABS
325.0000 mg | ORAL_TABLET | Freq: Every day | ORAL | Status: DC
  Administered 2011-08-19 – 2011-08-20 (×2): 325 mg via ORAL
  Filled 2011-08-18 (×2): qty 1

## 2011-08-18 MED ORDER — LACTATED RINGERS IV SOLN
INTRAVENOUS | Status: DC | PRN
  Administered 2011-08-18 (×3): via INTRAVENOUS

## 2011-08-18 MED ORDER — MORPHINE SULFATE 0.5 MG/ML IJ SOLN
INTRAMUSCULAR | Status: AC
  Filled 2011-08-18: qty 10

## 2011-08-18 MED ORDER — ZIDOVUDINE 10 MG/ML IV SOLN
2.0000 mg/kg | Freq: Once | INTRAVENOUS | Status: AC
  Administered 2011-08-18: 213 mg via INTRAVENOUS
  Administered 2011-08-18: 1 mg via INTRAVENOUS
  Filled 2011-08-18: qty 21.3

## 2011-08-18 MED ORDER — SIMETHICONE 80 MG PO CHEW
80.0000 mg | CHEWABLE_TABLET | Freq: Three times a day (TID) | ORAL | Status: DC
  Administered 2011-08-18 – 2011-08-20 (×8): 80 mg via ORAL

## 2011-08-18 MED ORDER — PRENATAL MULTIVITAMIN CH
1.0000 | ORAL_TABLET | Freq: Every day | ORAL | Status: DC
  Administered 2011-08-18 – 2011-08-20 (×3): 1 via ORAL
  Filled 2011-08-18 (×3): qty 1

## 2011-08-18 MED ORDER — FENTANYL CITRATE 0.05 MG/ML IJ SOLN
INTRAMUSCULAR | Status: AC
  Filled 2011-08-18: qty 2

## 2011-08-18 MED ORDER — ONDANSETRON HCL 4 MG/2ML IJ SOLN
4.0000 mg | Freq: Once | INTRAMUSCULAR | Status: AC
  Administered 2011-08-18: 4 mg via INTRAVENOUS

## 2011-08-18 MED ORDER — SENNOSIDES-DOCUSATE SODIUM 8.6-50 MG PO TABS
2.0000 | ORAL_TABLET | Freq: Every day | ORAL | Status: DC
  Administered 2011-08-18 – 2011-08-19 (×2): 2 via ORAL

## 2011-08-18 MED ORDER — NALBUPHINE HCL 10 MG/ML IJ SOLN
5.0000 mg | INTRAMUSCULAR | Status: DC | PRN
  Filled 2011-08-18: qty 1

## 2011-08-18 MED ORDER — BUPIVACAINE HCL (PF) 0.25 % IJ SOLN
INTRAMUSCULAR | Status: AC
  Filled 2011-08-18: qty 30

## 2011-08-18 MED ORDER — TETANUS-DIPHTH-ACELL PERTUSSIS 5-2.5-18.5 LF-MCG/0.5 IM SUSP: 0.5000 mL | Freq: Once | INTRAMUSCULAR | Status: DC

## 2011-08-18 MED ORDER — NALOXONE HCL 0.4 MG/ML IJ SOLN: 0.4000 mg | INTRAMUSCULAR | Status: DC | PRN

## 2011-08-18 MED ORDER — FENTANYL CITRATE 0.05 MG/ML IJ SOLN: 25.0000 ug | INTRAMUSCULAR | Status: DC | PRN

## 2011-08-18 MED ORDER — EMTRICITABINE-TENOFOVIR DF 200-300 MG PO TABS
1.0000 | ORAL_TABLET | Freq: Every day | ORAL | Status: DC
  Administered 2011-08-18 – 2011-08-19 (×2): 1 via ORAL
  Filled 2011-08-18 (×3): qty 1

## 2011-08-18 MED ORDER — NALBUPHINE SYRINGE 5 MG/0.5 ML
INJECTION | INTRAMUSCULAR | Status: AC
  Administered 2011-08-18: 10 mg via SUBCUTANEOUS
  Filled 2011-08-18: qty 1

## 2011-08-18 MED ORDER — MEPERIDINE HCL 25 MG/ML IJ SOLN: 6.2500 mg | INTRAMUSCULAR | Status: DC | PRN

## 2011-08-18 MED ORDER — BUPIVACAINE HCL (PF) 0.25 % IJ SOLN
INTRAMUSCULAR | Status: DC | PRN
  Administered 2011-08-18: 30 mL

## 2011-08-18 MED ORDER — SCOPOLAMINE 1 MG/3DAYS TD PT72
1.0000 | MEDICATED_PATCH | Freq: Once | TRANSDERMAL | Status: DC
  Filled 2011-08-18: qty 1

## 2011-08-18 MED ORDER — BUPIVACAINE IN DEXTROSE 0.75-8.25 % IT SOLN
INTRATHECAL | Status: DC | PRN
  Administered 2011-08-18: 1.5 mg via INTRATHECAL

## 2011-08-18 MED ORDER — LANOLIN HYDROUS EX OINT: 1.0000 "application " | TOPICAL_OINTMENT | CUTANEOUS | Status: DC | PRN

## 2011-08-18 MED ORDER — ZIDOVUDINE 10 MG/ML IV SOLN
1.0000 mg/kg/h | INTRAVENOUS | Status: DC
  Administered 2011-08-18: 1 mg/kg/h via INTRAVENOUS
  Filled 2011-08-18 (×2): qty 40

## 2011-08-18 MED ORDER — FENTANYL CITRATE 0.05 MG/ML IJ SOLN
INTRAMUSCULAR | Status: DC | PRN
  Administered 2011-08-18: 25 ug via INTRAVENOUS
  Administered 2011-08-18: 35 ug via INTRAVENOUS
  Administered 2011-08-18: 12.5 ug via INTRAVENOUS
  Administered 2011-08-18: 15 ug via INTRATHECAL
  Administered 2011-08-18: 12.5 ug via INTRAVENOUS

## 2011-08-18 MED ORDER — ONDANSETRON HCL 4 MG/2ML IJ SOLN
INTRAMUSCULAR | Status: DC | PRN
  Administered 2011-08-18: 4 mg via INTRAVENOUS

## 2011-08-18 MED ORDER — DIBUCAINE 1 % RE OINT: 1.0000 "application " | TOPICAL_OINTMENT | RECTAL | Status: DC | PRN

## 2011-08-18 MED ORDER — RITONAVIR 100 MG PO CAPS
100.0000 mg | ORAL_CAPSULE | Freq: Every day | ORAL | Status: DC
  Filled 2011-08-18 (×2): qty 1

## 2011-08-18 MED ORDER — NALOXONE HCL 0.4 MG/ML IJ SOLN
1.0000 ug/kg/h | INTRAMUSCULAR | Status: DC | PRN
  Filled 2011-08-18: qty 2.5

## 2011-08-18 MED ORDER — ONDANSETRON HCL 4 MG/2ML IJ SOLN
INTRAMUSCULAR | Status: AC
  Administered 2011-08-18: 4 mg via INTRAVENOUS
  Filled 2011-08-18: qty 2

## 2011-08-18 MED ORDER — SCOPOLAMINE 1 MG/3DAYS TD PT72
MEDICATED_PATCH | TRANSDERMAL | Status: AC
  Administered 2011-08-18: 1.5 mg via TRANSDERMAL
  Filled 2011-08-18: qty 1

## 2011-08-18 MED ORDER — DIPHENHYDRAMINE HCL 50 MG/ML IJ SOLN: 12.5000 mg | INTRAMUSCULAR | Status: DC | PRN

## 2011-08-18 MED ORDER — EPHEDRINE 5 MG/ML INJ
INTRAVENOUS | Status: AC
  Filled 2011-08-18: qty 10

## 2011-08-18 MED ORDER — LACTATED RINGERS IV SOLN
INTRAVENOUS | Status: DC
  Administered 2011-08-18: 20:00:00 via INTRAVENOUS

## 2011-08-18 MED ORDER — METOCLOPRAMIDE HCL 5 MG/ML IJ SOLN: 10.0000 mg | Freq: Three times a day (TID) | INTRAMUSCULAR | Status: DC | PRN

## 2011-08-18 MED ORDER — SODIUM CHLORIDE 0.9 % IJ SOLN: 3.0000 mL | INTRAMUSCULAR | Status: DC | PRN

## 2011-08-18 MED ORDER — ONDANSETRON HCL 4 MG/2ML IJ SOLN: 4.0000 mg | Freq: Three times a day (TID) | INTRAMUSCULAR | Status: DC | PRN

## 2011-08-18 MED ORDER — LACTATED RINGERS IV SOLN
INTRAVENOUS | Status: DC
  Administered 2011-08-18: 07:00:00 via INTRAVENOUS

## 2011-08-18 MED ORDER — ONDANSETRON HCL 4 MG PO TABS: 4.0000 mg | ORAL_TABLET | ORAL | Status: DC | PRN

## 2011-08-18 SURGICAL SUPPLY — 31 items
CHLORAPREP W/TINT 26ML (MISCELLANEOUS) ×2 IMPLANT
CLOTH BEACON ORANGE TIMEOUT ST (SAFETY) ×2 IMPLANT
DRESSING TELFA 8X3 (GAUZE/BANDAGES/DRESSINGS) ×2 IMPLANT
ELECT REM PT RETURN 9FT ADLT (ELECTROSURGICAL) ×2
ELECTRODE REM PT RTRN 9FT ADLT (ELECTROSURGICAL) ×1 IMPLANT
EXTRACTOR VACUUM M CUP 4 TUBE (SUCTIONS) IMPLANT
GAUZE SPONGE 4X4 12PLY STRL LF (GAUZE/BANDAGES/DRESSINGS) ×2 IMPLANT
GLOVE BIOGEL PI IND STRL 7.0 (GLOVE) ×1 IMPLANT
GLOVE BIOGEL PI INDICATOR 7.0 (GLOVE) ×1
GLOVE ECLIPSE 7.0 STRL STRAW (GLOVE) IMPLANT
GLOVE SURG SS PI 7.0 STRL IVOR (GLOVE) ×6 IMPLANT
GLOVE SURG SS PI 7.5 STRL IVOR (GLOVE) ×2 IMPLANT
GOWN PREVENTION PLUS LG XLONG (DISPOSABLE) ×4 IMPLANT
GOWN PREVENTION PLUS XLARGE (GOWN DISPOSABLE) ×2 IMPLANT
KIT ABG SYR 3ML LUER SLIP (SYRINGE) IMPLANT
NEEDLE HYPO 22GX1.5 SAFETY (NEEDLE) ×2 IMPLANT
NEEDLE HYPO 25X5/8 SAFETYGLIDE (NEEDLE) IMPLANT
NS IRRIG 1000ML POUR BTL (IV SOLUTION) ×2 IMPLANT
PACK C SECTION WH (CUSTOM PROCEDURE TRAY) ×2 IMPLANT
PAD ABD 7.5X8 STRL (GAUZE/BANDAGES/DRESSINGS) ×2 IMPLANT
RTRCTR C-SECT PINK 25CM LRG (MISCELLANEOUS) ×2 IMPLANT
SLEEVE SCD COMPRESS KNEE MED (MISCELLANEOUS) IMPLANT
STAPLER VISISTAT 35W (STAPLE) IMPLANT
SUT VIC AB 0 CTX 36 (SUTURE) ×3
SUT VIC AB 0 CTX36XBRD ANBCTRL (SUTURE) ×3 IMPLANT
SUT VIC AB 4-0 KS 27 (SUTURE) IMPLANT
SYR 30ML LL (SYRINGE) ×2 IMPLANT
TAPE CLOTH SURG 4X10 WHT LF (GAUZE/BANDAGES/DRESSINGS) ×2 IMPLANT
TOWEL OR 17X24 6PK STRL BLUE (TOWEL DISPOSABLE) ×4 IMPLANT
TRAY FOLEY CATH 14FR (SET/KITS/TRAYS/PACK) ×2 IMPLANT
WATER STERILE IRR 1000ML POUR (IV SOLUTION) ×2 IMPLANT

## 2011-08-18 NOTE — Op Note (Signed)
[name] [name] PROCEDURE DATE: 08/18/2011  PREOPERATIVE DIAGNOSIS: Intrauterine pregnancy at  [redacted]w[redacted]d weeks gestation; elective repeat  POSTOPERATIVE DIAGNOSIS: The same  PROCEDURE: Repeat Low Transverse Cesarean Section  SURGEON:  Dr. Tinnie Gens  ASSISTANT: Dr Candelaria Celeste  INDICATIONS: [name] [name] is a 41 y.o. [id number] at [redacted]w[redacted]d scheduled for cesarean section secondary to Elective Repeat.  The risks of cesarean section discussed with the patient included but were not limited to: bleeding which may require transfusion or reoperation; infection which may require antibiotics; injury to bowel, bladder, ureters or other surrounding organs; injury to the fetus; need for additional procedures including hysterectomy in the event of a life-threatening hemorrhage; placental abnormalities wth subsequent pregnancies, incisional problems, thromboembolic phenomenon and other postoperative/anesthesia complications. The patient concurred with the proposed plan, giving informed written consent for the procedure.    FINDINGS:  Viable female infant in cephalic presentation.  Apgars 9 and 9.  See delivery summary for weight.  Clear amniotic fluid.  Intact placenta, three vessel cord.  Normal uterus, left fallopian tube and ovary.  Right fallopian tube and ovary not visualized due to adhesions.  ANESTHESIA:    Spinal INTRAVENOUS FLUIDS:2700 ml ESTIMATED BLOOD LOSS: 800 ml URINE OUTPUT:  200 ml SPECIMENS: Placenta sent to L&D COMPLICATIONS: None immediate  PROCEDURE IN DETAIL:  The patient received intravenous antibiotics and had sequential compression devices applied to her lower extremities while in the preoperative area.  She was then taken to the operating room where spinal anesthesia was administered and was found to be adequate. She was then placed in a dorsal supine position with a leftward tilt, and prepped and draped in a sterile manner.  A foley catheter was placed into her bladder and attached to constant  gravity, which drained clear fluid throughout.  After an adequate timeout was performed, a Pfannenstiel skin incision was made with scalpel and carried through to the underlying layer of fascia. The fascia was incised in the midline and this incision was extended bilaterally bluntly. The underlying rectus muscles were dissected off bluntly.  The rectus muscles were then separated in the midline bluntly and the peritoneum was entered bluntly. An Alexis retractor was placed.  Attention was turned to the lower uterine segment where a transverse hysterotomy was made with a scalpel and extended bilaterally bluntly. The infant was successfully delivered, and cord was clamped and cut and infant was handed over to awaiting neonatology team. Uterine massage was then administered and the placenta delivered intact with three-vessel cord. The uterus was then cleared of clot and debris.  The hysterotomy was closed with 0 Vicryl in a running locked fashion, and an imbricating layer was also placed with a 0 Vicryl. Overall, excellent hemostasis was noted. The abdomen and the pelvis were cleared of all clot and debris. Hemostasis was confirmed on all surfaces.  The fascia was then closed using 0 Vicryl in a running fashion.  The subcutaneous layer was reapproximated with 0 Vicryl and the skin was closed with 4-0 Vicryl. The patient tolerated the procedure well. Sponge, lap, instrument and needle counts were correct x 2. She was taken to the recovery room in stable condition.    Candelaria Celeste JEHIEL DO 08/18/2011 11:45 AM

## 2011-08-18 NOTE — Anesthesia Procedure Notes (Signed)
Spinal  Patient location during procedure: OR Start time: 08/18/2011 10:49 AM Staffing Performed by: other anesthesia staff  Preanesthetic Checklist Completed: patient identified, site marked, surgical consent, pre-op evaluation, timeout performed, IV checked, risks and benefits discussed and monitors and equipment checked Spinal Block Patient position: sitting Prep: site prepped and draped and DuraPrep Patient monitoring: heart rate, cardiac monitor, continuous pulse ox and blood pressure Approach: midline Location: L3-4 Injection technique: single-shot Needle Needle type: Sprotte  Needle gauge: 24 G Needle length: 9 cm Assessment Sensory level: T4 Additional Notes Clear free flow CSF on first attempt by SRNA under my supervision.  No paresthesia.  Patient tolerated procedure well.  Mervyn Skeeters Cassidy< MD

## 2011-08-18 NOTE — Interval H&P Note (Signed)
History and Physical Interval Note:  08/18/2011 9:39 AM  [name] [name]  has presented today for surgery, with the diagnosis of previous cesarean section  The various methods of treatment have been discussed with the patient and family. After consideration of risks, benefits and other options for treatment, the patient has consented to  Procedure(s) (LRB): CESAREAN SECTION (N/A) as a surgical intervention .  The patients' history has been reviewed, patient examined, no change in status, stable for surgery.  I have reviewed the patients' chart and labs.  Questions were answered to the patient's satisfaction.     Emitt Maglione S

## 2011-08-18 NOTE — Anesthesia Postprocedure Evaluation (Signed)
  Anesthesia Post-op Note  Anesthesia Post Note  Patient: [name] [name]  Procedure(s) Performed: Procedure(s) (LRB): CESAREAN SECTION (N/A)  Anesthesia type: Spinal  Patient location: Mother/Baby  Post pain: Pain level controlled  Post assessment: Post-op Vital signs reviewed  Last Vitals:  Filed Vitals:   08/18/11 1640  BP: 98/58  Pulse: 54  Temp: 36.5 C  Resp: 16    Post vital signs: Reviewed  Level of consciousness: awake  Complications: No apparent anesthesia complications

## 2011-08-18 NOTE — Transfer of Care (Signed)
Immediate Anesthesia Transfer of Care Note  Patient: [name] [name]  Procedure(s) Performed: Procedure(s) (LRB): CESAREAN SECTION (N/A)  Patient Location: PACU  Anesthesia Type: Spinal  Level of Consciousness: awake, alert , oriented and patient cooperative  Airway & Oxygen Therapy: Patient Spontanous Breathing  Post-op Assessment: Report given to PACU RN and Post -op Vital signs reviewed and stable  Post vital signs: Reviewed and stable  Complications: No apparent anesthesia complications

## 2011-08-18 NOTE — Anesthesia Preprocedure Evaluation (Addendum)
Anesthesia Evaluation  Patient identified by MRN, date of birth, ID band Patient awake    Reviewed: Allergy & Precautions, H&P , NPO status , Patient's Chart, lab work & pertinent test results, reviewed documented beta blocker date and time   History of Anesthesia Complications Negative for: history of anesthetic complications  Airway Mallampati: I TM Distance: >3 FB Neck ROM: full    Dental  (+) Teeth Intact   Pulmonary neg pulmonary ROS,  breath sounds clear to auscultation        Cardiovascular negative cardio ROS  Rhythm:regular Rate:Normal     Neuro/Psych Back pain negative psych ROS   GI/Hepatic Neg liver ROS, Nausea today with AZT infusion   Endo/Other  Morbid obesity  Renal/GU negative Renal ROS  negative genitourinary   Musculoskeletal   Abdominal   Peds  Hematology  (+) 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),   Anesthesia Other Findings   Reproductive/Obstetrics (+) Pregnancy (svd x1, c/s x1 for repeat)                          Anesthesia Physical Anesthesia Plan  ASA: III  Anesthesia Plan: Spinal   Post-op Pain Management:    Induction:   Airway Management Planned:   Additional Equipment:   Intra-op Plan:   Post-operative Plan:   Informed Consent: I have reviewed the patients History and Physical, chart, labs and discussed the procedure including the risks, benefits and alternatives for the proposed anesthesia with the patient or authorized representative who has indicated his/her understanding and acceptance.     Plan Discussed with: Surgeon and CRNA  Anesthesia Plan Comments:         Anesthesia Quick Evaluation

## 2011-08-18 NOTE — Consult Note (Signed)
Called to attend repeat C/S at term gestation with h/o maternal HIV positive.  No other risk factors reported by Dr. Shawnie Pons.  Delivery was not observed secondary to religious objections of the parents. It is believed that infant had a spontaneous cry following delivery and subsequently the infant was noted to have spontaneous movement of all extremities.  She was given tactile stimulation with drying and bulb suction to naso/oropharynx yielding copious clear fluid.  There were no dysmorphic features. Apgar scores were 9/9 at one and five minutes respectively.   Infant was shown to mother and father and carried into the OR by a female respiratory therapist and then father carried infant to the Transitional Nursery.   Dagoberto Ligas MD [facility]

## 2011-08-19 ENCOUNTER — Encounter (HOSPITAL_COMMUNITY): Payer: Self-pay | Admitting: Family Medicine

## 2011-08-19 ENCOUNTER — Telehealth: Payer: Self-pay | Admitting: *Deleted

## 2011-08-19 LAB — CBC
HCT: 27.2 % — ABNORMAL LOW (ref 36.0–46.0)
MCH: 29.8 pg (ref 26.0–34.0)
MCHC: 32.4 g/dL (ref 30.0–36.0)
RDW: 17.8 % — ABNORMAL HIGH (ref 11.5–15.5)

## 2011-08-19 MED ORDER — ACETAMINOPHEN-CODEINE #3 300-30 MG PO TABS
1.0000 | ORAL_TABLET | ORAL | Status: DC | PRN
  Administered 2011-08-19: 1 via ORAL
  Filled 2011-08-19: qty 1

## 2011-08-19 MED ORDER — FENTANYL CITRATE 0.05 MG/ML IJ SOLN
50.0000 ug | Freq: Once | INTRAMUSCULAR | Status: AC
  Administered 2011-08-19: 50 ug via INTRAVENOUS
  Filled 2011-08-19: qty 1

## 2011-08-19 MED ORDER — ACETAMINOPHEN-CODEINE #3 300-30 MG PO TABS
1.0000 | ORAL_TABLET | ORAL | Status: DC | PRN
  Administered 2011-08-19 (×2): 1 via ORAL
  Administered 2011-08-19: 2 via ORAL
  Filled 2011-08-19 (×2): qty 1
  Filled 2011-08-19: qty 2

## 2011-08-19 NOTE — Progress Notes (Signed)
UR chart review completed.  

## 2011-08-19 NOTE — Progress Notes (Signed)
Pt has had increased pain today.  At 0915 this am pt c/o of pain at a 9 on pain scale and stated that percocet made her "spacey".  I called the resident on call and asked for tylenol #3 because that is what pt requested.  At 1015 still no order so called again and finally got order for tylenol #3.  Pt received medication at 1155 for Pain of 9-10.  Also gave scheduled motrin at 1215 pt will with a pain of 10.  Saw Dr. Adrian Blackwater on floor around 1300 and asked about giving pt something stronger to get her pain level down so that it could be better managed.  He modified the tylenol #3 order and ordered for a one time dose of IV fentanyl.  Pt received the fentanyl around 1409 with a pain level of 10.  At 1500 her pain level had come down to a 3 per the pt.  Will continue to monitor pts pain level and continue to give the ordered pain medication.
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
## 2011-08-19 NOTE — Progress Notes (Signed)
Subjective: Postpartum Day 1: Cesarean Delivery Patient reports no problems voiding. Patient ambulated without difficulty. Patient denies chest pain, shortness of breath, nausea or emesis.  Objective: Vital signs in last 24 hours: Temp:  [97.1 F (36.2 C)-98.2 F (36.8 C)] 98.2 F (36.8 C) (04/11 0620) Pulse Rate:  [52-80] 61  (04/11 0620) Resp:  [16-18] 18  (04/11 0620) BP: (83-106)/(45-73) 84/58 mmHg (04/11 0620) SpO2:  [95 %-100 %] 98 % (04/11 0136)  Physical Exam:  General: alert, cooperative and no distress Lochia: appropriate Uterine Fundus: firm, NT Incision: healing well, no significant drainage, no significant erythema DVT Evaluation: No evidence of DVT seen on physical exam. No significant calf/ankle edema.   Basename 08/19/11 0525  HGB 8.8*  HCT 27.2*    Assessment/Plan: Status post Cesarean section. Doing well postoperatively.  Continue current care.  Caydyn Sprung 08/19/2011, 8:40 AM
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
## 2011-08-19 NOTE — Telephone Encounter (Signed)
Ted[facility] from [facility] called to see what pt had to do to get her meds. States the pink medicaid runs out 30 days after delivery? She delivered yesterday. I told her about ADAP. Told her she has to make an appt & bring financial papers to apply. This may take 2-3 weeks. should make an appt with Britta Mccreedy now. I gave her that number. I asked that she find out if she has "pink" medicaid or regular. If it is regular, her visits & meds will be paid for.  She will be out of meds sometime next month

## 2011-08-20 ENCOUNTER — Other Ambulatory Visit: Payer: Self-pay | Admitting: Licensed Clinical Social Worker

## 2011-08-20 DIAGNOSIS — B20 Human immunodeficiency virus [HIV] disease: Secondary | ICD-10-CM

## 2011-08-20 MED ORDER — IBUPROFEN 600 MG PO TABS: 600.0000 mg | ORAL_TABLET | Freq: Four times a day (QID) | ORAL | Status: AC | PRN

## 2011-08-20 MED ORDER — FLUOXETINE HCL 20 MG PO CAPS: 20.0000 mg | ORAL_CAPSULE | Freq: Every day | ORAL | Status: DC

## 2011-08-20 MED ORDER — DARUNAVIR ETHANOLATE 800 MG PO TABS: 800.0000 mg | ORAL_TABLET | Freq: Every day | ORAL | Status: DC

## 2011-08-20 MED ORDER — OXYCODONE-ACETAMINOPHEN 5-325 MG PO TABS
1.0000 | ORAL_TABLET | Freq: Four times a day (QID) | ORAL | Status: DC | PRN
  Administered 2011-08-20: 2 via ORAL
  Administered 2011-08-20: 1 via ORAL
  Filled 2011-08-20: qty 1
  Filled 2011-08-20: qty 2

## 2011-08-20 MED ORDER — OXYCODONE-ACETAMINOPHEN 5-325 MG PO TABS: 1.0000 | ORAL_TABLET | ORAL | Status: AC | PRN

## 2011-08-20 NOTE — Progress Notes (Signed)
Late documentation:  Following cesarean delivery of infant, I called Dr Orvan Falconer from ID and discussed management of patient following delivery, who recommended continuing with prenatal therapy.  [name] [name] 08/20/2011 9:00 AM

## 2011-08-20 NOTE — Discharge Summary (Signed)
Obstetric Discharge Summary Reason for Admission: cesarean section Prenatal Procedures: ultrasound Intrapartum Procedures: cesarean: low cervical, transverse Postpartum Procedures: none Complications-Operative and Postpartum: none  Seen by SW yesterday. Reported depression, wanting to isolate herself and sleep all the time throughout the pregnancy. Worried that is will get worse after having a baby. Denies SI/HI. Interested in counseling and antidepressants. Hemoglobin  Date Value Range Status  08/19/2011 8.8* 12.0-15.0 (g/dL) Final     HCT  Date Value Range Status  08/19/2011 27.2* 36.0-46.0 (%) Final    Physical Exam:  General: alert, cooperative and no distress Lochia: appropriate Uterine Fundus: firm Incision: healing well, no significant drainage, no dehiscence, no significant erythema DVT Evaluation: Negative Homan's sign. Calf/Ankle edema is present.  Discharge Diagnoses: Term Pregnancy-delivered and HIV pos, depression  Discharge Information: Date: 08/20/2011 Activity: pelvic rest and advance activity slowly.  Diet: routine and increase dietary iron Medications: Ibuprofen, Colace, Iron and Percocet, Prozac. Continue HIV meds at doses used in pregnancy. Medication List  As of 08/20/2011 11:11 AM   STOP taking these medications         prenatal multivitamin 60-1 MG tablet         TAKE these medications         Darunavir Ethanolate 800 MG tablet   Commonly known as: PREZISTA   Take 1 tablet (800 mg total) by mouth daily with breakfast.      docusate sodium 100 MG capsule   Commonly known as: COLACE   Take 100 mg by mouth 2 (two) times daily as needed. For constipation      emtricitabine-tenofovir 200-300 MG per tablet   Commonly known as: TRUVADA   Take 1 tablet by mouth daily.      ferrous sulfate 325 (65 FE) MG tablet   Take 325 mg by mouth daily with breakfast.      FLUoxetine 20 MG capsule   Commonly known as: PROZAC   Take 1 capsule (20 mg total) by  mouth daily.      oxyCODONE-acetaminophen 5-325 MG per tablet   Commonly known as: PERCOCET   Take 1 tablet by mouth every 4 (four) hours as needed for pain.      ritonavir 100 MG capsule   Commonly known as: NORVIR   Take 1 capsule (100 mg total) by mouth daily.           Condition: stable Instructions: refer to practice specific booklet Discharge to: home Make appointment w/ counselor. Contact info given by SW.  Follow-up Information    Follow up with [facility] in 4 weeks.      Follow up with [facility] in 6 weeks.   Contact information:   [address] [place] [address]       Follow up with [facility]. (As needed)    Contact information:   [address] [place] [address] [phone]        Newborn Data: Live born female  Birth Weight: 6 lb 4.7 oz (2855 g) APGAR: 9, 9 Newborn has appointment to F/U w/ Peds and ID.  Home with mother.  [name] [name] 08/20/2011, 11:01 AM

## 2011-08-24 NOTE — Discharge Summary (Signed)
Attestation of Attending Supervision of Advanced Practitioner: Evaluation and management procedures were performed by the OB Fellow/PA/CNM/NP under my supervision and collaboration. Chart reviewed, and agree with management and plan.  Lugene Beougher, M.D. 08/24/2011 11:26 AM   

## 2011-09-15 ENCOUNTER — Ambulatory Visit: Payer: Medicaid Other | Admitting: Advanced Practice Midwife

## 2011-09-16 ENCOUNTER — Ambulatory Visit: Payer: Medicaid Other | Admitting: Physician Assistant

## 2011-09-27 ENCOUNTER — Ambulatory Visit: Payer: Medicaid Other | Admitting: Infectious Disease

## 2011-09-27 ENCOUNTER — Telehealth: Payer: Self-pay | Admitting: *Deleted

## 2011-09-27 ENCOUNTER — Ambulatory Visit (INDEPENDENT_AMBULATORY_CARE_PROVIDER_SITE_OTHER): Payer: Medicaid Other | Admitting: Family Medicine

## 2011-09-27 ENCOUNTER — Encounter: Payer: Self-pay | Admitting: Family Medicine

## 2011-09-27 VITALS — BP 105/65 | HR 58 | Temp 97.2°F | Ht 63.0 in | Wt 225.3 lb

## 2011-09-27 DIAGNOSIS — O34219 Maternal care for unspecified type scar from previous cesarean delivery: Secondary | ICD-10-CM

## 2011-09-27 DIAGNOSIS — Z98891 History of uterine scar from previous surgery: Secondary | ICD-10-CM

## 2011-09-27 DIAGNOSIS — O99345 Other mental disorders complicating the puerperium: Secondary | ICD-10-CM

## 2011-09-27 DIAGNOSIS — N390 Urinary tract infection, site not specified: Secondary | ICD-10-CM

## 2011-09-27 DIAGNOSIS — F53 Postpartum depression: Secondary | ICD-10-CM | POA: Insufficient documentation

## 2011-09-27 DIAGNOSIS — H5711 Ocular pain, right eye: Secondary | ICD-10-CM

## 2011-09-27 LAB — POCT URINALYSIS DIP (DEVICE)
Bilirubin Urine: NEGATIVE
Glucose, UA: NEGATIVE mg/dL
Nitrite: NEGATIVE
Specific Gravity, Urine: 1.01 (ref 1.005–1.030)
Urobilinogen, UA: 0.2 mg/dL (ref 0.0–1.0)

## 2011-09-27 MED ORDER — SULFAMETHOXAZOLE-TMP DS 800-160 MG PO TABS: 1.0000 | ORAL_TABLET | Freq: Two times a day (BID) | ORAL | Status: AC

## 2011-09-27 MED ORDER — VITAMIN D3 250 MCG (10000 UT) PO CAPS: 10000.0000 [IU] | ORAL_CAPSULE | ORAL | Status: DC

## 2011-09-27 NOTE — Progress Notes (Signed)
SW received call from Clinic staff requesting that SW meet with patient who scored 16 on the PPD screen.  SW met with patient and her husband, who she wanted to be present to discuss her current emotional state.  Patient was quiet, but states that she often does not have the energy to get out of bed.  She states she feels emotionally drained, but denies any other PPD symptoms.  SW discussed PPD and normalized patient's feeling and discussed interventions such as counseling and medication if she feels she is not managing her symptoms on her own.  She states she does not want medication and will think about seeing a counselor.  SW gave her the phone number to [facility] who will treat uninsured patients, since patient only has pregnancy Medicaid, which will end next month.  SW also gave her the "Feelings After Birth" with information about [facility] PPD support group.  Patient thanked SW.

## 2011-09-27 NOTE — Patient Instructions (Signed)

## 2011-09-27 NOTE — Telephone Encounter (Signed)
Scheduled appointment to [facility] Fri June 7 9:15. Telephoned patient at home # and spoke with husband. Took all information. [phone]

## 2011-09-27 NOTE — Progress Notes (Signed)
Pt has stopped taking prozac due to making her have headaches and blurred vision.

## 2011-09-27 NOTE — Progress Notes (Addendum)
  Subjective:     [name] [name] is a 41 y.o. female who presents for a postpartum visit. She is 5 weeks postpartum following a low cervical transverse Cesarean section. I have fully reviewed the prenatal and intrapartum course. The delivery was at 39 gestational weeks. Outcome: primary cesarean section, low transverse incision. Anesthesia: spinal. Postpartum course has been complicated by postpartum depression. Baby's course has been uncomplicated. Baby is feeding by bottle. Bleeding no bleeding. Bowel function is normal. Bladder function is abnormal: dysuria and hematuria. Patient is not sexually active. Contraception method is condoms. Postpartum depression screening: positive.  Social work has seen patient.  Having pain in right eye that started 2 days ago.   The following portions of the patient's history were reviewed and updated as appropriate: allergies, current medications, past family history, past medical history, past social history, past surgical history and problem list.  Review of Systems Pertinent items are noted in HPI.   Objective:    BP 105/65  Pulse 58  Temp(Src) 97.2 F (36.2 C) (Oral)  Ht 5\' 3"  (1.6 m)  Wt 225 lb 4.8 oz (102.195 kg)  BMI 39.91 kg/m2  Breastfeeding? No  General:  alert, cooperative and no distress  Abdomen: soft, non-tender; bowel sounds normal; no masses,  no organomegaly.  Fundal height [redacted] week gestational size.  Incision well healed.        Assessment:    5 week postpartum exam. Pap smear not done at today's visit. UTI.  Plan:    1. Contraception: condoms 2. Bactrim DS bid x 3 days 3.  Offered patient trial of different anti-depressent.  Patient declined.  Patient given therapists contact info for follow up if desired. 3. Follow up in: 1 year or as needed.

## 2011-09-28 ENCOUNTER — Telehealth: Payer: Self-pay | Admitting: *Deleted

## 2011-09-28 ENCOUNTER — Ambulatory Visit: Payer: Medicaid Other | Admitting: Infectious Disease

## 2011-09-28 NOTE — Telephone Encounter (Signed)
Message left on nurse voice mail for clarification of Rx from Dr. Adrian Blackwater for Vitamin D.  He stated that the usual dosage of Vit. D is 50,000 units weekly. If he wants 10,000 units, the patient will purchase OTC 5000 units and take twice daily. Please respond.

## 2011-09-29 LAB — URINE CULTURE: Colony Count: 90000

## 2011-10-07 MED ORDER — VITAMIN D (ERGOCALCIFEROL) 1.25 MG (50000 UNIT) PO CAPS: 50000.0000 [IU] | ORAL_CAPSULE | ORAL | Status: DC

## 2011-10-07 NOTE — Telephone Encounter (Signed)
Clarification received from Dr. Adrian Blackwater and medication order changes made. I called pt and was told by her husband that she is sleeping now. I stated that I will call back tomorrow.  Pt needs to be informed of Rx sent to her pharmacy. Pt also needs to be instructed to take OTC Vitamin D 1000 units by mouth daily after the current Rx is completed.

## 2011-10-11 NOTE — Telephone Encounter (Signed)
Called patient and got her voicemail, left her a message to call us back.

## 2011-10-12 NOTE — Telephone Encounter (Signed)
Telephoned patient at home # and left message to return call to the clinic.

## 2011-10-13 NOTE — Telephone Encounter (Signed)
Called pt and pt informed me that she is currently taking Vit D 5000 units two capsules per week.  I advised pt to go to her pharmacy to pick up her Rx for Vit D 50000 units and that she will take 1 capsule per week in which she will receive 8 capsules for 8 weeks.  After the Rx she will need to purchase OTC Vit D 1000 units by mouth daily.  Pt stated that she has done that regimen before and had no further questions.

## 2011-10-18 ENCOUNTER — Telehealth: Payer: Self-pay

## 2011-10-18 NOTE — Telephone Encounter (Signed)
Pt called and stated that she is having heavy bleeding and would like to make an appt.

## 2011-10-20 NOTE — Telephone Encounter (Signed)
Pt was scheduled for 10/28/11 @ 215pm.

## 2011-10-23 ENCOUNTER — Encounter (HOSPITAL_COMMUNITY): Payer: Self-pay | Admitting: *Deleted

## 2011-10-23 ENCOUNTER — Inpatient Hospital Stay (HOSPITAL_COMMUNITY)
Admission: AD | Admit: 2011-10-23 | Discharge: 2011-10-23 | Disposition: A | Discharge status: Home / Self Care | Payer: Medicaid Other | Source: Ambulatory Visit | Attending: Obstetrics & Gynecology | Admitting: Obstetrics & Gynecology

## 2011-10-23 DIAGNOSIS — N939 Abnormal uterine and vaginal bleeding, unspecified: Secondary | ICD-10-CM

## 2011-10-23 DIAGNOSIS — N938 Other specified abnormal uterine and vaginal bleeding: Secondary | ICD-10-CM | POA: Insufficient documentation

## 2011-10-23 DIAGNOSIS — N949 Unspecified condition associated with female genital organs and menstrual cycle: Secondary | ICD-10-CM | POA: Insufficient documentation

## 2011-10-23 DIAGNOSIS — N898 Other specified noninflammatory disorders of vagina: Secondary | ICD-10-CM

## 2011-10-23 DIAGNOSIS — R300 Dysuria: Secondary | ICD-10-CM | POA: Insufficient documentation

## 2011-10-23 LAB — URINALYSIS, ROUTINE W REFLEX MICROSCOPIC
Glucose, UA: NEGATIVE mg/dL
Leukocytes, UA: NEGATIVE
Specific Gravity, Urine: <1.005 — ABNORMAL LOW (ref 1.005–1.030)
Urobilinogen, UA: 0.2 mg/dL (ref 0.0–1.0)

## 2011-10-23 LAB — CBC
HCT: 35.5 % — ABNORMAL LOW (ref 36.0–46.0)
MCHC: 32.1 g/dL (ref 30.0–36.0)
MCV: 93.7 fL (ref 78.0–100.0)
WBC: 5.3 10*3/uL (ref 4.0–10.5)

## 2011-10-23 LAB — URINE MICROSCOPIC-ADD ON

## 2011-10-23 LAB — POCT PREGNANCY, URINE: Preg Test, Ur: NEGATIVE

## 2011-10-23 MED ORDER — VITAMIN D (ERGOCALCIFEROL) 1.25 MG (50000 UNIT) PO CAPS: 50000.0000 [IU] | ORAL_CAPSULE | ORAL | Status: DC

## 2011-10-23 MED ORDER — NORGESTIMATE-ETH ESTRADIOL 0.25-35 MG-MCG PO TABS: 1.0000 | ORAL_TABLET | Freq: Every day | ORAL | Status: DC

## 2011-10-23 NOTE — MAU Provider Note (Signed)
History     CSN: [id number]  Arrival date and time: 10/23/11 1926   None     Chief Complaint  Patient presents with  . Vaginal Bleeding  . Dysuria   HPI  Pt states, " I had a C/S 08-18-11, and I started bleeding like a very heavy period on June 7th with blood clots. I am still bleeding intermittently and no report of blood clots today.  Reports lower abdomen pain with urinating.  Pt states "it burns like it did when I had a UTI."  Pt just completed a series of antibiotics for a UTI.  No fever, body aches, or chills.     Past Medical History  Diagnosis Date  . Anxiety   . Preterm labor   . HIV (human immunodeficiency virus infection)   . Blood transfusion 1997    contracted HIV from transfusion  . Anemia     Past Surgical History  Procedure Date  . Cesarean section 04/2000  . Cesarean section 08/18/2011    Procedure: CESAREAN SECTION;  Surgeon: Reva Bores, MD;  Location: [facility];  Service: Gynecology;  Laterality: N/A;  Repeat.  Will need AZT 3 hours before    Family History  Problem Relation Age of Onset  . Other Neg Hx     History  Substance Use Topics  . Smoking status: Never Smoker   . Smokeless tobacco: Never Used  . Alcohol Use: No    Allergies:  Allergies  Allergen Reactions  . Latex Rash    Prescriptions prior to admission  Medication Sig Dispense Refill  . Darunavir Ethanolate (PREZISTA) 800 MG tablet Take 1 tablet (800 mg total) by mouth daily with breakfast.  30 tablet  6  . docusate sodium (COLACE) 100 MG capsule Take 100 mg by mouth 2 (two) times daily as needed. For constipation      . emtricitabine-tenofovir (TRUVADA) 200-300 MG per tablet Take 1 tablet by mouth daily.  30 tablet  3  . ferrous sulfate 325 (65 FE) MG tablet Take 325 mg by mouth daily with breakfast.      . FLUoxetine (PROZAC) 20 MG capsule Take 1 capsule (20 mg total) by mouth daily.  30 capsule  6  . ritonavir (NORVIR) 100 MG capsule Take 1 capsule (100 mg total) by mouth  daily.  30 capsule  3  . Vitamin D, Ergocalciferol, (DRISDOL) 50000 UNITS CAPS Take 1 capsule (50,000 Units total) by mouth every 7 (seven) days.  8 capsule  0    Review of Systems  Constitutional: Negative for fever and chills.  Gastrointestinal: Positive for abdominal pain (suprapubic pain).  Genitourinary: Positive for dysuria. Negative for urgency, frequency, hematuria and flank pain.       Vaginal bleeding  All other systems reviewed and are negative.   Physical Exam   Blood pressure 112/53, pulse 70, temperature 98.7 F (37.1 C), temperature source Oral, resp. rate 18, height 5\' 2"  (1.575 m), weight 104.384 kg (230 lb 2 oz), last menstrual period 10/15/2011, not currently breastfeeding.  Physical Exam  Constitutional: She is oriented to person, place, and time. She appears well-developed and well-nourished. No distress.  HENT:  Head: Normocephalic.  Neck: Normal range of motion. Neck supple.  Cardiovascular: Normal rate, regular rhythm and normal heart sounds.   Respiratory: Effort normal and breath sounds normal.  GI: Soft. There is no tenderness. There is no rebound and no guarding.       Incision site well approximated, well healed, no  signs of infection  Genitourinary: Cervix exhibits no discharge. No bleeding around the vagina.  Musculoskeletal: Normal range of motion. She exhibits no edema.  Neurological: She is alert and oriented to person, place, and time. She has normal reflexes.  Skin: Skin is warm and dry.    MAU Course  Procedures Results for orders placed during the hospital encounter of 10/23/11 (from the past 24 hour(s))  URINALYSIS, ROUTINE W REFLEX MICROSCOPIC     Status: Abnormal   Collection Time   10/23/11  7:54 PM      Component Value Range   Color, Urine YELLOW  YELLOW   APPearance CLEAR  CLEAR   Specific Gravity, Urine <1.005 (*) 1.005 - 1.030   pH 6.0  5.0 - 8.0   Glucose, UA NEGATIVE  NEGATIVE mg/dL   Hgb urine dipstick TRACE (*) NEGATIVE    Bilirubin Urine NEGATIVE  NEGATIVE   Ketones, ur NEGATIVE  NEGATIVE mg/dL   Protein, ur NEGATIVE  NEGATIVE mg/dL   Urobilinogen, UA 0.2  0.0 - 1.0 mg/dL   Nitrite NEGATIVE  NEGATIVE   Leukocytes, UA NEGATIVE  NEGATIVE  URINE MICROSCOPIC-ADD ON     Status: Normal   Collection Time   10/23/11  7:54 PM      Component Value Range   Squamous Epithelial / LPF RARE  RARE   WBC, UA 0-2  <3 WBC/hpf   Bacteria, UA RARE  RARE  POCT PREGNANCY, URINE     Status: Normal   Collection Time   10/23/11  8:03 PM      Component Value Range   Preg Test, Ur NEGATIVE  NEGATIVE  CBC     Status: Abnormal   Collection Time   10/23/11  8:54 PM      Component Value Range   WBC 5.3  4.0 - 10.5 K/uL   RBC 3.79 (*) 3.87 - 5.11 MIL/uL   Hemoglobin 11.4 (*) 12.0 - 15.0 g/dL   HCT 52.8 (*) 41.3 - 24.4 %   MCV 93.7  78.0 - 100.0 fL   MCH 30.1  26.0 - 34.0 pg   MCHC 32.1  30.0 - 36.0 g/dL   RDW 01.0 (*) 27.2 - 53.6 %   Platelets 212  150 - 400 K/uL     Assessment and Plan  Vaginal Bleeding - Possible Menses  Plan: DC to home RX OCPs Urine culture Schedule appt in gyn clinic for well woman exam  [facility] 10/23/2011, 8:50 PM

## 2011-10-23 NOTE — MAU Note (Signed)
Pt states, " I had a C/S 08-18-11, and I started bleeding like a very heavy period on June 7th with blood clots. I am still bleeding  But it is on and off today, and today no clots, but I still have pain when I pea at my belly button, and it burns like it did when I had a UTI."

## 2011-10-23 NOTE — MAU Provider Note (Signed)
Attestation of Attending Supervision of Advanced Practitioner (CNM/NP): Evaluation and management procedures were performed by the Advanced Practitioner under my supervision and collaboration.  I have reviewed the Advanced Practitioner's note and chart, and I agree with the management and plan.  Blanchie Zeleznik, M.D. 10/23/2011 10:29 PM  

## 2011-10-23 NOTE — Progress Notes (Signed)
Written and verbal d/c instructions given by W. Tanja Port CNM and understanding voiced

## 2011-10-25 ENCOUNTER — Ambulatory Visit (INDEPENDENT_AMBULATORY_CARE_PROVIDER_SITE_OTHER): Payer: Medicaid Other | Admitting: Infectious Disease

## 2011-10-25 ENCOUNTER — Encounter: Payer: Self-pay | Admitting: Infectious Disease

## 2011-10-25 VITALS — BP 113/74 | HR 62 | Temp 97.9°F | Wt 229.0 lb

## 2011-10-25 DIAGNOSIS — Z21 Asymptomatic human immunodeficiency virus [HIV] infection status: Secondary | ICD-10-CM

## 2011-10-25 DIAGNOSIS — B20 Human immunodeficiency virus [HIV] disease: Secondary | ICD-10-CM

## 2011-10-25 DIAGNOSIS — N920 Excessive and frequent menstruation with regular cycle: Secondary | ICD-10-CM

## 2011-10-25 NOTE — Patient Instructions (Addendum)
We have social workers with whom you can work with and you may also need to meet with Britta Mccreedy for ADAP if medicaid expires We will give you contact number for the ESTEEM study from [facility] that I spoke to you about  YOu should MAKE SURE YOUR spouse uses condoms with intercourse because the oral birth control medicine you are currently taking has a significant drug interaction with your antivirals that results in lowering of the amount of birth control medicine in your body making it still possible for you to become pregnant. You could consider IUD or injectable option (depo provera) in future

## 2011-10-25 NOTE — Progress Notes (Signed)
Subjective:    Patient ID: [name] [name], female    DOB: 12-14-1970, 41 y.o.   MRN: [id number]  HPI  41 year old African lady with HIV he does do well controlled during pregnancy with Reyataz Norvir Truvada or recently changed back to Prezista Norvir Truvada. She states she missed 2 doses of her medicines in the last month but has otherwise been highly adherent. She had problems with menorrhagia was admitted to [facility] and started on oral contraceptives. Reviewed the drug interactions and the Ortho Tri-Cyclen that she is on will be in lower levels than it would be absent the norvir. Therefore I've cautioned her that she should use barrier contraception and might want to consider other hormonal methods of contraception including possible IUD. Otherwise she's doing well today without any specific complaints. She is concerned that Medicaid might expire and she will meet with a counselor here.   Review of Systems  Constitutional: Negative for fever, chills, diaphoresis, activity change, appetite change, fatigue and unexpected weight change.  HENT: Negative for congestion, sore throat, rhinorrhea, sneezing, trouble swallowing and sinus pressure.   Eyes: Negative for photophobia and visual disturbance.  Respiratory: Negative for cough, chest tightness, shortness of breath, wheezing and stridor.   Cardiovascular: Negative for chest pain, palpitations and leg swelling.  Gastrointestinal: Negative for nausea, vomiting, abdominal pain, diarrhea, constipation, blood in stool, abdominal distention and anal bleeding.  Genitourinary: Negative for dysuria, hematuria, flank pain and difficulty urinating.  Musculoskeletal: Negative for myalgias, back pain, joint swelling, arthralgias and gait problem.  Skin: Negative for color change, pallor, rash and wound.  Neurological: Negative for dizziness, tremors, weakness and light-headedness.  Hematological: Negative for adenopathy. Does not bruise/bleed  easily.  Psychiatric/Behavioral: Negative for behavioral problems, confusion, disturbed wake/sleep cycle, dysphoric mood, decreased concentration and agitation.       Objective:   Physical Exam  Constitutional: She is oriented to person, place, and time. She appears well-developed and well-nourished. No distress.  HENT:  Head: Normocephalic and atraumatic.  Mouth/Throat: Oropharynx is clear and moist. No oropharyngeal exudate.  Eyes: Conjunctivae and EOM are normal. Pupils are equal, round, and reactive to light. No scleral icterus.  Neck: Normal range of motion. Neck supple. No JVD present.  Cardiovascular: Normal rate, regular rhythm and normal heart sounds.  Exam reveals no gallop and no friction rub.   No murmur heard. Pulmonary/Chest: Effort normal and breath sounds normal. No respiratory distress. She has no wheezes. She has no rales. She exhibits no tenderness.  Abdominal: She exhibits no distension and no mass. There is no tenderness. There is no rebound and no guarding.  Musculoskeletal: She exhibits no edema and no tenderness.  Lymphadenopathy:    She has no cervical adenopathy.  Neurological: She is alert and oriented to person, place, and time. She has normal reflexes. She exhibits normal muscle tone. Coordination normal.  Skin: Skin is warm and dry. She is not diaphoretic. No erythema. No pallor.  Psychiatric: She has a normal mood and affect. Her behavior is normal. Judgment and thought content normal.          Assessment & Plan:  HIV (human immunodeficiency virus infection) Recheck viral load and CD4 count  Menorrhagia Now on Ortho Tri-Cyclen. Note drug interactions and ritonavir and Ortho Tri-Cyclen which will lower levels of this contraceptive in her systemic circulation and will put her at risk for getting pregnant if she does not use barrier contraception. May want to consider other forms of hormonal contraception as possible Marina ring.

## 2011-10-25 NOTE — Assessment & Plan Note (Signed)
Now on Ortho Tri-Cyclen. Note drug interactions and ritonavir and Ortho Tri-Cyclen which will lower levels of this contraceptive in her systemic circulation and will put her at risk for getting pregnant if she does not use barrier contraception. May want to consider other forms of hormonal contraception as possible Marina ring.

## 2011-10-25 NOTE — Assessment & Plan Note (Signed)
Recheck viral load and CD4 count

## 2011-10-26 LAB — COMPLETE METABOLIC PANEL WITH GFR
ALT: 18 U/L (ref 0–35)
AST: 40 U/L — ABNORMAL HIGH (ref 0–37)
Albumin: 3.5 g/dL (ref 3.5–5.2)
BUN: 12 mg/dL (ref 6–23)
Calcium: 8.7 mg/dL (ref 8.4–10.5)
Chloride: 103 mEq/L (ref 96–112)
Potassium: >7.5 mEq/L (ref 3.5–5.3)

## 2011-10-26 LAB — CBC WITH DIFFERENTIAL/PLATELET
Basophils Absolute: 0.1 10*3/uL (ref 0.0–0.1)
Eosinophils Absolute: 0.1 10*3/uL (ref 0.0–0.7)
Eosinophils Relative: 2 % (ref 0–5)
MCH: 29.3 pg (ref 26.0–34.0)
MCHC: 32.7 g/dL (ref 30.0–36.0)
MCV: 89.6 fL (ref 78.0–100.0)
Platelets: 292 10*3/uL (ref 150–400)
RDW: 18.7 % — ABNORMAL HIGH (ref 11.5–15.5)

## 2011-10-27 ENCOUNTER — Other Ambulatory Visit: Payer: Medicaid Other

## 2011-10-27 ENCOUNTER — Telehealth: Payer: Self-pay | Admitting: Infectious Disease

## 2011-10-27 ENCOUNTER — Other Ambulatory Visit: Payer: Self-pay | Admitting: Infectious Disease

## 2011-10-27 ENCOUNTER — Telehealth: Payer: Self-pay | Admitting: *Deleted

## 2011-10-27 DIAGNOSIS — B20 Human immunodeficiency virus [HIV] disease: Secondary | ICD-10-CM

## 2011-10-27 DIAGNOSIS — E875 Hyperkalemia: Secondary | ICD-10-CM

## 2011-10-27 LAB — BASIC METABOLIC PANEL
BUN: 10 mg/dL (ref 6–23)
Calcium: 8.2 mg/dL — ABNORMAL LOW (ref 8.4–10.5)
Glucose, Bld: 80 mg/dL (ref 70–99)
Potassium: 4.4 mEq/L (ref 3.5–5.3)

## 2011-10-27 LAB — HIV-1 RNA QUANT-NO REFLEX-BLD
HIV 1 RNA Quant: <20 copies/mL (ref <20)
HIV-1 RNA Quant, Log: <1.3 {Log} (ref <1.30)

## 2011-10-27 NOTE — Telephone Encounter (Signed)
[name] [name] from [facility] just called to report a potassium high of greater than 7.5. States the specimen was hemolyzed. I called Dr. Daiva Eves. He had seen it this am & has contacted them to have her come in for a repeat lab

## 2011-10-27 NOTE — Telephone Encounter (Signed)
Patient had critically high potassium. I do not know if ANYONE was called. I did not see result on my desktop in epic till this am. I have called husband and asked him to bring his wife back in for repeat STAT blood work (stat) today with EKG. He will bring her at 1330.

## 2011-10-28 ENCOUNTER — Ambulatory Visit: Payer: Medicaid Other | Admitting: Advanced Practice Midwife

## 2011-11-04 ENCOUNTER — Encounter: Payer: Self-pay | Admitting: Obstetrics & Gynecology

## 2011-11-04 ENCOUNTER — Telehealth: Payer: Self-pay | Admitting: *Deleted

## 2011-11-04 ENCOUNTER — Telehealth: Payer: Self-pay | Admitting: Obstetrics and Gynecology

## 2011-11-04 ENCOUNTER — Ambulatory Visit (INDEPENDENT_AMBULATORY_CARE_PROVIDER_SITE_OTHER): Payer: Medicaid Other | Admitting: Obstetrics & Gynecology

## 2011-11-04 VITALS — BP 112/66 | HR 64 | Temp 97.0°F | Ht 62.0 in | Wt 230.5 lb

## 2011-11-04 DIAGNOSIS — N920 Excessive and frequent menstruation with regular cycle: Secondary | ICD-10-CM

## 2011-11-04 NOTE — Telephone Encounter (Signed)
Telephoned and left voicemail for patient.  She had requested to see me when checking out after her appointment today.  Left message asking she call me back.

## 2011-11-04 NOTE — Telephone Encounter (Signed)
Patient's pharmacy called to request a 3 month supply of Sprintec per patient request. Verbally approved by Sid Falcon, CNM.

## 2011-11-04 NOTE — Progress Notes (Signed)
  Subjective:    Patient ID: [name] [name], female    DOB: 07/26/1970, 41 y.o.   MRN: [id number]  HPI  41 yo lady who is now 2 months post op s/p C/S. She is here for a follow up of her MAU visit 10-23-11. She was seen there because she had bled for 8 days. She was diagnosed with her period. Her Hbg was 11.5. She was given a prescription for birth control pills but she has not yet started them. She is currently using condoms for birth control.The FOB is here today demanding an ultrasound.   Review of Systems    She says her pap was 10/12. Objective:   Physical Exam Slightly atrophic vulva/vagina, female circumcision, normal cervix Bimanual exam reveals an involuted NT uterus and normal adnexa       Assessment & Plan:  I have tried to reassure the patient and her husband but at the conclusion of the visit, he was still asking for an u/s. We have agreed that she will schedule another visit with another provider to get a second opinion about the utility of an u/s. I have encouraged her to start her OCPs today and continue condom use for at least another month.Marland Kitchen
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
## 2011-11-12 ENCOUNTER — Emergency Department (HOSPITAL_COMMUNITY): Payer: Medicaid Other

## 2011-11-12 ENCOUNTER — Emergency Department (HOSPITAL_COMMUNITY)
Admission: EM | Admit: 2011-11-12 | Discharge: 2011-11-12 | Disposition: A | Discharge status: Home / Self Care | Payer: Medicaid Other | Attending: Emergency Medicine | Admitting: Emergency Medicine

## 2011-11-12 ENCOUNTER — Encounter (HOSPITAL_COMMUNITY): Payer: Self-pay | Admitting: *Deleted

## 2011-11-12 DIAGNOSIS — F411 Generalized anxiety disorder: Secondary | ICD-10-CM | POA: Insufficient documentation

## 2011-11-12 DIAGNOSIS — N390 Urinary tract infection, site not specified: Secondary | ICD-10-CM | POA: Insufficient documentation

## 2011-11-12 DIAGNOSIS — N920 Excessive and frequent menstruation with regular cycle: Secondary | ICD-10-CM

## 2011-11-12 DIAGNOSIS — Z21 Asymptomatic human immunodeficiency virus [HIV] infection status: Secondary | ICD-10-CM | POA: Insufficient documentation

## 2011-11-12 LAB — CBC
HCT: 39.5 % (ref 36.0–46.0)
MCH: 29.9 pg (ref 26.0–34.0)
MCHC: 31.9 g/dL (ref 30.0–36.0)
RDW: 18.2 % — ABNORMAL HIGH (ref 11.5–15.5)

## 2011-11-12 LAB — BASIC METABOLIC PANEL
BUN: 7 mg/dL (ref 6–23)
Calcium: 8.7 mg/dL (ref 8.4–10.5)
GFR calc Af Amer: >90 mL/min (ref >90)
GFR calc non Af Amer: >90 mL/min (ref >90)
Potassium: 4.4 mEq/L (ref 3.5–5.1)

## 2011-11-12 LAB — URINALYSIS, ROUTINE W REFLEX MICROSCOPIC
Bilirubin Urine: NEGATIVE
Ketones, ur: NEGATIVE mg/dL
Nitrite: NEGATIVE
Protein, ur: NEGATIVE mg/dL
Specific Gravity, Urine: 1.021 (ref 1.005–1.030)
Urobilinogen, UA: 1 mg/dL (ref 0.0–1.0)

## 2011-11-12 LAB — HEPATIC FUNCTION PANEL
ALT: 20 U/L (ref 0–35)
Albumin: 3.1 g/dL — ABNORMAL LOW (ref 3.5–5.2)
Alkaline Phosphatase: 92 U/L (ref 39–117)
Total Protein: 7.3 g/dL (ref 6.0–8.3)

## 2011-11-12 LAB — PREGNANCY, URINE: Preg Test, Ur: NEGATIVE

## 2011-11-12 LAB — URINE MICROSCOPIC-ADD ON

## 2011-11-12 LAB — WET PREP, GENITAL: Yeast Wet Prep HPF POC: NONE SEEN

## 2011-11-12 MED ORDER — CEPHALEXIN 500 MG PO CAPS: 500.0000 mg | ORAL_CAPSULE | Freq: Two times a day (BID) | ORAL | Status: AC

## 2011-11-12 MED ORDER — SODIUM CHLORIDE 0.9 % IV BOLUS (SEPSIS)
1000.0000 mL | Freq: Once | INTRAVENOUS | Status: AC
  Administered 2011-11-12: 1000 mL via INTRAVENOUS

## 2011-11-12 NOTE — ED Notes (Signed)
Pt reports delivered on April 10th- with c-section. Pt denies other delivery complications.  Pt reports bleeding started June 7th- patient reports bleeding is heavy with nickel sized clots and having to change pads every thirty minutes. Bleeding became especially heavy yesterday.  Pt has not been taking birth control. Pt denies N/V.  Pt reports feeling dizzy and lower central abdominal pain/suprapubic pain.

## 2011-11-12 NOTE — ED Notes (Signed)
Patient transported to Ultrasound via w/c by U/S tech for exam to be done by the female U/S tech.

## 2011-11-12 NOTE — ED Provider Notes (Signed)
Medical screening examination/treatment/procedure(s) were performed by non-physician practitioner and as supervising physician I was immediately available for consultation/collaboration.   Macallister Ashmead L Iyad Deroo, MD 11/12/11 2324 

## 2011-11-12 NOTE — ED Provider Notes (Signed)
  Physical Exam  BP 115/59  Pulse 52  Temp 97.9 F (36.6 C)  Resp 22  SpO2 100%  LMP 11/12/2011  Breastfeeding? No  Physical Exam Awaiting female technician for ultrasound  ED Course  Procedures  MDM untrasound normal to follow up with OB       Arman Filter, NP 11/12/11 2127

## 2011-11-12 NOTE — ED Notes (Signed)
Patient transported to Ultrasound via w/c w/husband and female chapperone.

## 2011-11-12 NOTE — ED Notes (Signed)
U/S tech has called stating pt is requesting to have a female U/S tech or if she can come back tomorrow.  PA, [facility] aware. Pt is now back as she has refused to have the female U/S tech do the exam.  He has said there will be a female U/S tech arrive in about .

## 2011-11-12 NOTE — ED Provider Notes (Signed)
History     CSN: [id number]  Arrival date & time 11/12/11  1542   First MD Initiated Contact with Patient 11/12/11 1557      Chief Complaint  Patient presents with  . Vaginal Bleeding  . Dizziness  . Abdominal Pain    (Consider location/radiation/quality/duration/timing/severity/associated sxs/prior treatment) HPI  41 year old female complaining of heavy menstrual bleeding since June 7. Patient is HIV positive with last CD4 count over 560, viral load under 50 in October of 2012. Pt had a C. section on April 10 of 2013. Patient had heavy menstruation for 2 weeks starting seventh going through approximately one pad every 30 minutes. Patient saw OB/GYN and was told that this was normal after giving birth. Patient resumed menstruation 3 days ago with same heavy flow. Patient not taking any blood thinners or aspirin. Pt reports a mild epigastric pain starting last night, for which she has taken motrin with minimal relief.   Past Medical History  Diagnosis Date  . Anxiety   . Preterm labor   . HIV (human immunodeficiency virus infection)   . Blood transfusion 1997    contracted HIV from transfusion  . Anemia     Past Surgical History  Procedure Date  . Cesarean section 04/2000  . Cesarean section 08/18/2011    Procedure: CESAREAN SECTION;  Surgeon: Reva Bores, MD;  Location: [facility];  Service: Gynecology;  Laterality: N/A;  Repeat.  Will need AZT 3 hours before    Family History  Problem Relation Age of Onset  . Other Neg Hx     History  Substance Use Topics  . Smoking status: Never Smoker   . Smokeless tobacco: Never Used  . Alcohol Use: No    OB History    Grav Para Term Preterm Abortions TAB SAB Ect Mult Living   6 4 3 1 2  2   4       Review of Systems  Constitutional: Negative for fever.  Gastrointestinal: Positive for abdominal pain. Negative for nausea and vomiting.  Genitourinary: Positive for menstrual problem. Negative for urgency, decreased urine volume,  pelvic pain and dyspareunia.  Neurological: Negative for dizziness.  All other systems reviewed and are negative.    Allergies  Latex  Home Medications   Current Outpatient Rx  Name Route Sig Dispense Refill  . VITAMIN D3 5000 UNITS PO CAPS Oral Take 1 capsule by mouth daily.    Marland Kitchen DARUNAVIR ETHANOLATE 800 MG PO TABS Oral Take 800 mg by mouth daily.    Marland Kitchen DOCUSATE SODIUM 100 MG PO CAPS Oral Take 100 mg by mouth 2 (two) times daily as needed. For constipation    . EMTRICITABINE-TENOFOVIR 200-300 MG PO TABS Oral Take 1 tablet by mouth daily. 30 tablet 3  . FERROUS SULFATE 325 (65 FE) MG PO TABS Oral Take 325 mg by mouth daily with breakfast.    . IBUPROFEN 600 MG PO TABS Oral Take 600 mg by mouth every 6 (six) hours as needed. For pain.    . ADULT MULTIVITAMIN W/MINERALS CH Oral Take 1 tablet by mouth daily.    Marland Kitchen RITONAVIR 100 MG PO CAPS Oral Take 1 capsule (100 mg total) by mouth daily. 30 capsule 3    BP 117/67  Pulse 78  Temp 97.9 F (36.6 C)  Resp 18  SpO2 100%  LMP 11/12/2011  Breastfeeding? No  Physical Exam  Nursing note and vitals reviewed. Constitutional: She is oriented to person, place, and time. She appears well-developed and  well-nourished. No distress.  HENT:  Head: Normocephalic.       Palpebral conjunctiva are ruddy  Eyes: Conjunctivae and EOM are normal. Pupils are equal, round, and reactive to light.  Cardiovascular: Normal rate and regular rhythm.   Pulmonary/Chest: Effort normal and breath sounds normal.  Abdominal: Soft. Bowel sounds are normal. She exhibits no distension and no mass. There is tenderness. There is no rebound and no guarding.       Diffuse tenderness to epigastrium   Genitourinary: Pelvic exam was performed with patient supine. Uterus is tender. Uterus is not enlarged. Cervix exhibits no motion tenderness. Right adnexum displays no mass, no tenderness and no fullness. Left adnexum displays no mass, no tenderness and no fullness. There is  bleeding around the vagina.  Musculoskeletal: Normal range of motion.  Neurological: She is alert and oriented to person, place, and time.  Psychiatric: She has a normal mood and affect.    ED Course  Procedures (including critical care time)  Labs Reviewed  CBC - Abnormal; Notable for the following:    RDW 18.2 (*)     All other components within normal limits  URINALYSIS, ROUTINE W REFLEX MICROSCOPIC - Abnormal; Notable for the following:    Hgb urine dipstick LARGE (*)     Leukocytes, UA TRACE (*)     All other components within normal limits  HEPATIC FUNCTION PANEL - Abnormal; Notable for the following:    Albumin 3.1 (*)     Total Bilirubin 0.2 (*)     All other components within normal limits  WET PREP, GENITAL - Abnormal; Notable for the following:    WBC, Wet Prep HPF POC FEW (*)     All other components within normal limits  PREGNANCY, URINE  BASIC METABOLIC PANEL  URINE MICROSCOPIC-ADD ON  GC/CHLAMYDIA PROBE AMP, GENITAL  URINE CULTURE  LAB REPORT - SCANNED   No results found.   1. Urinary tract infection   2. Menorrhagia       MDM  41 y/o female with heavy menstruation x2 cycles. No Anemia. UTI noted on UA. PT's vital signs are stable and abdominal exam is benign. Ultrasound ordered at Pt and husband request as courtesy to expedite evaluation which will not be available over the weekend. Pt prefers female [place] tech so care transferred at end of shift  to NP Erroll Luna.         Wynetta Emery, PA-C 11/17/11 2349

## 2011-11-13 LAB — GC/CHLAMYDIA PROBE AMP, GENITAL: GC Probe Amp, Genital: NEGATIVE

## 2011-11-13 LAB — URINE CULTURE

## 2011-11-18 NOTE — ED Provider Notes (Signed)
Medical screening examination/treatment/procedure(s) were performed by non-physician practitioner and as supervising physician I was immediately available for consultation/collaboration.   Benny Lennert, MD 11/18/11 1343

## 2011-12-01 ENCOUNTER — Ambulatory Visit: Payer: Medicaid Other | Admitting: Obstetrics & Gynecology

## 2012-01-11 ENCOUNTER — Other Ambulatory Visit: Payer: Self-pay | Admitting: Infectious Disease

## 2012-02-10 ENCOUNTER — Other Ambulatory Visit: Payer: Medicaid Other

## 2012-02-22 ENCOUNTER — Other Ambulatory Visit: Payer: Medicaid Other

## 2012-02-22 DIAGNOSIS — B20 Human immunodeficiency virus [HIV] disease: Secondary | ICD-10-CM

## 2012-02-22 LAB — CBC WITH DIFFERENTIAL/PLATELET
Basophils Absolute: 0 10*3/uL (ref 0.0–0.1)
Basophils Relative: 0 % (ref 0–1)
Eosinophils Absolute: 0 10*3/uL (ref 0.0–0.7)
Eosinophils Relative: 1 % (ref 0–5)
HCT: 35.8 % — ABNORMAL LOW (ref 36.0–46.0)
MCH: 29 pg (ref 26.0–34.0)
MCHC: 32.1 g/dL (ref 30.0–36.0)
MCV: 90.2 fL (ref 78.0–100.0)
Monocytes Absolute: 0.6 10*3/uL (ref 0.1–1.0)
Neutro Abs: 4.4 10*3/uL (ref 1.7–7.7)
RDW: 15.9 % — ABNORMAL HIGH (ref 11.5–15.5)

## 2012-02-23 LAB — LIPID PANEL
LDL Cholesterol: 93 mg/dL (ref 0–99)
VLDL: 13 mg/dL (ref 0–40)

## 2012-02-23 LAB — COMPLETE METABOLIC PANEL WITH GFR
ALT: 15 U/L (ref 0–35)
Albumin: 3.6 g/dL (ref 3.5–5.2)
Alkaline Phosphatase: 92 U/L (ref 39–117)
GFR, Est Non African American: >89 mL/min
Glucose, Bld: 78 mg/dL (ref 70–99)
Potassium: 4.4 mEq/L (ref 3.5–5.3)
Sodium: 137 mEq/L (ref 135–145)
Total Protein: 6.8 g/dL (ref 6.0–8.3)

## 2012-02-23 LAB — RPR

## 2012-02-23 LAB — HIV-1 RNA QUANT-NO REFLEX-BLD: HIV-1 RNA Quant, Log: <1.3 {Log} (ref <1.30)

## 2012-02-23 LAB — T-HELPER CELL (CD4) - (RCID CLINIC ONLY): CD4 % Helper T Cell: 28 % — ABNORMAL LOW (ref 33–55)

## 2012-02-24 ENCOUNTER — Ambulatory Visit: Payer: Medicaid Other | Admitting: Infectious Disease

## 2012-03-07 ENCOUNTER — Encounter: Payer: Self-pay | Admitting: Infectious Disease

## 2012-03-07 ENCOUNTER — Other Ambulatory Visit: Payer: Self-pay | Admitting: Infectious Disease

## 2012-03-07 ENCOUNTER — Ambulatory Visit (INDEPENDENT_AMBULATORY_CARE_PROVIDER_SITE_OTHER): Payer: Medicaid Other | Admitting: Infectious Disease

## 2012-03-07 VITALS — BP 103/72 | HR 76 | Temp 97.5°F | Ht 63.0 in | Wt 220.0 lb

## 2012-03-07 DIAGNOSIS — N920 Excessive and frequent menstruation with regular cycle: Secondary | ICD-10-CM

## 2012-03-07 DIAGNOSIS — Z21 Asymptomatic human immunodeficiency virus [HIV] infection status: Secondary | ICD-10-CM

## 2012-03-07 DIAGNOSIS — E669 Obesity, unspecified: Secondary | ICD-10-CM

## 2012-03-07 DIAGNOSIS — B20 Human immunodeficiency virus [HIV] disease: Secondary | ICD-10-CM

## 2012-03-07 DIAGNOSIS — R5381 Other malaise: Secondary | ICD-10-CM

## 2012-03-07 DIAGNOSIS — E559 Vitamin D deficiency, unspecified: Secondary | ICD-10-CM

## 2012-03-07 DIAGNOSIS — R5383 Other fatigue: Secondary | ICD-10-CM

## 2012-03-07 MED ORDER — RITONAVIR 100 MG PO TABS: 100.0000 mg | ORAL_TABLET | Freq: Every day | ORAL | Status: DC

## 2012-03-07 MED ORDER — NORGESTIMATE-ETH ESTRADIOL 0.25-35 MG-MCG PO TABS: 1.0000 | ORAL_TABLET | Freq: Every day | ORAL | Status: AC

## 2012-03-07 MED ORDER — DARUNAVIR ETHANOLATE 800 MG PO TABS: 800.0000 mg | ORAL_TABLET | Freq: Every day | ORAL | Status: DC

## 2012-03-07 MED ORDER — EMTRICITABINE-TENOFOVIR DF 200-300 MG PO TABS: 1.0000 | ORAL_TABLET | Freq: Every day | ORAL | Status: DC

## 2012-03-07 NOTE — Progress Notes (Signed)
Subjective:    Patient ID: [name] [name], female    DOB: [birth year], 41 y.o.   MRN: [id number]  HPI  [name] is a 41 y.o. female who is doing superbly well on her  antiviral regimen, Prezista Norvir and Truvada with undetectable viral load and health cd4 count. She is not taking the oral contraceptive prescribed to her by her gynecologist due to her concerns about taking too many pills. She complains to me about having symptoms of fatigue and I imagine that this could be due to the anemia that she is suffering from her blood loss with her heavy menstrual periods. I've encouraged her to restart oral contraceptives again mentioned that there is a drug interaction between Prezista Norvir and her oral contraceptives which will render them as effective at preventing contraception but still effective at attenuating her heavy menstrual bleeding. Another alternative would be to change her back to Reyataz Norvir and Truvada but the patient refused to make such switch.  She is also concerned about her vitamin D levels I met with promised her I would check these today. She refuses influenza vaccine today stating that she had a severe reaction of congestion and myalgias after her last flu vaccine in 2011 when she also had in 2002 and she states would never take a flu shot again.  I spent greater than 45 minutes with the patient including greater than 50% of time in face to face counsel of the patient and in coordination of their care.    Review of Systems  Constitutional: Negative for fever, chills, diaphoresis, activity change, appetite change, fatigue and unexpected weight change.  HENT: Negative for congestion, sore throat, rhinorrhea, sneezing, trouble swallowing and sinus pressure.   Eyes: Negative for photophobia and visual disturbance.  Respiratory: Negative for cough, chest tightness, shortness of breath, wheezing and stridor.   Cardiovascular: Negative for chest pain, palpitations and leg  swelling.  Gastrointestinal: Negative for nausea, vomiting, abdominal pain, diarrhea, constipation, blood in stool, abdominal distention and anal bleeding.  Genitourinary: Positive for vaginal bleeding. Negative for dysuria, hematuria, flank pain and difficulty urinating.  Musculoskeletal: Negative for myalgias, back pain, joint swelling, arthralgias and gait problem.  Skin: Negative for color change, pallor, rash and wound.  Neurological: Negative for dizziness, tremors, weakness and light-headedness.  Hematological: Negative for adenopathy. Does not bruise/bleed easily.  Psychiatric/Behavioral: Negative for behavioral problems, confusion, disturbed wake/sleep cycle, dysphoric mood, decreased concentration and agitation.       Objective:   Physical Exam  Constitutional: She is oriented to person, place, and time. She appears well-developed and well-nourished. No distress.  HENT:  Head: Normocephalic and atraumatic.  Mouth/Throat: Oropharynx is clear and moist. No oropharyngeal exudate.  Eyes: Conjunctivae normal and EOM are normal. Pupils are equal, round, and reactive to light. No scleral icterus.  Neck: Normal range of motion. Neck supple. No JVD present.  Cardiovascular: Normal rate, regular rhythm and normal heart sounds.  Exam reveals no gallop and no friction rub.   No murmur heard. Pulmonary/Chest: Effort normal and breath sounds normal. No respiratory distress. She has no wheezes. She has no rales. She exhibits no tenderness.  Abdominal: She exhibits no distension and no mass. There is no tenderness. There is no rebound and no guarding.  Musculoskeletal: She exhibits no edema and no tenderness.  Lymphadenopathy:    She has no cervical adenopathy.  Neurological: She is alert and oriented to person, place, and time. She has normal reflexes. She exhibits normal muscle tone. Coordination normal.  Skin: Skin is warm and dry. She is not diaphoretic. No erythema. No pallor.    Psychiatric: She has a normal mood and affect. Her behavior is normal. Judgment and thought content normal.          Assessment & Plan:    #1 HIV: Continue Prezista Norvir Truvada. We had mentioned the possibility of putting on Reyataz Norvir Truvada with a better drug interaction profile with her Ortho Tri-Cyclen but she refused this change in regimen.  #2 menorrhagia: I have rewritten prescription for Ortho Tri-Cyclen but emphasized to her that this will not be by itself and effective means of contraception due to drug drug interaction with Norvir and Prezista.  #3 Fatigue likely due to her anemia due to her menorrhagia as mentioned refilled oral contraceptives.  #4 question of vitamin deficiency we'll check levels.  #5 weight gain: Counsel her about diet and exercise and encouraged her to meet with our dietitian here at [facility]. She has enrolled at one of the local gym to have encouraged her to join a class there.

## 2012-03-08 LAB — TSH: TSH: 1.684 u[IU]/mL (ref 0.350–4.500)

## 2012-07-03 ENCOUNTER — Other Ambulatory Visit: Payer: Medicaid Other

## 2012-07-17 ENCOUNTER — Ambulatory Visit: Payer: Medicaid Other | Admitting: Infectious Disease

## 2012-07-20 ENCOUNTER — Other Ambulatory Visit: Payer: Self-pay | Admitting: Infectious Disease

## 2012-07-20 ENCOUNTER — Other Ambulatory Visit (INDEPENDENT_AMBULATORY_CARE_PROVIDER_SITE_OTHER): Payer: Medicaid Other

## 2012-07-20 DIAGNOSIS — Z21 Asymptomatic human immunodeficiency virus [HIV] infection status: Secondary | ICD-10-CM

## 2012-07-20 DIAGNOSIS — B20 Human immunodeficiency virus [HIV] disease: Secondary | ICD-10-CM

## 2012-07-20 DIAGNOSIS — N926 Irregular menstruation, unspecified: Secondary | ICD-10-CM

## 2012-07-21 LAB — CBC WITH DIFFERENTIAL/PLATELET
Basophils Absolute: 0.1 10*3/uL (ref 0.0–0.1)
Basophils Relative: 1 % (ref 0–1)
HCT: 35.4 % — ABNORMAL LOW (ref 36.0–46.0)
Lymphocytes Relative: 43 % (ref 12–46)
MCHC: 32.8 g/dL (ref 30.0–36.0)
Monocytes Absolute: 0.4 10*3/uL (ref 0.1–1.0)
Neutro Abs: 2.8 10*3/uL (ref 1.7–7.7)
Neutrophils Relative %: 47 % (ref 43–77)
RDW: 18.9 % — ABNORMAL HIGH (ref 11.5–15.5)
WBC: 5.9 10*3/uL (ref 4.0–10.5)

## 2012-07-21 LAB — COMPLETE METABOLIC PANEL WITH GFR
ALT: 9 U/L (ref 0–35)
AST: 14 U/L (ref 0–37)
CO2: 22 mEq/L (ref 19–32)
Calcium: 8.8 mg/dL (ref 8.4–10.5)
Chloride: 105 mEq/L (ref 96–112)
GFR, Est African American: >89 mL/min
Sodium: 137 mEq/L (ref 135–145)
Total Bilirubin: 0.2 mg/dL — ABNORMAL LOW (ref 0.3–1.2)
Total Protein: 7 g/dL (ref 6.0–8.3)

## 2012-07-21 LAB — T-HELPER CELL (CD4) - (RCID CLINIC ONLY)
CD4 % Helper T Cell: 30 % — ABNORMAL LOW (ref 33–55)
CD4 T Cell Abs: 640 uL (ref 400–2700)

## 2012-07-21 LAB — LIPID PANEL
HDL: 56 mg/dL (ref >39)
LDL Cholesterol: 122 mg/dL — ABNORMAL HIGH (ref 0–99)
Triglycerides: 60 mg/dL (ref <150)
VLDL: 12 mg/dL (ref 0–40)

## 2012-07-21 LAB — HCG, SERUM, QUALITATIVE: Preg, Serum: NEGATIVE

## 2012-07-23 LAB — HIV-1 RNA QUANT-NO REFLEX-BLD
HIV 1 RNA Quant: <20 copies/mL (ref <20)
HIV-1 RNA Quant, Log: <1.3 {Log} (ref <1.30)

## 2012-08-03 ENCOUNTER — Ambulatory Visit: Payer: Medicaid Other | Admitting: Infectious Disease

## 2012-08-07 ENCOUNTER — Ambulatory Visit: Payer: Medicaid Other | Admitting: Infectious Disease

## 2012-09-18 ENCOUNTER — Encounter: Payer: Self-pay | Admitting: *Deleted

## 2012-12-11 ENCOUNTER — Telehealth: Payer: Self-pay | Admitting: *Deleted

## 2012-12-11 ENCOUNTER — Other Ambulatory Visit: Payer: Self-pay | Admitting: *Deleted

## 2012-12-11 DIAGNOSIS — B20 Human immunodeficiency virus [HIV] disease: Secondary | ICD-10-CM

## 2012-12-11 MED ORDER — EMTRICITABINE-TENOFOVIR DF 200-300 MG PO TABS: 1.0000 | ORAL_TABLET | Freq: Every day | ORAL | Status: DC

## 2012-12-11 MED ORDER — DARUNAVIR ETHANOLATE 800 MG PO TABS: 800.0000 mg | ORAL_TABLET | Freq: Every day | ORAL | Status: DC

## 2012-12-11 MED ORDER — RITONAVIR 100 MG PO TABS: 100.0000 mg | ORAL_TABLET | Freq: Every day | ORAL | Status: DC

## 2012-12-11 NOTE — Telephone Encounter (Signed)
Yes that is fine

## 2012-12-11 NOTE — Telephone Encounter (Signed)
Patient has relocated to [place] and is having trouble getting established there with a HIV doctor. Her husband wants [place] to fax a Rx with 2 refills for her 042 medications to [phone] Attn: Corrie Dandy. Advised them we have not seen patient since 10/13 and he may not authorize the Rx but that I will let him know what is going on. The patient will call back later today.

## 2013-05-17 ENCOUNTER — Encounter: Payer: Self-pay | Admitting: Infectious Disease

## 2013-05-17 ENCOUNTER — Ambulatory Visit (INDEPENDENT_AMBULATORY_CARE_PROVIDER_SITE_OTHER): Payer: Medicaid Other | Admitting: Infectious Disease

## 2013-05-17 ENCOUNTER — Ambulatory Visit: Payer: Medicaid Other

## 2013-05-17 VITALS — BP 105/73 | HR 73 | Temp 98.0°F | Ht 63.0 in | Wt 190.0 lb

## 2013-05-17 DIAGNOSIS — B20 Human immunodeficiency virus [HIV] disease: Secondary | ICD-10-CM

## 2013-05-17 DIAGNOSIS — N920 Excessive and frequent menstruation with regular cycle: Secondary | ICD-10-CM

## 2013-05-17 DIAGNOSIS — R053 Chronic cough: Secondary | ICD-10-CM

## 2013-05-17 DIAGNOSIS — R059 Cough, unspecified: Secondary | ICD-10-CM

## 2013-05-17 DIAGNOSIS — R05 Cough: Secondary | ICD-10-CM

## 2013-05-17 LAB — COMPLETE METABOLIC PANEL WITH GFR
ALBUMIN: 3.5 g/dL (ref 3.5–5.2)
ALT: 11 U/L (ref 0–35)
AST: 13 U/L (ref 0–37)
Alkaline Phosphatase: 96 U/L (ref 39–117)
BUN: 9 mg/dL (ref 6–23)
CALCIUM: 8.8 mg/dL (ref 8.4–10.5)
CHLORIDE: 102 meq/L (ref 96–112)
CO2: 27 mEq/L (ref 19–32)
Creat: 0.48 mg/dL — ABNORMAL LOW (ref 0.50–1.10)
GFR, Est African American: >89 mL/min
GFR, Est Non African American: >89 mL/min
Glucose, Bld: 76 mg/dL (ref 70–99)
POTASSIUM: 3.9 meq/L (ref 3.5–5.3)
Sodium: 136 mEq/L (ref 135–145)
Total Bilirubin: 0.2 mg/dL — ABNORMAL LOW (ref 0.3–1.2)
Total Protein: 6.9 g/dL (ref 6.0–8.3)

## 2013-05-17 LAB — CBC WITH DIFFERENTIAL/PLATELET
BASOS ABS: 0 10*3/uL (ref 0.0–0.1)
Basophils Relative: 1 % (ref 0–1)
EOS ABS: 0.1 10*3/uL (ref 0.0–0.7)
EOS PCT: 1 % (ref 0–5)
HCT: 35.6 % — ABNORMAL LOW (ref 36.0–46.0)
Hemoglobin: 11.8 g/dL — ABNORMAL LOW (ref 12.0–15.0)
Lymphocytes Relative: 34 % (ref 12–46)
Lymphs Abs: 2.7 10*3/uL (ref 0.7–4.0)
MCH: 28.8 pg (ref 26.0–34.0)
MCHC: 33.1 g/dL (ref 30.0–36.0)
MCV: 86.8 fL (ref 78.0–100.0)
Monocytes Absolute: 0.6 10*3/uL (ref 0.1–1.0)
Monocytes Relative: 7 % (ref 3–12)
Neutro Abs: 4.7 10*3/uL (ref 1.7–7.7)
Neutrophils Relative %: 57 % (ref 43–77)
PLATELETS: 311 10*3/uL (ref 150–400)
RBC: 4.1 MIL/uL (ref 3.87–5.11)
RDW: 16.3 % — AB (ref 11.5–15.5)
WBC: 8.1 10*3/uL (ref 4.0–10.5)

## 2013-05-17 MED ORDER — FERROUS SULFATE 325 (65 FE) MG PO TABS: 325.0000 mg | ORAL_TABLET | Freq: Every day | ORAL | Status: DC

## 2013-05-17 MED ORDER — HYDROCODONE-HOMATROPINE 5-1.5 MG/5ML PO SYRP: 5.0000 mL | ORAL_SOLUTION | Freq: Four times a day (QID) | ORAL | Status: DC | PRN

## 2013-05-17 MED ORDER — DARUNAVIR ETHANOLATE 800 MG PO TABS: 800.0000 mg | ORAL_TABLET | Freq: Every day | ORAL | Status: DC

## 2013-05-17 MED ORDER — AZITHROMYCIN 250 MG PO TABS: 250.0000 mg | ORAL_TABLET | ORAL | Status: DC

## 2013-05-17 MED ORDER — EMTRICITABINE-TENOFOVIR DF 200-300 MG PO TABS: 1.0000 | ORAL_TABLET | Freq: Every day | ORAL | Status: DC

## 2013-05-17 MED ORDER — RITONAVIR 100 MG PO TABS: 100.0000 mg | ORAL_TABLET | Freq: Every day | ORAL | Status: DC

## 2013-05-17 MED ORDER — VITAMIN D3 125 MCG (5000 UT) PO CAPS: 1.0000 | ORAL_CAPSULE | Freq: Every day | ORAL | Status: DC

## 2013-05-17 MED ORDER — HYDROCODONE-HOMATROPINE 5-1.5 MG/5ML PO SYRP
5.0000 mL | ORAL_SOLUTION | Freq: Four times a day (QID) | ORAL | Status: DC | PRN
Start: 2013-05-17 — End: 2013-07-09

## 2013-05-17 NOTE — Progress Notes (Signed)
   Subjective:    Patient ID: [name] [name], female    DOB: 05/16/1970, 43 y.o.   MRN: [id number]  Cough    43 year old African lady with HIV on prezista, norvir and truvada who returns with her husband as a walk in acutely. [place] in care [place] with Medicaid coverage.   She has had a protracted cough since November 2014 that has not resolved.   She and her husband blame the flu vaccine which she received in August and after which she felt sick for a week. I told her and her husband that her current ssx IN NO way could be related to her flu vaccine. I suggested that her recently acquired habit of smoking tobacco could be playing a significant role along with second hand smoke from her husband though I could not exclude that she might have bacterial bronchitis, PNA.     Review of Systems  HENT: Positive for congestion.   Respiratory: Positive for cough.   Genitourinary: Positive for vaginal bleeding.       Objective:   Physical Exam  Constitutional: She is oriented to person, place, and time. She appears well-developed and well-nourished. No distress.  HENT:  Head: Normocephalic and atraumatic.  Mouth/Throat: Oropharynx is clear and moist. No oropharyngeal exudate.  Eyes: Conjunctivae and EOM are normal. Pupils are equal, round, and reactive to light. No scleral icterus.  Neck: Normal range of motion. Neck supple. No JVD present.  Cardiovascular: Normal rate, regular rhythm and normal heart sounds.  Exam reveals no gallop and no friction rub.   No murmur heard. Pulmonary/Chest: Effort normal and breath sounds normal. No respiratory distress. She has no wheezes. She has no rales. She exhibits tenderness.  Deep breathing elicits protracted coughing spells  Abdominal: She exhibits no distension and no mass. There is no tenderness. There is no rebound and no guarding.  Musculoskeletal: She exhibits no edema and no tenderness.    Lymphadenopathy:    She has no cervical adenopathy.  Neurological: She is alert and oriented to person, place, and time. She has normal reflexes. She exhibits normal muscle tone. Coordination normal.  Skin: Skin is warm and dry. She is not diaphoretic. No erythema. No pallor.  Psychiatric: She has a normal mood and affect. Her behavior is normal. Judgment and thought content normal.          Assessment & Plan:   Protracted cough: Check labs, check CXR 2 view. Check quantiferon gold. Give Zpack  I spent greater than 40 minutes with the patient including greater than 50% of time in face to face counsel of the patient and in coordination of their care.   Protracted vaginal bleeding irregular menses: check urine pregnancy test. Could be going thru menopause as well  HIV: meds refilled, check VL and CD4 count continue Prezista, Norvir and truvada

## 2013-05-18 LAB — HIV-1 RNA QUANT-NO REFLEX-BLD: HIV-1 RNA Quant, Log: <1.3 {Log} (ref <1.30)

## 2013-05-18 LAB — RPR

## 2013-05-21 LAB — T-HELPER CELL (CD4) - (RCID CLINIC ONLY)
CD4 T CELL HELPER: 28 % — AB (ref 33–55)
CD4 T Cell Abs: 800 /uL (ref 400–2700)

## 2013-05-21 LAB — QUANTIFERON TB GOLD ASSAY (BLOOD)
Interferon Gamma Release Assay: NEGATIVE
Mitogen value: 8.12 IU/mL
Quantiferon Nil Value: 0.05 IU/mL
Quantiferon Tb Ag Minus Nil Value: 0 IU/mL
TB Ag value: 0.04 IU/mL

## 2013-05-24 ENCOUNTER — Other Ambulatory Visit: Payer: Self-pay | Admitting: *Deleted

## 2013-05-24 ENCOUNTER — Telehealth: Payer: Self-pay | Admitting: *Deleted

## 2013-05-24 NOTE — Telephone Encounter (Signed)
Patient states she is not taking Vitamin D at this time.  She thought that she was taking the 50,000 units for bone pain, states that she is having difficulty braiding her hair.  RN advised patient to NOT start the Vitamin D, I cancelled the medication on her list.  I advised her to bring up her symptoms at her next visit in 1 week.

## 2013-05-24 NOTE — Telephone Encounter (Signed)
I need to know her vitamin D levels first

## 2013-05-24 NOTE — Telephone Encounter (Signed)
She actually has a high NORMAL vitamin D I will absolutely NOT rx the high dose as I dont want to poison her.   Is she taking ANYvitamin D right now. Because if not she does NOT need to

## 2013-05-24 NOTE — Telephone Encounter (Signed)
Pt's husband called, requesting the vitamin D prescription be changed from 5,000 units to 50,000 units.  Please advise. Andree CossHowell, Keaisha Sublette M, RN

## 2013-05-24 NOTE — Telephone Encounter (Signed)
She is taking the vitamin D.  Pt's husband stated it was a prescription for 50,000 units, but that is not what was prescribed by you.

## 2013-05-31 ENCOUNTER — Ambulatory Visit
Admission: RE | Admit: 2013-05-31 | Discharge: 2013-05-31 | Disposition: A | Discharge status: Home / Self Care | Payer: Medicaid Other | Source: Ambulatory Visit | Attending: Infectious Disease | Admitting: Infectious Disease

## 2013-05-31 DIAGNOSIS — B20 Human immunodeficiency virus [HIV] disease: Secondary | ICD-10-CM

## 2013-06-04 ENCOUNTER — Encounter: Payer: Self-pay | Admitting: Infectious Disease

## 2013-06-04 ENCOUNTER — Ambulatory Visit (INDEPENDENT_AMBULATORY_CARE_PROVIDER_SITE_OTHER): Payer: Medicaid Other | Admitting: Infectious Disease

## 2013-06-04 ENCOUNTER — Encounter: Payer: Self-pay | Admitting: Licensed Clinical Social Worker

## 2013-06-04 VITALS — BP 105/72 | HR 73 | Temp 97.9°F | Wt 191.0 lb

## 2013-06-04 DIAGNOSIS — G63 Polyneuropathy in diseases classified elsewhere: Secondary | ICD-10-CM

## 2013-06-04 DIAGNOSIS — F411 Generalized anxiety disorder: Secondary | ICD-10-CM

## 2013-06-04 DIAGNOSIS — R0609 Other forms of dyspnea: Secondary | ICD-10-CM | POA: Insufficient documentation

## 2013-06-04 DIAGNOSIS — R5381 Other malaise: Secondary | ICD-10-CM

## 2013-06-04 DIAGNOSIS — R609 Edema, unspecified: Secondary | ICD-10-CM

## 2013-06-04 DIAGNOSIS — F329 Major depressive disorder, single episode, unspecified: Secondary | ICD-10-CM

## 2013-06-04 DIAGNOSIS — R059 Cough, unspecified: Secondary | ICD-10-CM

## 2013-06-04 DIAGNOSIS — G4700 Insomnia, unspecified: Secondary | ICD-10-CM

## 2013-06-04 DIAGNOSIS — O99345 Other mental disorders complicating the puerperium: Secondary | ICD-10-CM

## 2013-06-04 DIAGNOSIS — R600 Localized edema: Secondary | ICD-10-CM

## 2013-06-04 DIAGNOSIS — R0989 Other specified symptoms and signs involving the circulatory and respiratory systems: Secondary | ICD-10-CM

## 2013-06-04 DIAGNOSIS — R5383 Other fatigue: Secondary | ICD-10-CM

## 2013-06-04 DIAGNOSIS — R05 Cough: Secondary | ICD-10-CM

## 2013-06-04 DIAGNOSIS — F32A Depression, unspecified: Secondary | ICD-10-CM

## 2013-06-04 DIAGNOSIS — F53 Postpartum depression: Secondary | ICD-10-CM

## 2013-06-04 DIAGNOSIS — B20 Human immunodeficiency virus [HIV] disease: Secondary | ICD-10-CM

## 2013-06-04 DIAGNOSIS — G909 Disorder of the autonomic nervous system, unspecified: Secondary | ICD-10-CM

## 2013-06-04 DIAGNOSIS — F3289 Other specified depressive episodes: Secondary | ICD-10-CM

## 2013-06-04 DIAGNOSIS — R053 Chronic cough: Secondary | ICD-10-CM

## 2013-06-04 LAB — PRO B NATRIURETIC PEPTIDE: Pro B Natriuretic peptide (BNP): 160.1 pg/mL — ABNORMAL HIGH (ref <126)

## 2013-06-04 LAB — TSH: TSH: 3.228 u[IU]/mL (ref 0.350–4.500)

## 2013-06-04 MED ORDER — AEROCHAMBER PLUS MISC: Status: AC

## 2013-06-04 MED ORDER — ALBUTEROL SULFATE HFA 108 (90 BASE) MCG/ACT IN AERS: 2.0000 | INHALATION_SPRAY | Freq: Four times a day (QID) | RESPIRATORY_TRACT | Status: DC | PRN

## 2013-06-04 MED ORDER — AMITRIPTYLINE HCL 50 MG PO TABS: 50.0000 mg | ORAL_TABLET | Freq: Every day | ORAL | Status: DC

## 2013-06-04 NOTE — Progress Notes (Signed)
Subjective:    Patient ID: [name] [name], female    DOB: 1970-07-07, 43 y.o.   MRN: [id number]  HPI  43 year old African lady with HIV on prezista, norvir and truvada who returns with her husband as a walk in acutely a few weeks ago.    She and her husband had moved to [place] and were in care there with Medicaid coverage.   She has had a protracted cough since November 2014 that has not resolved.   She and her husband blame the flu vaccine which she received in August and after which she felt sick for a week. I told her and her husband that her current ssx IN NO way could be related to her flu vaccine. I suggested that her recently acquired habit of smoking tobacco could be playing a significant role t exclude that she might have bacterial bronchitis, PNA.  We performed a chest x-ray which was clear we did test for QuantiFeron which was negative.  She returns today for followup. She's been unable to tolerate the hydrocodone-containing cough syrup to suppress her cough she continues to have cough as well as dyspnea on exertion and lower sternum edema.  I cannot see that she's ever had a workup for possible heart failure and going to work her up with a probe beta natruretic peptide as well as an echocardiogram.  Alice remained with concern for possible reactive airway disease and recommended a trial of a bronchodilator for the patient.  We also reviewed her antiretroviral regimen and her recent labs which show that she still has perfect virological suppression with a viral load less than 20 and a CD4 count is above 800.  Multiple complaints today in addition to the respiratory ones above. She also feels exhausted a lot of the time.  She is painful what sounds like neuropathic pain in her hands bilaterally. She has achiness in her joints.  She endorses being depressed but denies having homicidal or suicidal ideation. She has insomnia anhedonia and malaise.  She is also suffering from  lower quadrant bilateral pain that she states she has had since she delivered her baby.  She is requesting also for a DEXA scan to check her bone mineral density     Review of Systems  Constitutional: Negative for fever, chills, diaphoresis, activity change, appetite change, fatigue and unexpected weight change.  HENT: Negative for congestion, rhinorrhea, sinus pressure, sneezing, sore throat and trouble swallowing.   Eyes: Negative for visual disturbance.  Respiratory: Positive for cough and shortness of breath. Negative for chest tightness, wheezing and stridor.   Cardiovascular: Positive for palpitations and leg swelling. Negative for chest pain.  Gastrointestinal: Positive for abdominal pain and abdominal distention. Negative for nausea, vomiting, diarrhea, constipation, blood in stool and anal bleeding.  Genitourinary: Negative for dysuria, flank pain and difficulty urinating.  Musculoskeletal: Negative for arthralgias, back pain, gait problem, joint swelling and myalgias.  Skin: Negative for color change, pallor, rash and wound.  Neurological: Negative for dizziness, tremors, weakness and light-headedness.  Hematological: Negative for adenopathy. Does not bruise/bleed easily.  Psychiatric/Behavioral: Positive for dysphoric mood and decreased concentration. Negative for suicidal ideas, hallucinations, confusion, sleep disturbance, self-injury and agitation. The patient is nervous/anxious. The patient is not hyperactive.        Objective:   Physical Exam  Constitutional: She is oriented to person, place, and time. She appears well-developed and well-nourished. No distress.  HENT:  Head: Normocephalic and atraumatic.  Mouth/Throat: Oropharynx is clear and moist.  No oropharyngeal exudate.  Eyes: Conjunctivae and EOM are normal. Pupils are equal, round, and reactive to light. No scleral icterus.  Neck: Normal range of motion. Neck supple. No JVD present.  Cardiovascular: Normal rate,  regular rhythm and normal heart sounds.  Exam reveals no gallop and no friction rub.   No murmur heard. Pulmonary/Chest: Effort normal and breath sounds normal. No respiratory distress. She has no wheezes. She has no rales.  Deep breathing elicits protracted coughing spells  Abdominal: She exhibits no distension and no mass. There is tenderness in the right lower quadrant and left lower quadrant. There is no rebound and no guarding.  Musculoskeletal: She exhibits no edema and no tenderness.  Lymphadenopathy:    She has no cervical adenopathy.  Neurological: She is alert and oriented to person, place, and time. She has normal reflexes. She exhibits normal muscle tone. Coordination normal.  Skin: Skin is warm and dry. She is not diaphoretic. No erythema. No pallor.  Psychiatric: Her behavior is normal. Judgment and thought content normal. Her mood appears anxious. She exhibits a depressed mood.          Assessment & Plan:   Protracted cough: I am concerned she might have undiagnosed heart failure undiagnosed reactive airway disease. I'm not happy that she's been smoking.  We'll check a pro BNP and echocardiogram.I will give her trial of albuterol   I spent greater than 40 minutes with the patient including greater than 50% of time in face to face counsel of the patient and in coordination of their care.   HIV: meds refilled, And she is doing well with perfect virological suppression and healthy CD4 count continuePrezista, Norvir and truvad. I will review her prior genotypes and if she can reliably be followed in clinic with proper suppression and tight appearance to medications he does consider a single tablet regimen.I am a bit bothered that she does not take medicines as religiously asshe might need to with a single tablet regimen.  Malaise and fatigue, and most of this is do to depression I will restart her on amitriptyline at bedtime and she will see Bernette RedbirdKenny our counselor. We will also  check TSH  Neuropathic pain: We'll start amitriptyline see if this helps.  DOE: As described above the component of heart failure versus reactive airway disease.  Desire for DEXA: at risk for having a osteopenia or osteoporosis with her HIV infection and vitamin Dthat was previously low currently is normal.  We can schedule DEXA scan  Healthcare maintenance will get a pelvic exam with patchy air setup with our nurse Angelique Blonderenise and then referred to gynecology as per her request.  The lateral suprapubic pain that has persisted after her delivery of her child again will refer to gynecology but after we've clarified status of her most recent Pap smear

## 2013-06-05 ENCOUNTER — Other Ambulatory Visit: Payer: Self-pay | Admitting: Licensed Clinical Social Worker

## 2013-06-05 DIAGNOSIS — R0600 Dyspnea, unspecified: Secondary | ICD-10-CM

## 2013-06-05 DIAGNOSIS — M810 Age-related osteoporosis without current pathological fracture: Secondary | ICD-10-CM

## 2013-06-06 ENCOUNTER — Other Ambulatory Visit: Payer: Self-pay

## 2013-06-06 ENCOUNTER — Ambulatory Visit (HOSPITAL_COMMUNITY): Payer: Medicaid Other | Attending: Cardiology | Admitting: Radiology

## 2013-06-06 ENCOUNTER — Encounter: Payer: Self-pay | Admitting: Cardiology

## 2013-06-06 ENCOUNTER — Other Ambulatory Visit (HOSPITAL_COMMUNITY): Payer: Self-pay | Admitting: Infectious Disease

## 2013-06-06 DIAGNOSIS — R059 Cough, unspecified: Secondary | ICD-10-CM | POA: Insufficient documentation

## 2013-06-06 DIAGNOSIS — R0602 Shortness of breath: Secondary | ICD-10-CM

## 2013-06-06 DIAGNOSIS — Z21 Asymptomatic human immunodeficiency virus [HIV] infection status: Secondary | ICD-10-CM | POA: Insufficient documentation

## 2013-06-06 DIAGNOSIS — I059 Rheumatic mitral valve disease, unspecified: Secondary | ICD-10-CM | POA: Insufficient documentation

## 2013-06-06 DIAGNOSIS — F17200 Nicotine dependence, unspecified, uncomplicated: Secondary | ICD-10-CM | POA: Insufficient documentation

## 2013-06-06 DIAGNOSIS — R05 Cough: Secondary | ICD-10-CM | POA: Insufficient documentation

## 2013-06-06 DIAGNOSIS — R0609 Other forms of dyspnea: Secondary | ICD-10-CM | POA: Insufficient documentation

## 2013-06-06 DIAGNOSIS — R609 Edema, unspecified: Secondary | ICD-10-CM | POA: Insufficient documentation

## 2013-06-06 DIAGNOSIS — R0989 Other specified symptoms and signs involving the circulatory and respiratory systems: Secondary | ICD-10-CM | POA: Insufficient documentation

## 2013-06-06 DIAGNOSIS — R002 Palpitations: Secondary | ICD-10-CM | POA: Insufficient documentation

## 2013-06-06 NOTE — Progress Notes (Signed)
Echocardiogram performed.  

## 2013-06-07 ENCOUNTER — Ambulatory Visit (HOSPITAL_COMMUNITY): Admission: RE | Admit: 2013-06-07 | Payer: Medicaid Other | Source: Ambulatory Visit

## 2013-06-25 ENCOUNTER — Ambulatory Visit (HOSPITAL_COMMUNITY)
Admission: RE | Admit: 2013-06-25 | Discharge: 2013-06-25 | Disposition: A | Discharge status: Home / Self Care | Payer: Medicaid Other | Source: Ambulatory Visit | Attending: Infectious Disease | Admitting: Infectious Disease

## 2013-06-25 DIAGNOSIS — Z780 Asymptomatic menopausal state: Secondary | ICD-10-CM | POA: Insufficient documentation

## 2013-06-25 DIAGNOSIS — Z13820 Encounter for screening for osteoporosis: Secondary | ICD-10-CM | POA: Insufficient documentation

## 2013-06-25 DIAGNOSIS — M810 Age-related osteoporosis without current pathological fracture: Secondary | ICD-10-CM

## 2013-07-09 ENCOUNTER — Inpatient Hospital Stay (HOSPITAL_COMMUNITY)
Admission: AD | Admit: 2013-07-09 | Discharge: 2013-07-09 | Disposition: A | Discharge status: Home / Self Care | Payer: Medicaid Other | Source: Ambulatory Visit | Attending: Obstetrics & Gynecology | Admitting: Obstetrics & Gynecology

## 2013-07-09 ENCOUNTER — Inpatient Hospital Stay (HOSPITAL_COMMUNITY): Payer: Medicaid Other

## 2013-07-09 ENCOUNTER — Encounter (HOSPITAL_COMMUNITY): Payer: Self-pay

## 2013-07-09 DIAGNOSIS — R112 Nausea with vomiting, unspecified: Secondary | ICD-10-CM | POA: Insufficient documentation

## 2013-07-09 DIAGNOSIS — F17200 Nicotine dependence, unspecified, uncomplicated: Secondary | ICD-10-CM | POA: Insufficient documentation

## 2013-07-09 DIAGNOSIS — Z21 Asymptomatic human immunodeficiency virus [HIV] infection status: Secondary | ICD-10-CM | POA: Insufficient documentation

## 2013-07-09 DIAGNOSIS — T50904A Poisoning by unspecified drugs, medicaments and biological substances, undetermined, initial encounter: Secondary | ICD-10-CM

## 2013-07-09 DIAGNOSIS — R42 Dizziness and giddiness: Secondary | ICD-10-CM | POA: Insufficient documentation

## 2013-07-09 DIAGNOSIS — R109 Unspecified abdominal pain: Secondary | ICD-10-CM | POA: Insufficient documentation

## 2013-07-09 DIAGNOSIS — R300 Dysuria: Secondary | ICD-10-CM | POA: Insufficient documentation

## 2013-07-09 DIAGNOSIS — T50905A Adverse effect of unspecified drugs, medicaments and biological substances, initial encounter: Secondary | ICD-10-CM

## 2013-07-09 HISTORY — DX: Depression, unspecified: F32.A

## 2013-07-09 HISTORY — DX: Major depressive disorder, single episode, unspecified: F32.9

## 2013-07-09 LAB — URINALYSIS, ROUTINE W REFLEX MICROSCOPIC
BILIRUBIN URINE: NEGATIVE
Glucose, UA: NEGATIVE mg/dL
Hgb urine dipstick: NEGATIVE
Ketones, ur: NEGATIVE mg/dL
Leukocytes, UA: NEGATIVE
NITRITE: NEGATIVE
PH: 5.5 (ref 5.0–8.0)
Protein, ur: NEGATIVE mg/dL
Urobilinogen, UA: 0.2 mg/dL (ref 0.0–1.0)

## 2013-07-09 LAB — WET PREP, GENITAL
Clue Cells Wet Prep HPF POC: NONE SEEN
TRICH WET PREP: NONE SEEN

## 2013-07-09 LAB — POCT PREGNANCY, URINE: PREG TEST UR: NEGATIVE

## 2013-07-09 MED ORDER — FLUCONAZOLE 150 MG PO TABS: 150.0000 mg | ORAL_TABLET | Freq: Once | ORAL | Status: DC

## 2013-07-09 MED ORDER — IBUPROFEN 600 MG PO TABS
600.0000 mg | ORAL_TABLET | Freq: Once | ORAL | Status: AC
Start: 2013-07-09 — End: 2013-07-09
  Administered 2013-07-09: 600 mg via ORAL
  Filled 2013-07-09: qty 1

## 2013-07-09 NOTE — Discharge Instructions (Signed)

## 2013-07-09 NOTE — MAU Note (Addendum)
md changed her meds 1 month ago, now has N&V and abd pain, dizziness and drowiness. abd pain lower left with radiating to upper abd

## 2013-07-09 NOTE — MAU Provider Note (Signed)
History   [name] [name] is a 43 yo AA female presenting to the MAU for left side abdominal pain starting in the lower pelvis and radiating to the left umbilical region.  The patient has been having abdominal pain off and on since the RLTCS of her child in 2013, but states that the pain has gotten worse over the last few days. She has some relief with ibuprofen.  She says that the pain interferes with her driving and often keeps her up at night. [name] [name] t is HIV positive and her ID doctor recently changed her HIV medications.  She typically experiences nausea on her meds, but has been vomiting for the last 24-48 hours.  The patient also reports dark urine and pain/burning with urination. She does not have a PCP, but has scheduled an appt with a new one (Dr. Deboraha Sprang) for March 10th.  CSN: [id number]  Arrival date and time: 07/09/13 1641   First Provider Initiated Contact with Patient 07/09/13 2050      Chief Complaint  Patient presents with  . Dizziness  . Abdominal Pain   HPI   Past Medical History  Diagnosis Date  . Anxiety   . Preterm labor   . HIV (human immunodeficiency virus infection)   . Blood transfusion 1997    contracted HIV from transfusion  . Anemia   . Depression     Past Surgical History  Procedure Laterality Date  . Cesarean section  04/2000  . Cesarean section  08/18/2011    Procedure: CESAREAN SECTION;  Surgeon: Reva Bores, MD;  Location: [facility];  Service: Gynecology;  Laterality: N/A;  Repeat.  Will need AZT 3 hours before    Family History  Problem Relation Age of Onset  . Other Neg Hx     History  Substance Use Topics  . Smoking status: Current Every Day Smoker  . Smokeless tobacco: Never Used  . Alcohol Use: No    Allergies:  Allergies  Allergen Reactions  . Influenza Vaccine Live Other (See Comments)    Claims to have had very bad congestion and headaches for 7 days post each of her last flu shots in 2011 and 2002 REFUSES  . Latex Rash     Prescriptions prior to admission  Medication Sig Dispense Refill  . albuterol (PROVENTIL HFA;VENTOLIN HFA) 108 (90 BASE) MCG/ACT inhaler Inhale 2 puffs into the lungs every 6 (six) hours as needed for wheezing or shortness of breath.  1 Inhaler  6  . amitriptyline (ELAVIL) 50 MG tablet Take 1 tablet (50 mg total) by mouth at bedtime.  30 tablet  11  . Darunavir Ethanolate (PREZISTA) 800 MG tablet Take 1 tablet (800 mg total) by mouth daily.  30 tablet  11  . docusate sodium (COLACE) 100 MG capsule Take 100 mg by mouth 2 (two) times daily as needed. For constipation      . emtricitabine-tenofovir (TRUVADA) 200-300 MG per tablet Take 1 tablet by mouth daily.  30 tablet  11  . FOLIC ACID PO Take 1 tablet by mouth daily.      Marland Kitchen ibuprofen (ADVIL,MOTRIN) 600 MG tablet Take 600 mg by mouth every 6 (six) hours as needed. For pain.      . Multiple Vitamin (MULTIVITAMIN WITH MINERALS) TABS Take 1 tablet by mouth daily.      . ritonavir (NORVIR) 100 MG TABS tablet Take 1 tablet (100 mg total) by mouth daily.  30 tablet  11  . Spacer/Aero-Holding Chambers (AEROCHAMBER  PLUS) inhaler Use as instructed  1 each  2    Review of Systems  Constitutional: Positive for malaise/fatigue.  HENT: Negative.   Eyes: Negative.   Respiratory: Negative.   Cardiovascular: Negative.   Gastrointestinal: Positive for nausea, vomiting and abdominal pain.  Genitourinary: Positive for dysuria and frequency.  Musculoskeletal: Positive for back pain.  Skin: Negative.   Neurological: Positive for tingling.  Endo/Heme/Allergies: Negative.   Psychiatric/Behavioral: Negative.    Physical Exam   Blood pressure 110/77, pulse 80, temperature 98.3 F (36.8 C), resp. rate 18, last menstrual period 06/18/2013.  Physical Exam  Constitutional: She is oriented to person, place, and time. She appears well-developed and well-nourished.  HENT:  Head: Normocephalic and atraumatic.  Eyes: Conjunctivae are normal.  Neck: Normal  range of motion.  Cardiovascular: Normal rate and regular rhythm.   Respiratory: Effort normal and breath sounds normal.  GI: Soft. There is tenderness. There is guarding.  Abdominal tenderness with light palpation on left side.  Genitourinary: Vaginal discharge found.  Thick white coating only on cervix. Left adnexal and cervical motion tenderness on bimanual.  Musculoskeletal: She exhibits tenderness.  CVAT  Neurological: She is alert and oriented to person, place, and time.  Skin: Skin is warm and dry.  Psychiatric: She has a normal mood and affect. Her behavior is normal. Judgment normal.    MAU Course  Procedures  MDM Results for orders placed during the hospital encounter of 07/09/13 (from the past 24 hour(s))  URINALYSIS, ROUTINE W REFLEX MICROSCOPIC     Status: Abnormal   Collection Time    07/09/13  8:15 PM      Result Value Ref Range   Color, Urine YELLOW  YELLOW   APPearance CLEAR  CLEAR   Specific Gravity, Urine >1.030 (*) 1.005 - 1.030   pH 5.5  5.0 - 8.0   Glucose, UA NEGATIVE  NEGATIVE mg/dL   Hgb urine dipstick NEGATIVE  NEGATIVE   Bilirubin Urine NEGATIVE  NEGATIVE   Ketones, ur NEGATIVE  NEGATIVE mg/dL   Protein, ur NEGATIVE  NEGATIVE mg/dL   Urobilinogen, UA 0.2  0.0 - 1.0 mg/dL   Nitrite NEGATIVE  NEGATIVE   Leukocytes, UA NEGATIVE  NEGATIVE  POCT PREGNANCY, URINE     Status: None   Collection Time    07/09/13  8:44 PM      Result Value Ref Range   Preg Test, Ur NEGATIVE  NEGATIVE  WET PREP, GENITAL     Status: Abnormal   Collection Time    07/09/13  9:30 PM      Result Value Ref Range   Yeast Wet Prep HPF POC FEW (*) NONE SEEN   Trich, Wet Prep NONE SEEN  NONE SEEN   Clue Cells Wet Prep HPF POC NONE SEEN  NONE SEEN   WBC, Wet Prep HPF POC FEW (*) NONE SEEN     Assessment and Plan  The encounter diagnosis was Drug-induced nausea and vomiting.  Discharge to home Keep scheduled appt with new PCP for March 10  Selena LesserBraimah, Tina 07/09/2013, 9:49  PM  I have seen the patient with the resident/student and agree with the above.  Tawnya CrookHogan, Heather Donovan

## 2013-07-10 ENCOUNTER — Telehealth: Payer: Self-pay | Admitting: Licensed Clinical Social Worker

## 2013-07-10 NOTE — Telephone Encounter (Signed)
I DID NOT CHANGE HER HIV MEDICATIONS!!!! She has been on Prezista, Norvir and Truvada for some time. She can come in as an overbook on Thursday .  She could take zofran 4mg  every 6 hours as needed if she has nausea.   AGAIN I DID NOT change her meds so I HAVE SERIOUS DOUBTS that any NEW symptoms are related to her HIV meds.   The MDS at ED, did not read her chart carefully. She told them I had changed HIV meds--but I DID NOT!!!

## 2013-07-10 NOTE — Telephone Encounter (Signed)
Patient was seen at [facility] Emergency room last night with nausea and abdominal pain. Patient states that she was dx with drug related nausea and abdominal pain. Patient would like to be seen to follow and figure out what to do about her medications that are making her sick. No appointments available until next week. Please advise.

## 2013-07-10 NOTE — MAU Provider Note (Signed)
Attestation of Attending Supervision of Advanced Practitioner (CNM/NP): Evaluation and management procedures were performed by the Advanced Practitioner under my supervision and collaboration.  I have reviewed the Advanced Practitioner's note and chart, and I agree with the management and plan.  [name] [name], [name] [name] 3:46 AM

## 2013-07-16 ENCOUNTER — Encounter: Payer: Self-pay | Admitting: Infectious Disease

## 2013-07-16 ENCOUNTER — Ambulatory Visit (INDEPENDENT_AMBULATORY_CARE_PROVIDER_SITE_OTHER): Payer: Medicaid Other | Admitting: Infectious Disease

## 2013-07-16 ENCOUNTER — Ambulatory Visit: Payer: Medicaid Other

## 2013-07-16 VITALS — BP 144/79 | HR 56 | Temp 97.9°F | Ht 63.0 in | Wt 193.0 lb

## 2013-07-16 DIAGNOSIS — R1011 Right upper quadrant pain: Secondary | ICD-10-CM

## 2013-07-16 DIAGNOSIS — G909 Disorder of the autonomic nervous system, unspecified: Principal | ICD-10-CM

## 2013-07-16 DIAGNOSIS — B20 Human immunodeficiency virus [HIV] disease: Secondary | ICD-10-CM

## 2013-07-16 DIAGNOSIS — F32A Depression, unspecified: Secondary | ICD-10-CM

## 2013-07-16 DIAGNOSIS — F329 Major depressive disorder, single episode, unspecified: Secondary | ICD-10-CM

## 2013-07-16 DIAGNOSIS — G5600 Carpal tunnel syndrome, unspecified upper limb: Secondary | ICD-10-CM

## 2013-07-16 DIAGNOSIS — G4710 Hypersomnia, unspecified: Secondary | ICD-10-CM

## 2013-07-16 DIAGNOSIS — G63 Polyneuropathy in diseases classified elsewhere: Secondary | ICD-10-CM

## 2013-07-16 DIAGNOSIS — F3289 Other specified depressive episodes: Secondary | ICD-10-CM

## 2013-07-16 DIAGNOSIS — R51 Headache: Secondary | ICD-10-CM

## 2013-07-16 DIAGNOSIS — R413 Other amnesia: Secondary | ICD-10-CM

## 2013-07-16 DIAGNOSIS — R41840 Attention and concentration deficit: Secondary | ICD-10-CM

## 2013-07-16 DIAGNOSIS — R400 Somnolence: Secondary | ICD-10-CM

## 2013-07-16 DIAGNOSIS — Z765 Malingerer [conscious simulation]: Secondary | ICD-10-CM

## 2013-07-16 DIAGNOSIS — G4700 Insomnia, unspecified: Secondary | ICD-10-CM

## 2013-07-16 NOTE — Progress Notes (Signed)
Subjective:    Patient ID: [name] [name], female    DOB: 06-04-1970, 43 y.o.   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 She apparently went to the ED because of severe abdominal pain and had TV and TA [place] which were normal, wet prep with a few WBC.   They speculated that her ssx, were due to adverse drug effect.  Despite this she had continued this Tricyclic.   She also endorses difficulty concentrating every day for months to years. She endorses problems with short term memory chronically and claims this is interfering with her school work.  She states that she sleeps poorly and states that she suffers from sleepiness throughout the day.  She c/o numbness, throbbing in her hands and swelling in her feet. THe former is worse with repetitive motions.   Review of Systems  Constitutional: Positive for activity change and fatigue. Negative for fever, chills, diaphoresis, appetite change and unexpected weight change.  HENT: Negative for congestion, sinus pressure, sneezing, sore throat and trouble swallowing.   Eyes: Negative for photophobia and visual disturbance.  Respiratory: Positive for apnea. Negative for cough.   Cardiovascular: Positive for leg swelling. Negative for palpitations.  Gastrointestinal: Positive for abdominal pain. Negative for nausea, vomiting, diarrhea and constipation.  Genitourinary: Negative for dysuria, hematuria, flank pain and difficulty urinating.  Musculoskeletal: Positive for arthralgias and joint swelling. Negative for back pain, gait problem and myalgias.  Skin: Negative for color change, pallor, rash and wound.  Neurological: Negative for dizziness, tremors, weakness and light-headedness.  Hematological: Negative for adenopathy. Does not bruise/bleed easily.  Psychiatric/Behavioral: Positive for confusion, dysphoric mood and decreased concentration. Negative for behavioral  problems, sleep disturbance and agitation.       Objective:   Physical Exam  Constitutional: She is oriented to person, place, and time. She appears well-developed and well-nourished. No distress.  HENT:  Head: Normocephalic and atraumatic.  Mouth/Throat: Oropharynx is clear and moist. No oropharyngeal exudate.  Eyes: Conjunctivae and EOM are normal. No scleral icterus.  Neck: Normal range of motion. Neck supple. No JVD present.  Cardiovascular: Normal rate, regular rhythm and normal heart sounds.  Exam reveals no gallop and no friction rub.   No murmur heard. Pulmonary/Chest: Effort normal and breath sounds normal. No respiratory distress. She has no wheezes.  Abdominal: Soft. She exhibits no distension.  Musculoskeletal: She exhibits edema. She exhibits no tenderness.       Right wrist: Normal.       Left wrist: Normal.       Left forearm: Normal. She exhibits no tenderness and no bony tenderness.  Lymphadenopathy:    She has no cervical adenopathy.  Neurological: She is alert and oriented to person, place, and time. She exhibits normal muscle tone. Coordination normal.  Skin: Skin is warm and dry. She is not diaphoretic. No erythema. No pallor.  Psychiatric: Judgment and thought content normal. She is slowed. She exhibits a depressed mood.          Assessment & Plan:   #1 HIV: perfect control continue current meds, simplify to Prezcobix and Truvada when these meds available thru medicaid and medicare   #2 Hand pain, foot pain: could be HIV related neuropathy. She also has activity related pain in hands, could ? Carpal tunnel syndrome  --carpal tunnel brace at night and consider gabapentin in future, dc amitryptiline for now (See below)  #3 Abdominal pain, headache,  and worsening confusion and insominia she attributes to amitryptiline: dc the tricyclic  #4 COgnitive problems: Could have HIV releated neurocognitive disorder, I am a bit bothered by her multitude of  complaints that all seem nonspecific, difficult to prove/disprove. I have suspicion that she is suffering from depression and could also be malingering I spent greater than 25 minutes with the patient including greater than 50% of time in face to face counsel of the patient and in coordination of their care.   Continue ARV and consider referral for formal Neurocognitive studies  Excessive somnolence: could be med related but also could be due to OSA--sleep study  DIsability: I filled out disabity form for her. With re to her HIV, only considerations would be if she HIV asscoiated neuropathy and neurocognitive disorder and if depression as consequence is also an issue  Depression: would like her to meet with Bernette RedbirdKenny and will consider SSRI

## 2013-07-17 ENCOUNTER — Ambulatory Visit: Payer: Medicaid Other

## 2013-07-24 IMAGING — US US OB FOLLOW-UP
1 series · 12 of 28 positions shown · non-contrast
Comparison: none

[Series 1: us ob follow up · 12 of 40 slices shown]
[im 2/40]
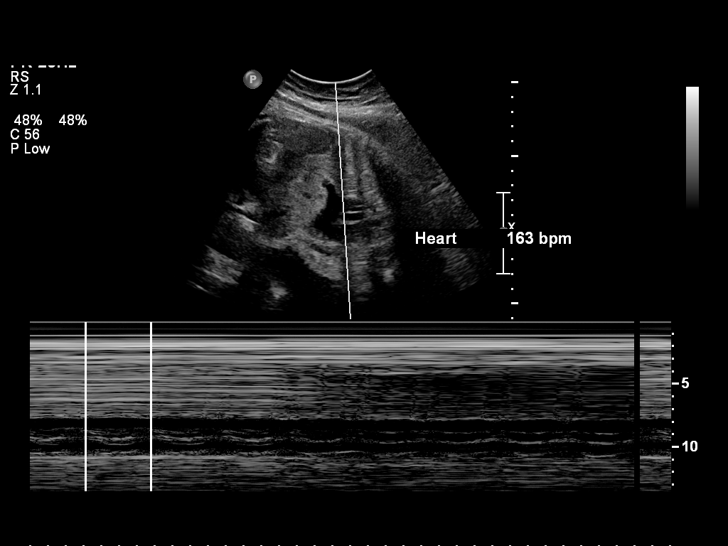
[im 5/40]
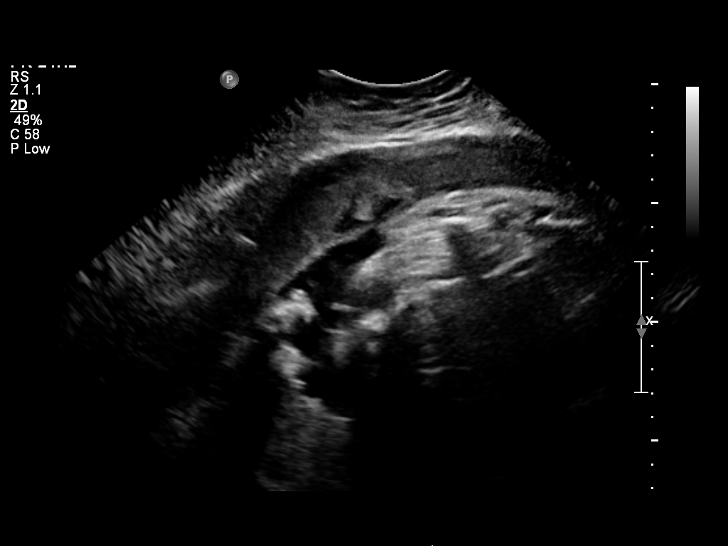
[im 8/40]
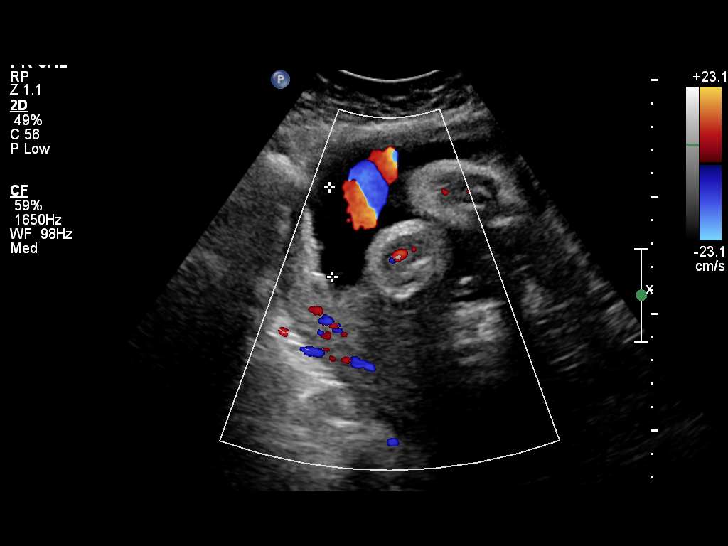
[im 12/40]
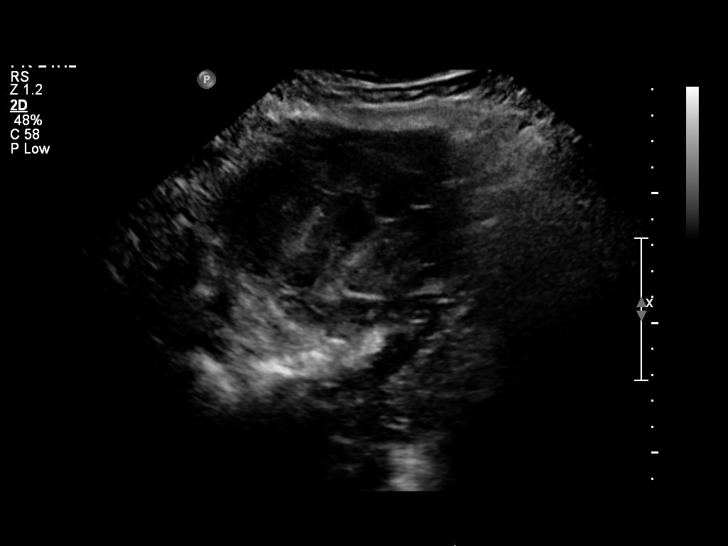
[im 15/40]
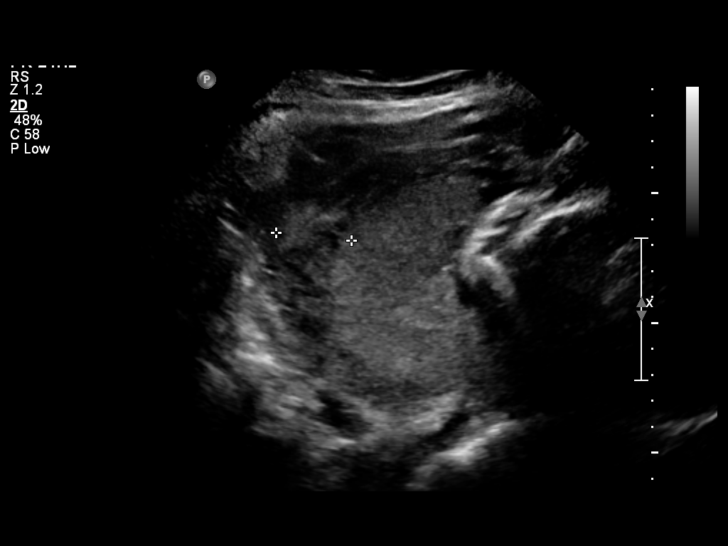
[im 18/40]
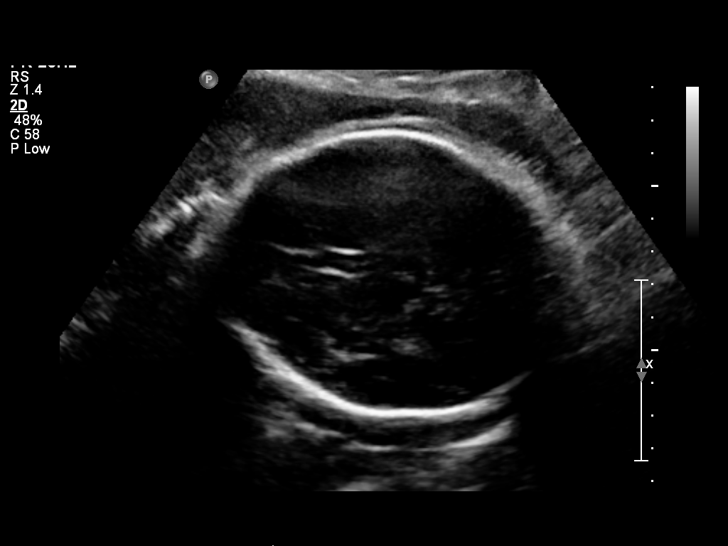
[im 22/40]
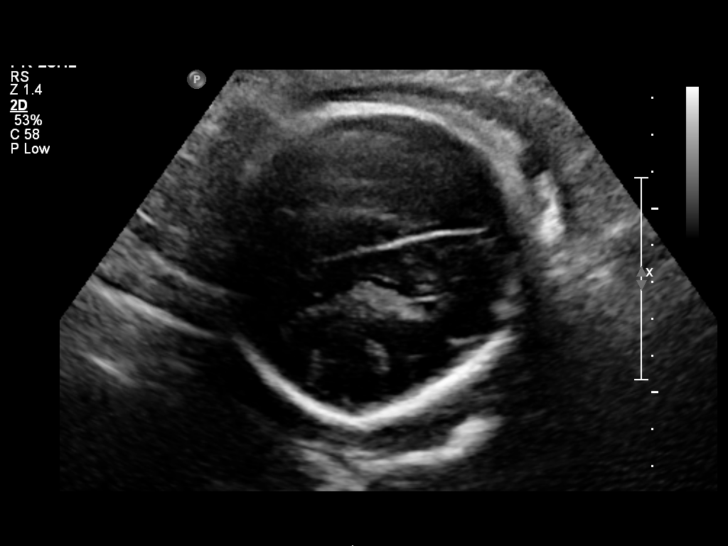
[im 25/40]
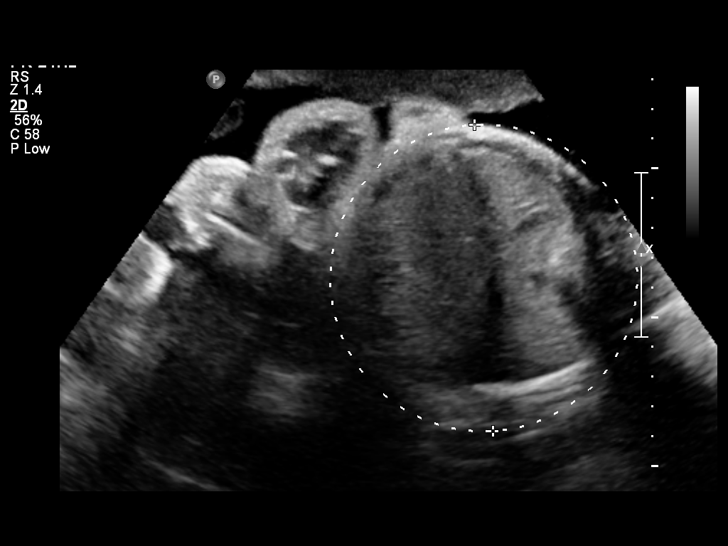
[im 28/40]
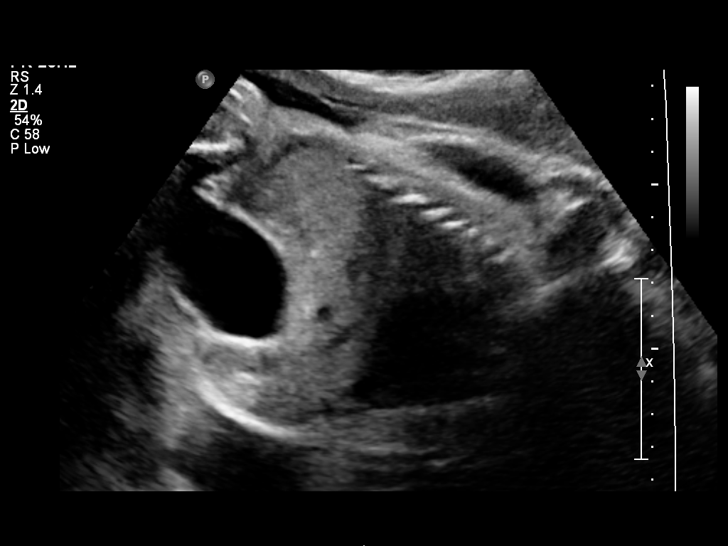
[im 32/40]
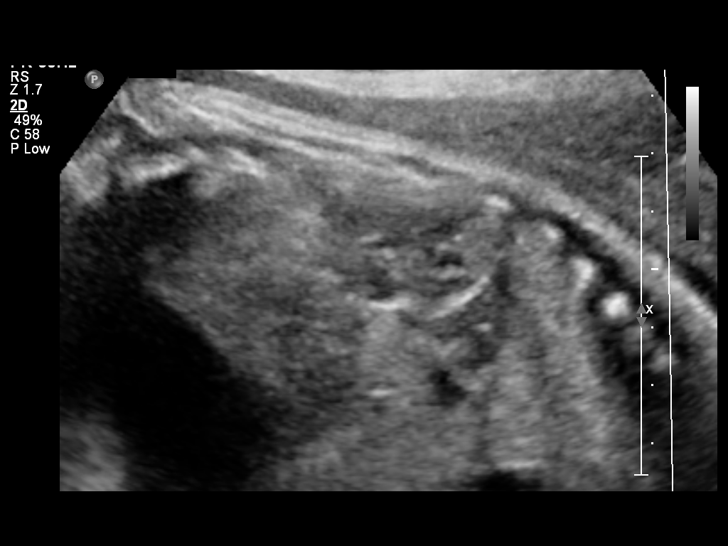
[im 35/40]
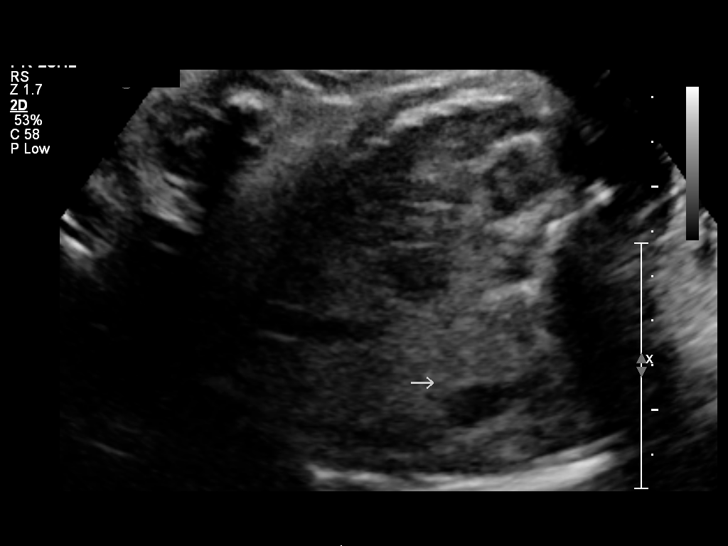
[im 38/40]
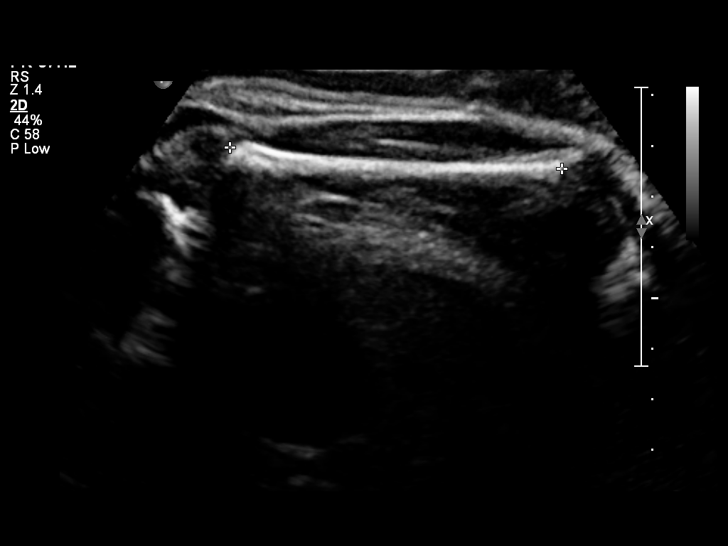

[12 of 28 positions shown; findings below may reference images not displayed]

ID:                                         D.O.B.:

 Order#:         [id number]
Procedures

 US OB FOLLOW UP                                       76816.1
Indications

 Assess Fetal Growth / Estimated Fetal Weight
 HIV: Asymptomatic                                     647.63 V08
Fetal Evaluation

 Fetal Heart Rate:  163                         bpm
 Cardiac Activity:  Observed
 Presentation:      Cephalic
 Placenta:          Anterior, above cervical os
 P. Cord            Visualized
 Insertion:

 Amniotic Fluid
 AFI FV:      Subjectively within normal limits
 AFI Sum:     13.44   cm      47   %Tile     Larg Pckt:   3.88   cm
 RUQ:   3.82   cm    RLQ:    3.88   cm    LUQ:   2.64    cm   LLQ:    3.1    cm
Biometry

 BPD:       84  mm    G. Age:   33w 6d                CI:        71.86   70 - 86
                                                      FL/HC:      20.6   20.1 -

 HC:     315.4  mm    G. Age:   35w 3d       11  %    HC/AC:      0.96   0.93 -

 AC:     327.6  mm    G. Age:   36w 5d       77  %    FL/BPD:     77.4   71 - 87
 FL:        65  mm    G. Age:   33w 4d        3  %    FL/AC:      19.8   20 - 24

 Est. FW:    6049  gm    5 lb 14 oz      52  %
Gestational Age

 LMP:           35w 1d       Date:   11/22/10                 EDD:   08/29/11
 Clinical EDD:  36w 0d                                        EDD:   08/23/11
 U/S Today:     34w 6d                                        EDD:   08/31/11
 Best:          36w 0d    Det. By:   Clinical EDD             EDD:   08/23/11
Anatomy

 Cranium:           Appears normal      Aortic Arch:       Previously seen
 Fetal Cavum:       Appears normal      Ductal Arch:       Previously seen
 Ventricles:        Appears normal      Diaphragm:         Appears normal
 Choroid Plexus:    Previously seen     Stomach:           Appears
                                                           normal, left
                                                           sided
 Cerebellum:        Previously seen     Abdomen:           Previously seen
 Posterior Fossa:   Previously seen     Abdominal Wall:    Previously seen
 Nuchal Fold:       Not applicable      Cord Vessels:      Previously seen
                    (>20 wks GA)
 Face:              Lips and orbits     Kidneys:           Appear normal
                    previously seen
 Heart:             Previously seen     Bladder:           Appears normal
 RVOT:              Previously seen     Spine:             Previously seen
 LVOT:              Previously seen     Limbs:             Previously seen

 Other:     Female gender. Heels prev visualized.
Cervix Uterus Adnexa

 Cervix:       Not visualized (advanced GA >34 wks)

 Left Ovary:   Within normal limits.
 Right Ovary:  Within normal limits.
 Adnexa:     No abnormality visualized.
Impression

 Single live IUP in cephalic presentation.  Concordant
 measurements/assigned GA by LMP.
 No late-developing anomaly in visualized structures above.

 concerns.

## 2013-07-30 ENCOUNTER — Ambulatory Visit: Payer: Medicaid Other

## 2013-07-30 DIAGNOSIS — F4310 Post-traumatic stress disorder, unspecified: Secondary | ICD-10-CM

## 2013-07-30 DIAGNOSIS — F331 Major depressive disorder, recurrent, moderate: Secondary | ICD-10-CM
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
## 2013-08-19 ENCOUNTER — Ambulatory Visit (HOSPITAL_BASED_OUTPATIENT_CLINIC_OR_DEPARTMENT_OTHER): Payer: Medicaid Other | Attending: Infectious Disease

## 2013-08-19 VITALS — Ht 63.0 in | Wt 186.0 lb

## 2013-08-19 DIAGNOSIS — G4730 Sleep apnea, unspecified: Principal | ICD-10-CM

## 2013-08-19 DIAGNOSIS — G4710 Hypersomnia, unspecified: Secondary | ICD-10-CM | POA: Insufficient documentation

## 2013-08-25 DIAGNOSIS — G4710 Hypersomnia, unspecified: Secondary | ICD-10-CM

## 2013-08-25 NOTE — Sleep Study (Signed)
   NAME: [name] [name] DATE OF BIRTH:  1970-05-24 MEDICAL RECORD NUMBER [id number]  LOCATION: [facility]  PHYSICIAN: Elyon Zoll D Eutimio Gharibian  DATE OF STUDY: 08/19/2013  SLEEP STUDY TYPE: Nocturnal Polysomnogram               REFERRING PHYSICIAN: Daiva EvesVan Dam, Lisette Grinderornelius N, MD  INDICATION FOR STUDY: Hypersomnia with sleep apnea  EPWORTH SLEEPINESS SCORE:   11/24 HEIGHT: 5\' 3"  (160 cm)  WEIGHT: 186 lb (84.369 kg)    Body mass index is 32.96 kg/(m^2).  NECK SIZE: 13 in.  MEDICATIONS: Charted for review  SLEEP ARCHITECTURE: Total sleep time 269.5 minutes with sleep efficiency 74.9%. Stage I was 7.1%, stage II 65.5%, stage III 14.7%, REM 12.8% of total sleep time. Sleep latency 27.5 minutes, REM latency 116 minutes, awake after sleep onset 63 minutes, arousal index 7.3. Bedtime medication: None  RESPIRATORY DATA: Apnea hypopnea index (AHI) 0.2 per hour. A single event was scored as a central apnea while supine. REM AHI 1.7 per hour. CPAP titration was not done .  OXYGEN DATA: Mild snoring with oxygen desaturation to a nadir of 87% and mean oxygen saturation through the study of 95.1% on room air.  CARDIAC DATA: Normal sinus rhythm  MOVEMENT/PARASOMNIA: A few incidental limb jerks were noted with no effect on sleep. Bathroom x1.  IMPRESSION/ RECOMMENDATION:   1) A single respiratory event was scored as a central apnea, within normal limits, AHI 0.1 per hour. The normal range for adults is an AHI from 0-5 events per hour). Mild snoring with oxygen desaturation to a nadir of 87% and mean oxygen saturation through the study of 95.1% on room air.  2) Sleep architecture was not remarkable for sleep center environment. Time spent in REM was somewhat less than expected, but this has little significance on a single night.  Signed Jetty Duhamellinton Tila Millirons M.D. Waymon Budgelinton D Anaaya Fuster Diplomate, Biomedical engineerAmerican Board of Sleep Medicine  ELECTRONICALLY SIGNED ON:  08/25/2013, 3:08 PM  SLEEP DISORDERS  CENTER PH: [phone]   FX: [phone] ACCREDITED BY THE AMERICAN ACADEMY OF SLEEP MEDICINE

## 2013-09-03 ENCOUNTER — Ambulatory Visit: Payer: Medicaid Other

## 2013-09-03 DIAGNOSIS — F4310 Post-traumatic stress disorder, unspecified: Secondary | ICD-10-CM

## 2013-09-03 DIAGNOSIS — F331 Major depressive disorder, recurrent, moderate: Secondary | ICD-10-CM
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
## 2013-09-11 ENCOUNTER — Ambulatory Visit: Payer: Medicaid Other

## 2013-10-09 ENCOUNTER — Ambulatory Visit: Payer: Medicaid Other

## 2013-10-09 DIAGNOSIS — F331 Major depressive disorder, recurrent, moderate: Secondary | ICD-10-CM

## 2013-10-09 DIAGNOSIS — F4310 Post-traumatic stress disorder, unspecified: Secondary | ICD-10-CM
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
## 2013-10-23 ENCOUNTER — Ambulatory Visit: Payer: Medicaid Other

## 2013-12-04 ENCOUNTER — Ambulatory Visit: Payer: Medicaid Other

## 2014-01-16 ENCOUNTER — Ambulatory Visit (INDEPENDENT_AMBULATORY_CARE_PROVIDER_SITE_OTHER): Payer: Medicaid Other | Admitting: Infectious Disease

## 2014-01-16 ENCOUNTER — Encounter: Payer: Self-pay | Admitting: Infectious Disease

## 2014-01-16 VITALS — BP 113/78 | HR 69 | Temp 98.3°F | Wt 196.0 lb

## 2014-01-16 DIAGNOSIS — J4521 Mild intermittent asthma with (acute) exacerbation: Secondary | ICD-10-CM

## 2014-01-16 DIAGNOSIS — R5383 Other fatigue: Secondary | ICD-10-CM

## 2014-01-16 DIAGNOSIS — F329 Major depressive disorder, single episode, unspecified: Secondary | ICD-10-CM

## 2014-01-16 DIAGNOSIS — Z21 Asymptomatic human immunodeficiency virus [HIV] infection status: Secondary | ICD-10-CM

## 2014-01-16 DIAGNOSIS — F3289 Other specified depressive episodes: Secondary | ICD-10-CM

## 2014-01-16 DIAGNOSIS — R5381 Other malaise: Secondary | ICD-10-CM

## 2014-01-16 DIAGNOSIS — F32A Depression, unspecified: Secondary | ICD-10-CM

## 2014-01-16 DIAGNOSIS — J45901 Unspecified asthma with (acute) exacerbation: Secondary | ICD-10-CM

## 2014-01-16 DIAGNOSIS — B20 Human immunodeficiency virus [HIV] disease: Secondary | ICD-10-CM

## 2014-01-16 DIAGNOSIS — G4700 Insomnia, unspecified: Secondary | ICD-10-CM

## 2014-01-16 MED ORDER — ESCITALOPRAM OXALATE 10 MG PO TABS: 10.0000 mg | ORAL_TABLET | Freq: Every day | ORAL | Status: DC

## 2014-01-16 MED ORDER — DARUNAVIR-COBICISTAT 800-150 MG PO TABS: 1.0000 | ORAL_TABLET | Freq: Every day | ORAL | Status: DC

## 2014-01-16 MED ORDER — BECLOMETHASONE DIPROPIONATE 40 MCG/ACT IN AERS: 2.0000 | INHALATION_SPRAY | Freq: Two times a day (BID) | RESPIRATORY_TRACT | Status: DC
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
## 2014-01-30 DIAGNOSIS — T7411XA Adult physical abuse, confirmed, initial encounter: Secondary | ICD-10-CM | POA: Insufficient documentation

## 2014-01-31 ENCOUNTER — Encounter: Payer: Self-pay | Admitting: *Deleted

## 2014-02-14 ENCOUNTER — Other Ambulatory Visit: Payer: Medicaid Other

## 2014-02-28 ENCOUNTER — Ambulatory Visit: Payer: Medicaid Other | Admitting: Infectious Disease

## 2014-03-03 ENCOUNTER — Emergency Department (HOSPITAL_COMMUNITY)
Admission: EM | Admit: 2014-03-03 | Discharge: 2014-03-03 | Disposition: A | Discharge status: Home / Self Care | Payer: Medicaid Other | Attending: Emergency Medicine | Admitting: Emergency Medicine

## 2014-03-03 ENCOUNTER — Emergency Department (HOSPITAL_COMMUNITY): Payer: Medicaid Other

## 2014-03-03 ENCOUNTER — Encounter (HOSPITAL_COMMUNITY): Payer: Self-pay | Admitting: Emergency Medicine

## 2014-03-03 DIAGNOSIS — Z21 Asymptomatic human immunodeficiency virus [HIV] infection status: Secondary | ICD-10-CM | POA: Diagnosis not present

## 2014-03-03 DIAGNOSIS — Z91040 Latex allergy status: Secondary | ICD-10-CM | POA: Insufficient documentation

## 2014-03-03 DIAGNOSIS — R1110 Vomiting, unspecified: Secondary | ICD-10-CM | POA: Insufficient documentation

## 2014-03-03 DIAGNOSIS — Z79899 Other long term (current) drug therapy: Secondary | ICD-10-CM | POA: Diagnosis not present

## 2014-03-03 DIAGNOSIS — R079 Chest pain, unspecified: Secondary | ICD-10-CM | POA: Diagnosis present

## 2014-03-03 DIAGNOSIS — F329 Major depressive disorder, single episode, unspecified: Secondary | ICD-10-CM | POA: Insufficient documentation

## 2014-03-03 DIAGNOSIS — Z3202 Encounter for pregnancy test, result negative: Secondary | ICD-10-CM | POA: Insufficient documentation

## 2014-03-03 DIAGNOSIS — R0789 Other chest pain: Secondary | ICD-10-CM | POA: Diagnosis not present

## 2014-03-03 DIAGNOSIS — R61 Generalized hyperhidrosis: Secondary | ICD-10-CM | POA: Diagnosis not present

## 2014-03-03 DIAGNOSIS — Z7951 Long term (current) use of inhaled steroids: Secondary | ICD-10-CM | POA: Insufficient documentation

## 2014-03-03 DIAGNOSIS — D649 Anemia, unspecified: Secondary | ICD-10-CM | POA: Insufficient documentation

## 2014-03-03 DIAGNOSIS — R0602 Shortness of breath: Secondary | ICD-10-CM | POA: Diagnosis not present

## 2014-03-03 DIAGNOSIS — Z720 Tobacco use: Secondary | ICD-10-CM | POA: Diagnosis not present

## 2014-03-03 DIAGNOSIS — Z8751 Personal history of pre-term labor: Secondary | ICD-10-CM | POA: Diagnosis not present

## 2014-03-03 DIAGNOSIS — F419 Anxiety disorder, unspecified: Secondary | ICD-10-CM | POA: Diagnosis not present

## 2014-03-03 LAB — COMPREHENSIVE METABOLIC PANEL
ALBUMIN: 3.1 g/dL — AB (ref 3.5–5.2)
ALT: 16 U/L (ref 0–35)
AST: 20 U/L (ref 0–37)
Alkaline Phosphatase: 103 U/L (ref 39–117)
Anion gap: 12 (ref 5–15)
BUN: 9 mg/dL (ref 6–23)
CO2: 24 meq/L (ref 19–32)
Calcium: 8.7 mg/dL (ref 8.4–10.5)
Chloride: 100 mEq/L (ref 96–112)
Creatinine, Ser: 0.58 mg/dL (ref 0.50–1.10)
GFR calc Af Amer: >90 mL/min (ref >90)
GFR calc non Af Amer: >90 mL/min (ref >90)
Glucose, Bld: 97 mg/dL (ref 70–99)
Potassium: 4.6 mEq/L (ref 3.7–5.3)
SODIUM: 136 meq/L — AB (ref 137–147)
Total Bilirubin: <0.2 mg/dL — ABNORMAL LOW (ref 0.3–1.2)
Total Protein: 7.3 g/dL (ref 6.0–8.3)

## 2014-03-03 LAB — URINE MICROSCOPIC-ADD ON

## 2014-03-03 LAB — URINALYSIS, ROUTINE W REFLEX MICROSCOPIC
Bilirubin Urine: NEGATIVE
GLUCOSE, UA: NEGATIVE mg/dL
Ketones, ur: NEGATIVE mg/dL
Nitrite: NEGATIVE
Protein, ur: NEGATIVE mg/dL
SPECIFIC GRAVITY, URINE: 1.003 — AB (ref 1.005–1.030)
Urobilinogen, UA: 0.2 mg/dL (ref 0.0–1.0)
pH: 6 (ref 5.0–8.0)

## 2014-03-03 LAB — I-STAT TROPONIN, ED
Troponin i, poc: 0 ng/mL (ref 0.00–0.08)
Troponin i, poc: 0 ng/mL (ref 0.00–0.08)

## 2014-03-03 LAB — CBC WITH DIFFERENTIAL/PLATELET
BASOS ABS: 0 10*3/uL (ref 0.0–0.1)
Basophils Relative: 1 % (ref 0–1)
EOS PCT: 2 % (ref 0–5)
Eosinophils Absolute: 0.1 10*3/uL (ref 0.0–0.7)
HCT: 27.1 % — ABNORMAL LOW (ref 36.0–46.0)
Hemoglobin: 9.1 g/dL — ABNORMAL LOW (ref 12.0–15.0)
LYMPHS PCT: 37 % (ref 12–46)
Lymphs Abs: 2.8 10*3/uL (ref 0.7–4.0)
MCH: 31.4 pg (ref 26.0–34.0)
MCHC: 33.6 g/dL (ref 30.0–36.0)
MCV: 93.4 fL (ref 78.0–100.0)
Monocytes Absolute: 0.5 10*3/uL (ref 0.1–1.0)
Monocytes Relative: 7 % (ref 3–12)
Neutro Abs: 4 10*3/uL (ref 1.7–7.7)
Neutrophils Relative %: 53 % (ref 43–77)
PLATELETS: 333 10*3/uL (ref 150–400)
RBC: 2.9 MIL/uL — ABNORMAL LOW (ref 3.87–5.11)
RDW: 17.1 % — AB (ref 11.5–15.5)
WBC: 7.5 10*3/uL (ref 4.0–10.5)

## 2014-03-03 LAB — PRO B NATRIURETIC PEPTIDE: Pro B Natriuretic peptide (BNP): 143.4 pg/mL — ABNORMAL HIGH (ref 0–125)

## 2014-03-03 LAB — LIPASE, BLOOD: Lipase: 21 U/L (ref 11–59)

## 2014-03-03 LAB — LACTIC ACID, PLASMA: Lactic Acid, Venous: 1.7 mmol/L (ref 0.5–2.2)

## 2014-03-03 LAB — PREGNANCY, URINE: PREG TEST UR: NEGATIVE

## 2014-03-03 MED ORDER — IOHEXOL 350 MG/ML SOLN
100.0000 mL | Freq: Once | INTRAVENOUS | Status: AC | PRN
Start: 2014-03-03 — End: 2014-03-03
  Administered 2014-03-03: 100 mL via INTRAVENOUS

## 2014-03-03 MED ORDER — SODIUM CHLORIDE 0.9 % IV BOLUS (SEPSIS)
1000.0000 mL | Freq: Once | INTRAVENOUS | Status: AC
  Administered 2014-03-03: 1000 mL via INTRAVENOUS

## 2014-03-03 MED ORDER — METOCLOPRAMIDE HCL 5 MG/ML IJ SOLN
10.0000 mg | Freq: Once | INTRAMUSCULAR | Status: AC
  Administered 2014-03-03: 10 mg via INTRAVENOUS
  Filled 2014-03-03: qty 2

## 2014-03-03 MED ORDER — NITROGLYCERIN 0.4 MG SL SUBL: 0.4000 mg | SUBLINGUAL_TABLET | SUBLINGUAL | Status: DC | PRN

## 2014-03-03 MED ORDER — IBUPROFEN 800 MG PO TABS: 800.0000 mg | ORAL_TABLET | Freq: Three times a day (TID) | ORAL | Status: DC

## 2014-03-03 NOTE — ED Notes (Signed)
Dr. Oni at bedside. 

## 2014-03-03 NOTE — ED Provider Notes (Signed)
CSN: [id number]     Arrival date & time 03/03/14  0224 History   First MD Initiated Contact with Patient 03/03/14 0235     Chief Complaint  Patient presents with  . Chest Pain     (Consider location/radiation/quality/duration/timing/severity/associated sxs/prior Treatment) HPI [name] [name] is a 43 y.o. female with past medical history of HIV and depression coming in with chest pain.  She describes it as left-sided pressure. There is no radiation. She has a history of pericarditis in 2006 and has not had symptoms since. She states this is associated with shortness of breath, emesis 1, diaphoresis and dizziness. She also had pain in her left arm yesterday that resolved on its own. This is in the setting of a viral URI last week which has since resolved. Her symptoms included Rhinorrhea, and coughing, no suggestive fevers. Patient states her chest pain right now is only mild. History of heart attacks or blood clots. Patient has no further complaints.  10 Systems reviewed and are negative for acute change except as noted in the HPI.     Past Medical History  Diagnosis Date  . Anxiety   . Preterm labor   . HIV (human immunodeficiency virus infection)   . Blood transfusion 1997    contracted HIV from transfusion  . Anemia   . Depression    Past Surgical History  Procedure Laterality Date  . Cesarean section  04/2000  . Cesarean section  08/18/2011    Procedure: CESAREAN SECTION;  Surgeon: Reva Boresanya S Pratt, MD;  Location: [facility];  Service: Gynecology;  Laterality: N/A;  Repeat.  Will need AZT 3 hours before   Family History  Problem Relation Age of Onset  . Other Neg Hx    History  Substance Use Topics  . Smoking status: Current Every Day Smoker  . Smokeless tobacco: Never Used  . Alcohol Use: No   OB History   Grav Para Term Preterm Abortions TAB SAB Ect Mult Living   6 4 3 1 2  2   4      Review of Systems    Allergies  Influenza vaccine live and Latex  Home Medications    Prior to Admission medications   Medication Sig Start Date End Date Taking? Authorizing Provider  Acetaminophen-Aspirin Buffered (EXCEDRIN BACK & BODY) 250-250 MG tablet Take 3 tablets by mouth every 4 (four) hours as needed for fever.   Yes Historical Provider, MD  albuterol (PROVENTIL HFA;VENTOLIN HFA) 108 (90 BASE) MCG/ACT inhaler Inhale 2 puffs into the lungs every 6 (six) hours as needed for wheezing or shortness of breath. 06/04/13  Yes Randall Hissornelius N Van Dam, MD  beclomethasone (QVAR) 40 MCG/ACT inhaler Inhale 2 puffs into the lungs 2 (two) times daily. 01/16/14  Yes Randall Hissornelius N Van Dam, MD  darunavir-cobicistat (PREZCOBIX) 800-150 MG per tablet Take 1 tablet by mouth daily. 01/16/14  Yes Randall Hissornelius N Van Dam, MD  emtricitabine-tenofovir (TRUVADA) 200-300 MG per tablet Take 1 tablet by mouth daily. 05/17/13  Yes Randall Hissornelius N Van Dam, MD  escitalopram (LEXAPRO) 10 MG tablet Take 1 tablet (10 mg total) by mouth daily. 01/16/14  Yes Randall Hissornelius N Van Dam, MD  FOLIC ACID PO Take 1 tablet by mouth daily.   Yes Historical Provider, MD  Spacer/Aero-Holding Chambers (AEROCHAMBER PLUS) inhaler Use as instructed 06/04/13   Randall Hissornelius N Van Dam, MD   BP 118/59  Pulse 60  Temp(Src) 98.4 F (36.9 C) (Oral)  Resp 18  Ht 5\' 3"  (1.6 m)  Wt 187 lb (84.823 kg)  BMI 33.13 kg/m2  SpO2 100%  LMP 03/02/2014 Physical Exam  Nursing note and vitals reviewed. Constitutional: She is oriented to person, place, and time. She appears well-developed and well-nourished. No distress.  HENT:  Head: Normocephalic and atraumatic.  Nose: Nose normal.  Mouth/Throat: Oropharynx is clear and moist. No oropharyngeal exudate.  Eyes: Conjunctivae and EOM are normal. Pupils are equal, round, and reactive to light. No scleral icterus.  Neck: Normal range of motion. Neck supple. No JVD present. No tracheal deviation present. No thyromegaly present.  Cardiovascular: Normal rate, regular rhythm and normal heart sounds.  Exam reveals no  gallop and no friction rub.   No murmur heard. Pulmonary/Chest: Effort normal and breath sounds normal. No respiratory distress. She has no wheezes. She exhibits tenderness.  Abdominal: Soft. Bowel sounds are normal. She exhibits no distension and no mass. There is no tenderness. There is no rebound and no guarding.  Musculoskeletal: Normal range of motion. She exhibits no edema and no tenderness.  Lymphadenopathy:    She has no cervical adenopathy.  Neurological: She is alert and oriented to person, place, and time. No cranial nerve deficit. She exhibits normal muscle tone.  Skin: Skin is warm and dry. No rash noted. She is not diaphoretic. No erythema. No pallor.    ED Course  Procedures (including critical care time) Labs Review Labs Reviewed  CBC WITH DIFFERENTIAL - Abnormal; Notable for the following:    RBC 2.90 (*)    Hemoglobin 9.1 (*)    HCT 27.1 (*)    RDW 17.1 (*)    All other components within normal limits  COMPREHENSIVE METABOLIC PANEL - Abnormal; Notable for the following:    Sodium 136 (*)    Albumin 3.1 (*)    Total Bilirubin <0.2 (*)    All other components within normal limits  URINALYSIS, ROUTINE W REFLEX MICROSCOPIC - Abnormal; Notable for the following:    Specific Gravity, Urine 1.003 (*)    Hgb urine dipstick LARGE (*)    Leukocytes, UA TRACE (*)    All other components within normal limits  PRO B NATRIURETIC PEPTIDE - Abnormal; Notable for the following:    Pro B Natriuretic peptide (BNP) 143.4 (*)    All other components within normal limits  URINE MICROSCOPIC-ADD ON - Abnormal; Notable for the following:    Bacteria, UA FEW (*)    All other components within normal limits  LIPASE, BLOOD  PREGNANCY, URINE  LACTIC ACID, PLASMA  I-STAT TROPOININ, ED  I-STAT CHEM 8, ED  I-STAT TROPOININ, ED    Imaging Review Dg Chest 2 View  03/03/2014   CLINICAL DATA:  Initial evaluation for acute sharp chest pain  EXAM: CHEST  2 VIEW  COMPARISON:  Prior  radiograph from 05/31/2013  FINDINGS: The cardiac and mediastinal silhouettes are stable in size and contour, and remain within normal limits.  The lungs are normally inflated. No airspace consolidation, pleural effusion, or pulmonary edema is identified. There is no pneumothorax.  No acute osseous abnormality identified.  IMPRESSION: No active cardiopulmonary disease.   Electronically Signed   By: Rise MuBenjamin  McClintock M.D.   On: 03/03/2014 03:22   Ct Angio Chest Pe W/cm &/or Wo Cm  03/03/2014   CLINICAL DATA:  Initial evaluation for acute left-sided chest pain into left arm.  EXAM: CT ANGIOGRAPHY CHEST WITH CONTRAST  TECHNIQUE: Multidetector CT imaging of the chest was performed using the standard protocol during bolus administration of intravenous  contrast. Multiplanar CT image reconstructions and MIPs were obtained to evaluate the vascular anatomy.  CONTRAST:  OMNIPAQUE IOHEXOL 350 MG/ML SOLN  COMPARISON:  Prior radiograph performed earlier on the same day.  FINDINGS: Thyroid gland within normal limits.  No pathologically enlarged mediastinal, hilar, or axillary lymph nodes identified.  Intrathoracic aorta is of normal caliber without acute abnormality. Great vessels within normal limits.  Heart size is normal.  No pericardial effusion.  Pulmonary arterial tree is well opacified. No filling defect to suggest acute pulmonary embolism. Re-formatted imaging confirms these findings.  The lungs are clear without focal infiltrate or pulmonary edema. No pneumothorax. No pleural effusion. No worrisome pulmonary nodule or mass.  Few scattered granulomas noted within the right upper lobe.  Visualized portions of the upper abdomen are unremarkable.  No acute osseous abnormality. No worrisome lytic or blastic osseous lesions.  IMPRESSION: 1. No CT evidence for acute pulmonary embolism. 2. No other acute cardiopulmonary abnormality identified.   Electronically Signed   By: Rise Mu M.D.   On: 03/03/2014  06:42     EKG Interpretation   Date/Time:  Sunday March 03 2014 02:32:25 EDT Ventricular Rate:  67 PR Interval:  160 QRS Duration: 93 QT Interval:  396 QTC Calculation: 418 R Axis:   66 Text Interpretation:  Sinus rhythm Baseline wander in lead(s) V5 Confirmed  by Erroll Luna [phone]) on 03/03/2014 7:14:07 AM      MDM   Final diagnoses:  Chest pain    Patient presents to emergency department for chest pain. She describes it as pressure, associated with diaphoresis shortness of breath and vomiting. First troponin is negative and chest x-ray is normal. CT angiogram will be obtained to evaluate for pulmonary embolism. If negative along with delta troponin patient will be safe for discharge and primary care follow-up.  Patient's vital signs remain within normal limits. She is currently pending troponin. Please see oncoming provider's note for ultimate disposition. I Anticipate discharge.    Tomasita Crumble, MD 03/03/14 [id number]

## 2014-03-03 NOTE — ED Notes (Signed)
Pt c/o sharp CP in her left chest w/ radiation to left arm.  Pain started approximately 1 hour ago.

## 2014-03-03 NOTE — ED Notes (Signed)
Patient transported to CT 

## 2014-03-03 NOTE — ED Notes (Signed)
Patient's husband is frequently walking to the nursing station to inquire about his wife's condition and the results of her various tests.  Patient is resting comfortably in bed with eyes closed.

## 2014-03-03 NOTE — Discharge Instructions (Signed)
Chest Pain (Nonspecific) [name] [name], you were seen for chest pain. Her CT scan was normal, your EKG was normal, your heart markers were negative. Follow-up with your primary care physician within 3 days for continued treatment. If any symptoms worsen come back to the emergency department. Thank you. It is often hard to give a diagnosis for the cause of chest pain. There is always a chance that your pain could be related to something serious, such as a heart attack or a blood clot in the lungs. You need to follow up with your doctor. HOME CARE  If antibiotic medicine was given, take it as directed by your doctor. Finish the medicine even if you start to feel better.  For the next few days, avoid activities that bring on chest pain. Continue physical activities as told by your doctor.  Do not use any tobacco products. This includes cigarettes, chewing tobacco, and e-cigarettes.  Avoid drinking alcohol.  Only take medicine as told by your doctor.  Follow your doctor's suggestions for more testing if your chest pain does not go away.  Keep all doctor visits you made. GET HELP IF:  Your chest pain does not go away, even after treatment.  You have a rash with blisters on your chest.  You have a fever. GET HELP RIGHT AWAY IF:   You have more pain or pain that spreads to your arm, neck, jaw, back, or belly (abdomen).  You have shortness of breath.  You cough more than usual or cough up blood.  You have very bad back or belly pain.  You feel sick to your stomach (nauseous) or throw up (vomit).  You have very bad weakness.  You pass out (faint).  You have chills. This is an emergency. Do not wait to see if the problems will go away. Call your local emergency services (911 in U.S.). Do not drive yourself to the hospital. MAKE SURE YOU:   Understand these instructions.  Will watch your condition.  Will get help right away if you are not doing well or get worse. Document  Released: 10/13/2007 Document Revised: 05/01/2013 Document Reviewed: 10/13/2007 [facility] Patient Information 2015 [place], [place]. This information is not intended to replace advice given to you by your health care provider. Make sure you discuss any questions you have with your health care provider.  Emergency Department Resource Guide 1) Find a Doctor and Pay Out of Pocket Although you won't have to find out who is covered by your insurance plan, it is a good idea to ask around and get recommendations. You will then need to call the office and see if the doctor you have chosen will accept you as a new patient and what types of options they offer for patients who are self-pay. Some doctors offer discounts or will set up payment plans for their patients who do not have insurance, but you will need to ask so you aren't surprised when you get to your appointment.  2) Contact Your Local Health Department Not all health departments have doctors that can see patients for sick visits, but many do, so it is worth a call to see if yours does. If you don't know where your local health department is, you can check in your phone book. The CDC also has a tool to help you locate your state's health department, and many state websites also have listings of all of their local health departments.  3) Find a Walk-in Clinic If your illness is not likely  to be very severe or complicated, you may want to try a walk in clinic. These are popping up all over the country in pharmacies, drugstores, and shopping centers. They're usually staffed by nurse practitioners or physician assistants that have been trained to treat common illnesses and complaints. They're usually fairly quick and inexpensive. However, if you have serious medical issues or chronic medical problems, these are probably not your best option.  No Primary Care Doctor: - Call Health Connect at  [phone] - they can help you locate a primary care doctor that   accepts your insurance, provides certain services, etc. - Physician Referral Service- [phone]  Chronic Pain Problems: Organization         Address  Phone   Notes  [facility]  [phone] Patients need to be referred by their primary care doctor.   Medication Assistance: Organization         Address  Phone   Notes  [facility] Medication [facility] [address] [place] [place], [place] [address] [phone] --Must be a resident of [facility] -- Must have NO insurance coverage whatsoever (no Medicaid/ Medicare, etc.) -- The pt. MUST have a primary care doctor that directs their care regularly and follows them in the community   Med[facility]ist  [phone]   [employer]  [phone]    Agencies that provide inexpensive medical care: Organization         Address  Phone   Notes  [facility]  [phone]   [facility]    [phone]   [facility] [address] [place], [place] [address] [phone]   [facility] [address] [place] [place] [phone]   Planned Parenthood    [phone]   [facility]    [phone]   [facility]  [address], [place] Phone:  [phone], Fax:  [phone] Hours of Operation:  9 am - 6 pm, M-F.  Also accepts Medicaid/Medicare and self-pay.  [facility]  [address], [place] Phone: [phone], Fax: [phone]. Hours of Operation:  8:30 am - 5:30 pm, M-F.  Also accepts Medicaid and self-pay.  [facility] [place] [address] [place] Phone: [phone]   [facility] [address] Natasha Bence [place], [place] [phone], Ext. [phone] Mondays & Thursdays: 7-9 AM.  First 15 patients are seen on a first come, first serve basis.    Medicaid-accepting [facility] Providers:  Organization          Address  Phone   Notes  [facility] [address],  [phone] Also accepts self-pay patients.  [facility] [address] Laurell Josephs [place], [place]  [phone]   [facility] [address], [place] [phone]   [facility] [address] [place] [phone]   Renaye Rakers [address], [place]   [phone] Only accepts [place] patients after they have their name applied to their card.   Self-Pay (no insurance) in [facility]:  Organization         Address  Phone   Notes  Sickle Cell Patients, [facility] Internal Medicine [address] [place], [place] [phone]   [facility]  Urgent Care [address], [place] [phone]) [phone]   [facility] [place]  [address], [place] [phone]) [phone]   [facility]/Dr. Osei-Bonsu  [address] [place] [address], [place] or [phone], [place] [phone]) [phone] Phone number for both [place] and [place] locations is the same.  [facility]gent Medical and [facility] [address], [place] [phone]) [phone]   [facility] [place] [address] [place] [address], [place] or [address] [phone]) [phone]   [facility] [address], [place] [phone]) [phone], phone; [phone](336) [phone], fax Sees patients 1st and 3rd Saturday of every month.  Must not qualify for public or private insurance (i.e. Medicaid, Medicare, [place] Health Choice, Veterans' Benefits)  Household income should be no more than 200% of the poverty level The clinic cannot treat you if you are pregnant or think you are pregnant  Sexually transmitted diseases are not treated at the clinic.    Dental Care: Organization         Address  Phone  Notes  [facility] [address],  [place] [phone]) [phone] Accepts children up to age 45 who are enrolled in [place]icaid or [place] Health Choice; pregnant women with a Medicaid card; and children who have applied for Medicaid or [place] Health Choice, but were declined, whose parents can pay a reduced fee at time of service.  [facility] [place]  [address], [place] [phone] Accepts children up to age 721 who are enrolled in [place] or [place] Health Choice; pregnant women with a Medicaid card; and children who have applied for Medicaid or  Health Choice, but were declined, whose parents can pay a reduced fee at time of service.  [facility]  [address], [place] [phone]) [phone] Patients are seen by appointment only. Walk-ins are not accepted. [facility] will see patients 45 years of age and older. Monday - Tuesday (8am-5pm) Most Wednesdays (8:30-5pm) $30 per visit, cash only  [facility]  [address], [facility] [phone] Patients are seen by appointment only. Walk-ins are not accepted. [facility] will see patients 45 years of age and older. One Wednesday Evening (Monthly: Volunteer Based).  $30 per visit, cash only  [employer] [facility]  [phone]) [phone] for adults; Children under age 624, call Graduate Pediatric Dentistry at [phone]. Children aged 394-14, please call [phone]) [phone] to request a pediatric application.  Dental services are provided in all areas of dental care including fillings, crowns and bridges, complete and partial dentures, implants, gum treatment, root canals, and extractions. Preventive care is also provided. Treatment is provided to both adults and children. Patients are selected via a lottery and there is often a waiting list.   [facility] [address], [place]  [phone]) [phone] [email or web address]   [facility] [address], [place], [place]  [phone], Ext. [phone] Second and Fourth Thursday of each month, opens at 6:30 AM; Clinic ends at 9 AM.  Patients are seen on a first-come first-served basis, and a limited number are seen during each clinic.   [facility]  [address], [place], [place] [phone]) [phone]   Eligibility Requirements You must have lived in [place], [place], or [place] counties for at least the last three months.  You cannot be eligible for state or federal sponsored National Cityhealthcare insurance, including CIGNAVeterans Administration, [place]icaid, or [employer]Medicare.   You generally cannot be eligible for healthcare insurance through your employer.    How to apply: Eligibility screenings are held every Tuesday and Wednesday afternoon from 1:00 pm until 4:00 pm. You do not need an appointment for the interview!  [facility] [address], [place], [place] [phone]   [facility]  [phone]   [facility]  [phone]   [facility] [place] [facility]  [phone]    Behavioral Health Resources in the Community: Intensive Outpatient Programs Organization         Address  Phone  Notes  [facility] [address], [place], [place] [phone]   [facility] [address], [place], [place] [phone]   [facility]: [facility] [address], [place], [place]  [phone]   [facility] [address],  [place], [place] [phone] or [phone]   Substance Abuse Resources Organization         Address  Phone  Notes  [facility] Services  [phone]   [facility]  [phone]   [facility]  [phone]   [facility]  [phone]   Residential & Outpatient Substance Abuse Program  [phone]   Psychological Services Organization         Address  Phone  Notes  [facility]  [phone]- [phone]   [facility]  [phone]- [phone]    [facility] [address], [place] [phone] or [phone]    Mobile Crisis Teams Organization         Address  Phone  Notes  [facility], Mobile Crisis Care Unit  [phone]   [facility]  [address] [place], [place] [phone]   Doristine LocksSharon DeEsch [address] [place] [phone]    Self-Help/Support Groups Organization         Address  Phone             Notes  [facility] [place] - variety of support groups  [phone] Call for more information  Narcotics Anonymous (NA), [facility] [address], [place]  2 meetings at this location   Statisticianesidential Treatment Programs Organization         Address  Phone  Notes  [facility] [address],    [place]  [phone]   [facility]  [address], [place] [phone], [place], [place] [phone]   [facility] [address], [place] [phone] Admissions: 8am-3pm M-F  [facility] [address]    [place], [place] [phone]   [facility] [address], [place], [place] [phone]   [facility] [address]  [place], [place] [phone]   [facility] - Intensive Outpatient [address], [place], [place] [phone]   [facility] (Addiction Recovery Care [facility]oc[facility] [address]  [place], [place] [phone] or [phone]   [facility] (RTS) [address] [place], [place] [phone] Accepts Medicaid  [facility] [address]  [place] [phone] Substance Abuse/Addiction Treatment   [facility] Resources Organization         Address  Phone  Notes  [facility]  [phone]) [phone]   Angie FavaJulie Brannon, PhD [address],  [address] [place], [place]   [phone]) [phone] or [phone]) [phone]   [facility]   [address] [place], [place] [phone]) [phone]   [facility] [address] [place] [address], [place], [place] [phone]) [phone] Insurance/Medicaid/sponsorship through [facility]  Faith and Families [address]                                    [place], [place] [phone]) [phone] Therapy/tele-psych/case  [facility] [address]   [place], [place] [phone]) [phone]    Dr. Lolly MustacheArfeen  [phone]) [phone]   [facility] [place]  [employer] [facility] 1) [address], [place] 2) [address], [place] 3)  [address], [place] [phone]) [phone]) [phone]  [phone]) [phone]   [facility] Child Abuse Hotline [phone](336) [phone] or [phone](336) [phone] (After Hours)

## 2014-03-03 NOTE — ED Notes (Signed)
EKG given to EDP Oni for review 

## 2014-03-03 NOTE — ED Notes (Signed)
Patient back from CT.

## 2014-03-03 NOTE — ED Provider Notes (Signed)
Repeat troponin checked and within normal limits. Patient discharged per the plan of the prior provider  Toy BakerAnthony T Johnnetta Holstine, MD 03/03/14 [phone]

## 2014-03-04 ENCOUNTER — Ambulatory Visit: Payer: Medicaid Other | Admitting: Infectious Disease

## 2014-03-08 ENCOUNTER — Encounter: Payer: Self-pay | Admitting: Infectious Disease

## 2014-03-08 ENCOUNTER — Ambulatory Visit (INDEPENDENT_AMBULATORY_CARE_PROVIDER_SITE_OTHER): Payer: Medicaid Other | Admitting: Infectious Disease

## 2014-03-08 ENCOUNTER — Other Ambulatory Visit (HOSPITAL_COMMUNITY)
Admission: RE | Admit: 2014-03-08 | Discharge: 2014-03-08 | Disposition: A | Discharge status: Home / Self Care | Payer: Medicaid Other | Source: Ambulatory Visit | Attending: Infectious Disease | Admitting: Infectious Disease

## 2014-03-08 VITALS — BP 100/71 | HR 71 | Temp 97.7°F | Wt 202.0 lb

## 2014-03-08 DIAGNOSIS — F32A Depression, unspecified: Secondary | ICD-10-CM | POA: Insufficient documentation

## 2014-03-08 DIAGNOSIS — B20 Human immunodeficiency virus [HIV] disease: Secondary | ICD-10-CM

## 2014-03-08 DIAGNOSIS — Z113 Encounter for screening for infections with a predominantly sexual mode of transmission: Secondary | ICD-10-CM | POA: Insufficient documentation

## 2014-03-08 DIAGNOSIS — T7491XS Unspecified adult maltreatment, confirmed, sequela: Secondary | ICD-10-CM

## 2014-03-08 DIAGNOSIS — F329 Major depressive disorder, single episode, unspecified: Secondary | ICD-10-CM

## 2014-03-08 DIAGNOSIS — Z21 Asymptomatic human immunodeficiency virus [HIV] infection status: Secondary | ICD-10-CM

## 2014-03-08 DIAGNOSIS — Z79899 Other long term (current) drug therapy: Secondary | ICD-10-CM

## 2014-03-08 LAB — T-HELPER CELL (CD4) - (RCID CLINIC ONLY)
CD4 % Helper T Cell: 31 % — ABNORMAL LOW (ref 33–55)
CD4 T Cell Abs: 1030 /uL (ref 400–2700)

## 2014-03-08 MED ORDER — RITONAVIR 100 MG PO TABS: 100.0000 mg | ORAL_TABLET | Freq: Every day | ORAL | Status: DC

## 2014-03-08 MED ORDER — DARUNAVIR ETHANOLATE 800 MG PO TABS: 800.0000 mg | ORAL_TABLET | Freq: Every day | ORAL | Status: DC
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
## 2014-03-09 LAB — CBC WITH DIFFERENTIAL/PLATELET
Basophils Absolute: 0 10*3/uL (ref 0.0–0.1)
Basophils Relative: 0 % (ref 0–1)
Eosinophils Absolute: 0.1 10*3/uL (ref 0.0–0.7)
Eosinophils Relative: 2 % (ref 0–5)
HEMATOCRIT: 37.7 % (ref 36.0–46.0)
HEMOGLOBIN: 12.6 g/dL (ref 12.0–15.0)
LYMPHS PCT: 40 % (ref 12–46)
Lymphs Abs: 3 10*3/uL (ref 0.7–4.0)
MCH: 30.8 pg (ref 26.0–34.0)
MCHC: 33.4 g/dL (ref 30.0–36.0)
MCV: 92.2 fL (ref 78.0–100.0)
MONO ABS: 0.3 10*3/uL (ref 0.1–1.0)
MONOS PCT: 4 % (ref 3–12)
NEUTROS ABS: 4 10*3/uL (ref 1.7–7.7)
Neutrophils Relative %: 54 % (ref 43–77)
Platelets: 299 10*3/uL (ref 150–400)
RBC: 4.09 MIL/uL (ref 3.87–5.11)
RDW: 16.8 % — ABNORMAL HIGH (ref 11.5–15.5)
WBC: 7.4 10*3/uL (ref 4.0–10.5)

## 2014-03-09 LAB — COMPLETE METABOLIC PANEL WITH GFR
ALT: 18 U/L (ref 0–35)
AST: 21 U/L (ref 0–37)
Albumin: 3.9 g/dL (ref 3.5–5.2)
Alkaline Phosphatase: 108 U/L (ref 39–117)
BUN: 10 mg/dL (ref 6–23)
CHLORIDE: 100 meq/L (ref 96–112)
CO2: 26 meq/L (ref 19–32)
CREATININE: 0.57 mg/dL (ref 0.50–1.10)
Calcium: 8.4 mg/dL (ref 8.4–10.5)
GFR, Est African American: >89 mL/min
Glucose, Bld: 78 mg/dL (ref 70–99)
POTASSIUM: 4 meq/L (ref 3.5–5.3)
Sodium: 139 mEq/L (ref 135–145)
Total Bilirubin: 0.3 mg/dL (ref 0.2–1.2)
Total Protein: 7 g/dL (ref 6.0–8.3)

## 2014-03-09 LAB — LIPID PANEL
Cholesterol: 186 mg/dL (ref 0–200)
HDL: 63 mg/dL (ref >39)
LDL CALC: 110 mg/dL — AB (ref 0–99)
Total CHOL/HDL Ratio: 3 Ratio
Triglycerides: 63 mg/dL (ref <150)
VLDL: 13 mg/dL (ref 0–40)

## 2014-03-09 LAB — RPR

## 2014-03-11 ENCOUNTER — Encounter: Payer: Self-pay | Admitting: Infectious Disease

## 2014-03-11 LAB — URINE CYTOLOGY ANCILLARY ONLY
Chlamydia: NEGATIVE
Neisseria Gonorrhea: NEGATIVE

## 2014-03-11 LAB — HIV-1 RNA QUANT-NO REFLEX-BLD
HIV 1 RNA Quant: <20 copies/mL (ref <20)
HIV-1 RNA Quant, Log: <1.3 {Log} (ref <1.30)

## 2014-06-06 ENCOUNTER — Other Ambulatory Visit: Payer: Self-pay | Admitting: Infectious Disease

## 2014-10-16 ENCOUNTER — Telehealth: Payer: Self-pay | Admitting: *Deleted

## 2014-10-16 DIAGNOSIS — B20 Human immunodeficiency virus [HIV] disease: Secondary | ICD-10-CM | POA: Insufficient documentation

## 2014-10-16 DIAGNOSIS — T7491XA Unspecified adult maltreatment, confirmed, initial encounter: Secondary | ICD-10-CM | POA: Insufficient documentation

## 2014-10-16 DIAGNOSIS — F331 Major depressive disorder, recurrent, moderate: Secondary | ICD-10-CM | POA: Insufficient documentation

## 2014-10-16 DIAGNOSIS — G4700 Insomnia, unspecified: Secondary | ICD-10-CM | POA: Insufficient documentation

## 2014-10-16 NOTE — Telephone Encounter (Signed)
Received signed records release form from [facility].  Patient is establishing care with a new Primary (Alison Ervin, PA).   Updated care team, placed release in [facility] folder. Faxed last office note and labs to [phone]. Howell, Michelle M, RN  

## 2014-11-15 ENCOUNTER — Other Ambulatory Visit: Payer: Self-pay | Admitting: Infectious Disease

## 2014-11-19 ENCOUNTER — Ambulatory Visit: Payer: Medicaid Other | Admitting: Infectious Disease

## 2014-11-20 ENCOUNTER — Other Ambulatory Visit (HOSPITAL_COMMUNITY)
Admission: RE | Admit: 2014-11-20 | Discharge: 2014-11-20 | Disposition: A | Discharge status: Home / Self Care | Payer: Medicaid Other | Source: Ambulatory Visit | Attending: Infectious Disease | Admitting: Infectious Disease

## 2014-11-20 ENCOUNTER — Other Ambulatory Visit: Payer: Medicaid Other

## 2014-11-20 DIAGNOSIS — B20 Human immunodeficiency virus [HIV] disease: Secondary | ICD-10-CM

## 2014-11-20 DIAGNOSIS — Z113 Encounter for screening for infections with a predominantly sexual mode of transmission: Secondary | ICD-10-CM | POA: Insufficient documentation

## 2014-11-20 LAB — COMPLETE METABOLIC PANEL WITH GFR
ALBUMIN: 3.3 g/dL — AB (ref 3.5–5.2)
ALT: 12 U/L (ref 0–35)
AST: 14 U/L (ref 0–37)
Alkaline Phosphatase: 90 U/L (ref 39–117)
BUN: 8 mg/dL (ref 6–23)
CHLORIDE: 105 meq/L (ref 96–112)
CO2: 23 mEq/L (ref 19–32)
Calcium: 8.5 mg/dL (ref 8.4–10.5)
Creat: 0.46 mg/dL — ABNORMAL LOW (ref 0.50–1.10)
GFR, Est African American: >89 mL/min
GFR, Est Non African American: >89 mL/min
GLUCOSE: 94 mg/dL (ref 70–99)
Potassium: 4.2 mEq/L (ref 3.5–5.3)
SODIUM: 137 meq/L (ref 135–145)
Total Bilirubin: 0.3 mg/dL (ref 0.2–1.2)
Total Protein: 6.4 g/dL (ref 6.0–8.3)

## 2014-11-20 LAB — CBC WITH DIFFERENTIAL/PLATELET
BASOS ABS: 0.1 10*3/uL (ref 0.0–0.1)
Basophils Relative: 1 % (ref 0–1)
EOS ABS: 0.2 10*3/uL (ref 0.0–0.7)
EOS PCT: 3 % (ref 0–5)
HCT: 35 % — ABNORMAL LOW (ref 36.0–46.0)
HEMOGLOBIN: 11.7 g/dL — AB (ref 12.0–15.0)
LYMPHS ABS: 2.7 10*3/uL (ref 0.7–4.0)
Lymphocytes Relative: 44 % (ref 12–46)
MCH: 29.7 pg (ref 26.0–34.0)
MCHC: 33.4 g/dL (ref 30.0–36.0)
MCV: 88.8 fL (ref 78.0–100.0)
MPV: 9.3 fL (ref 8.6–12.4)
Monocytes Absolute: 0.5 10*3/uL (ref 0.1–1.0)
Monocytes Relative: 8 % (ref 3–12)
NEUTROS ABS: 2.7 10*3/uL (ref 1.7–7.7)
Neutrophils Relative %: 44 % (ref 43–77)
Platelets: 273 10*3/uL (ref 150–400)
RBC: 3.94 MIL/uL (ref 3.87–5.11)
RDW: 17.2 % — AB (ref 11.5–15.5)
WBC: 6.2 10*3/uL (ref 4.0–10.5)

## 2014-11-20 LAB — RPR

## 2014-11-21 LAB — URINE CYTOLOGY ANCILLARY ONLY
Chlamydia: NEGATIVE
NEISSERIA GONORRHEA: NEGATIVE

## 2014-11-21 LAB — HIV-1 RNA QUANT-NO REFLEX-BLD: HIV-1 RNA Quant, Log: <1.3 {Log} (ref <1.30)

## 2014-11-21 LAB — T-HELPER CELL (CD4) - (RCID CLINIC ONLY)
CD4 T CELL ABS: 840 /uL (ref 400–2700)
CD4 T CELL HELPER: 30 % — AB (ref 33–55)

## 2015-01-30 ENCOUNTER — Other Ambulatory Visit: Payer: Self-pay | Admitting: Infectious Disease

## 2015-02-06 ENCOUNTER — Other Ambulatory Visit: Payer: Self-pay | Admitting: Infectious Disease

## 2015-03-27 DIAGNOSIS — Z3189 Encounter for other procreative management: Secondary | ICD-10-CM | POA: Insufficient documentation

## 2015-04-02 ENCOUNTER — Ambulatory Visit (INDEPENDENT_AMBULATORY_CARE_PROVIDER_SITE_OTHER): Payer: Medicaid Other | Admitting: Infectious Disease

## 2015-04-02 ENCOUNTER — Other Ambulatory Visit: Payer: Self-pay | Admitting: Infectious Disease

## 2015-04-02 ENCOUNTER — Encounter: Payer: Self-pay | Admitting: Infectious Disease

## 2015-04-02 VITALS — BP 99/69 | HR 70 | Temp 97.9°F | Ht 61.0 in | Wt 212.0 lb

## 2015-04-02 DIAGNOSIS — Z21 Asymptomatic human immunodeficiency virus [HIV] infection status: Secondary | ICD-10-CM

## 2015-04-02 DIAGNOSIS — Z3189 Encounter for other procreative management: Secondary | ICD-10-CM

## 2015-04-02 DIAGNOSIS — T7411XD Adult physical abuse, confirmed, subsequent encounter: Secondary | ICD-10-CM

## 2015-04-02 DIAGNOSIS — B20 Human immunodeficiency virus [HIV] disease: Secondary | ICD-10-CM

## 2015-04-02 DIAGNOSIS — N979 Female infertility, unspecified: Secondary | ICD-10-CM

## 2015-04-02 DIAGNOSIS — F17200 Nicotine dependence, unspecified, uncomplicated: Secondary | ICD-10-CM | POA: Insufficient documentation

## 2015-04-02 DIAGNOSIS — R6889 Other general symptoms and signs: Secondary | ICD-10-CM | POA: Diagnosis not present

## 2015-04-02 DIAGNOSIS — F32A Depression, unspecified: Secondary | ICD-10-CM

## 2015-04-02 DIAGNOSIS — J4520 Mild intermittent asthma, uncomplicated: Secondary | ICD-10-CM | POA: Diagnosis not present

## 2015-04-02 DIAGNOSIS — Z720 Tobacco use: Secondary | ICD-10-CM

## 2015-04-02 DIAGNOSIS — F329 Major depressive disorder, single episode, unspecified: Secondary | ICD-10-CM

## 2015-04-02 DIAGNOSIS — J45909 Unspecified asthma, uncomplicated: Secondary | ICD-10-CM

## 2015-04-02 HISTORY — DX: Nicotine dependence, unspecified, uncomplicated: F17.200

## 2015-04-02 HISTORY — DX: Unspecified asthma, uncomplicated: J45.909

## 2015-04-02 HISTORY — DX: Other general symptoms and signs: R68.89

## 2015-04-02 LAB — CBC WITH DIFFERENTIAL/PLATELET
Basophils Absolute: 0 10*3/uL (ref 0.0–0.1)
Basophils Relative: 1 % (ref 0–1)
Eosinophils Absolute: 0.2 10*3/uL (ref 0.0–0.7)
Eosinophils Relative: 5 % (ref 0–5)
HCT: 36.1 % (ref 36.0–46.0)
Hemoglobin: 11.7 g/dL — ABNORMAL LOW (ref 12.0–15.0)
LYMPHS ABS: 2.1 10*3/uL (ref 0.7–4.0)
Lymphocytes Relative: 44 % (ref 12–46)
MCH: 28.9 pg (ref 26.0–34.0)
MCHC: 32.4 g/dL (ref 30.0–36.0)
MCV: 89.1 fL (ref 78.0–100.0)
MPV: 9.8 fL (ref 8.6–12.4)
Monocytes Absolute: 0.7 10*3/uL (ref 0.1–1.0)
Monocytes Relative: 14 % — ABNORMAL HIGH (ref 3–12)
NEUTROS ABS: 1.7 10*3/uL (ref 1.7–7.7)
NEUTROS PCT: 36 % — AB (ref 43–77)
PLATELETS: 238 10*3/uL (ref 150–400)
RBC: 4.05 MIL/uL (ref 3.87–5.11)
RDW: 17.2 % — ABNORMAL HIGH (ref 11.5–15.5)
WBC: 4.7 10*3/uL (ref 4.0–10.5)

## 2015-04-02 LAB — COMPLETE METABOLIC PANEL WITH GFR
ALBUMIN: 3.1 g/dL — AB (ref 3.6–5.1)
ALK PHOS: 98 U/L (ref 33–115)
ALT: 14 U/L (ref 6–29)
AST: 17 U/L (ref 10–35)
BUN: 11 mg/dL (ref 7–25)
CO2: 25 mmol/L (ref 20–31)
Calcium: 8.2 mg/dL — ABNORMAL LOW (ref 8.6–10.2)
Chloride: 105 mmol/L (ref 98–110)
Creat: 0.44 mg/dL — ABNORMAL LOW (ref 0.50–1.10)
GFR, Est African American: >89 mL/min (ref >=60)
GLUCOSE: 87 mg/dL (ref 65–99)
POTASSIUM: 4.3 mmol/L (ref 3.5–5.3)
Sodium: 137 mmol/L (ref 135–146)
TOTAL PROTEIN: 6.3 g/dL (ref 6.1–8.1)
Total Bilirubin: 0.2 mg/dL (ref 0.2–1.2)

## 2015-04-02 MED ORDER — EMTRICITABINE-TENOFOVIR AF 200-25 MG PO TABS: 1.0000 | ORAL_TABLET | Freq: Every day | ORAL | Status: DC

## 2015-04-02 MED ORDER — OSELTAMIVIR PHOSPHATE 75 MG PO CAPS: 75.0000 mg | ORAL_CAPSULE | Freq: Two times a day (BID) | ORAL | Status: DC
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
## 2015-04-04 LAB — T-HELPER CELL (CD4) - (RCID CLINIC ONLY)
CD4 T CELL ABS: 760 /uL (ref 400–2700)
CD4 T CELL HELPER: 33 % (ref 33–55)

## 2015-04-06 LAB — HIV-1 RNA QUANT-NO REFLEX-BLD
HIV 1 RNA Quant: <20 copies/mL (ref <20)
HIV-1 RNA Quant, Log: <1.3 Log copies/mL (ref <1.30)

## 2015-04-17 ENCOUNTER — Ambulatory Visit: Payer: Medicaid Other

## 2015-06-17 ENCOUNTER — Telehealth: Payer: Self-pay | Admitting: *Deleted

## 2015-06-17 NOTE — Telephone Encounter (Signed)
This is highly likely just a coincidence. The Descovy is essentially the same medicine at Truvada and safer for her int he long run. I would ask her to continue on it. These symptoms IF due to descovy will go away and then she will be on a much safer drug for the long run

## 2015-06-17 NOTE — Telephone Encounter (Signed)
Patient called to report that since she started the new medication Descovy she has been having nausea, abdominal pain and dizziness. She has also noticed a burning sensation in her stomach. She has had this for about 2 weeks and she can not take it any longer. She wants to come for a visit. Advised her will let the doctor know what is going on and give her a call back if he needs to see her. He may just change her back to her old medication. Verified call back number and advised will get back asap.

## 2015-08-27 ENCOUNTER — Other Ambulatory Visit: Payer: Self-pay | Admitting: Family Medicine

## 2015-08-27 DIAGNOSIS — N644 Mastodynia: Secondary | ICD-10-CM

## 2015-09-02 ENCOUNTER — Other Ambulatory Visit: Payer: Medicaid Other

## 2015-09-04 ENCOUNTER — Ambulatory Visit
Admission: RE | Admit: 2015-09-04 | Discharge: 2015-09-04 | Disposition: A | Discharge status: Home / Self Care | Payer: Medicaid Other | Source: Ambulatory Visit | Attending: Family Medicine | Admitting: Family Medicine

## 2015-09-04 DIAGNOSIS — N644 Mastodynia: Secondary | ICD-10-CM

## 2015-09-25 ENCOUNTER — Other Ambulatory Visit: Payer: Self-pay | Admitting: Infectious Disease

## 2015-10-01 ENCOUNTER — Other Ambulatory Visit: Payer: Self-pay | Admitting: Infectious Disease

## 2015-10-01 DIAGNOSIS — B20 Human immunodeficiency virus [HIV] disease: Secondary | ICD-10-CM

## 2015-10-01 MED ORDER — EMTRICITABINE-TENOFOVIR AF 200-25 MG PO TABS: 1.0000 | ORAL_TABLET | Freq: Every day | ORAL | Status: DC

## 2015-10-27 ENCOUNTER — Other Ambulatory Visit: Payer: Medicaid Other

## 2015-10-27 ENCOUNTER — Other Ambulatory Visit (HOSPITAL_COMMUNITY)
Admission: RE | Admit: 2015-10-27 | Discharge: 2015-10-27 | Disposition: A | Discharge status: Home / Self Care | Payer: Medicaid Other | Source: Ambulatory Visit | Attending: Infectious Disease | Admitting: Infectious Disease

## 2015-10-27 DIAGNOSIS — Z113 Encounter for screening for infections with a predominantly sexual mode of transmission: Secondary | ICD-10-CM | POA: Diagnosis not present

## 2015-10-27 DIAGNOSIS — B20 Human immunodeficiency virus [HIV] disease: Secondary | ICD-10-CM

## 2015-10-27 LAB — COMPLETE METABOLIC PANEL WITH GFR
ALT: 8 U/L (ref 6–29)
AST: 11 U/L (ref 10–35)
Albumin: 3.3 g/dL — ABNORMAL LOW (ref 3.6–5.1)
Alkaline Phosphatase: 76 U/L (ref 33–115)
BILIRUBIN TOTAL: 0.3 mg/dL (ref 0.2–1.2)
BUN: 10 mg/dL (ref 7–25)
CHLORIDE: 106 mmol/L (ref 98–110)
CO2: 23 mmol/L (ref 20–31)
Calcium: 8.1 mg/dL — ABNORMAL LOW (ref 8.6–10.2)
Creat: 0.39 mg/dL — ABNORMAL LOW (ref 0.50–1.10)
Glucose, Bld: 93 mg/dL (ref 65–99)
POTASSIUM: 3.8 mmol/L (ref 3.5–5.3)
Sodium: 139 mmol/L (ref 135–146)
Total Protein: 6.4 g/dL (ref 6.1–8.1)

## 2015-10-27 LAB — CBC WITH DIFFERENTIAL/PLATELET
BASOS PCT: 0 %
Basophils Absolute: 0 cells/uL (ref 0–200)
EOS ABS: 140 {cells}/uL (ref 15–500)
Eosinophils Relative: 2 %
HCT: 32.3 % — ABNORMAL LOW (ref 35.0–45.0)
Hemoglobin: 10.6 g/dL — ABNORMAL LOW (ref 11.7–15.5)
LYMPHS ABS: 2590 {cells}/uL (ref 850–3900)
Lymphocytes Relative: 37 %
MCH: 28.7 pg (ref 27.0–33.0)
MCHC: 32.8 g/dL (ref 32.0–36.0)
MCV: 87.5 fL (ref 80.0–100.0)
MONO ABS: 490 {cells}/uL (ref 200–950)
MONOS PCT: 7 %
MPV: 9.4 fL (ref 7.5–12.5)
NEUTROS ABS: 3780 {cells}/uL (ref 1500–7800)
Neutrophils Relative %: 54 %
PLATELETS: 261 10*3/uL (ref 140–400)
RBC: 3.69 MIL/uL — ABNORMAL LOW (ref 3.80–5.10)
RDW: 17.1 % — ABNORMAL HIGH (ref 11.0–15.0)
WBC: 7 10*3/uL (ref 3.8–10.8)

## 2015-10-27 LAB — HEPATITIS C ANTIBODY: HCV AB: NEGATIVE

## 2015-10-28 LAB — RPR

## 2015-10-28 LAB — URINE CYTOLOGY ANCILLARY ONLY
CHLAMYDIA, DNA PROBE: NEGATIVE
NEISSERIA GONORRHEA: NEGATIVE

## 2015-10-28 LAB — T-HELPER CELL (CD4) - (RCID CLINIC ONLY)
CD4 T CELL HELPER: 34 % (ref 33–55)
CD4 T Cell Abs: 870 /uL (ref 400–2700)

## 2015-10-28 LAB — HIV-1 RNA QUANT-NO REFLEX-BLD: HIV-1 RNA Quant, Log: <1.3 Log copies/mL (ref <1.30)

## 2015-11-05 LAB — HLA B*5701: HLA-B*5701 w/rflx HLA-B High: NEGATIVE

## 2015-11-10 ENCOUNTER — Ambulatory Visit (INDEPENDENT_AMBULATORY_CARE_PROVIDER_SITE_OTHER): Payer: Medicaid Other | Admitting: Infectious Disease

## 2015-11-10 ENCOUNTER — Encounter: Payer: Self-pay | Admitting: Infectious Disease

## 2015-11-10 VITALS — BP 115/72 | HR 63 | Temp 98.0°F | Wt 225.5 lb

## 2015-11-10 DIAGNOSIS — F17200 Nicotine dependence, unspecified, uncomplicated: Secondary | ICD-10-CM

## 2015-11-10 DIAGNOSIS — F32A Depression, unspecified: Secondary | ICD-10-CM

## 2015-11-10 DIAGNOSIS — T7491XD Unspecified adult maltreatment, confirmed, subsequent encounter: Secondary | ICD-10-CM | POA: Diagnosis not present

## 2015-11-10 DIAGNOSIS — B20 Human immunodeficiency virus [HIV] disease: Secondary | ICD-10-CM

## 2015-11-10 DIAGNOSIS — Z720 Tobacco use: Secondary | ICD-10-CM | POA: Diagnosis not present

## 2015-11-10 DIAGNOSIS — F329 Major depressive disorder, single episode, unspecified: Secondary | ICD-10-CM

## 2015-11-10 DIAGNOSIS — Z21 Asymptomatic human immunodeficiency virus [HIV] infection status: Secondary | ICD-10-CM
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
## 2015-12-08 ENCOUNTER — Encounter: Payer: Self-pay | Admitting: Infectious Disease

## 2015-12-09 ENCOUNTER — Encounter: Payer: Self-pay | Admitting: Infectious Disease

## 2015-12-18 ENCOUNTER — Encounter: Payer: Self-pay | Admitting: Infectious Disease

## 2015-12-19 ENCOUNTER — Telehealth: Payer: Self-pay | Admitting: *Deleted

## 2015-12-19 ENCOUNTER — Inpatient Hospital Stay (HOSPITAL_COMMUNITY): Payer: Medicaid Other

## 2015-12-19 ENCOUNTER — Inpatient Hospital Stay (HOSPITAL_COMMUNITY)
Admission: AD | Admit: 2015-12-19 | Discharge: 2015-12-19 | Disposition: A | Discharge status: Home / Self Care | Payer: Medicaid Other | Source: Ambulatory Visit | Attending: Family Medicine | Admitting: Family Medicine

## 2015-12-19 ENCOUNTER — Encounter (HOSPITAL_COMMUNITY): Payer: Self-pay | Admitting: Advanced Practice Midwife

## 2015-12-19 DIAGNOSIS — O9989 Other specified diseases and conditions complicating pregnancy, childbirth and the puerperium: Secondary | ICD-10-CM | POA: Diagnosis not present

## 2015-12-19 DIAGNOSIS — F419 Anxiety disorder, unspecified: Secondary | ICD-10-CM | POA: Diagnosis not present

## 2015-12-19 DIAGNOSIS — O26899 Other specified pregnancy related conditions, unspecified trimester: Secondary | ICD-10-CM

## 2015-12-19 DIAGNOSIS — O9952 Diseases of the respiratory system complicating childbirth: Secondary | ICD-10-CM | POA: Insufficient documentation

## 2015-12-19 DIAGNOSIS — J45909 Unspecified asthma, uncomplicated: Secondary | ICD-10-CM | POA: Insufficient documentation

## 2015-12-19 DIAGNOSIS — R109 Unspecified abdominal pain: Secondary | ICD-10-CM | POA: Diagnosis not present

## 2015-12-19 DIAGNOSIS — Z3A01 Less than 8 weeks gestation of pregnancy: Secondary | ICD-10-CM | POA: Diagnosis not present

## 2015-12-19 DIAGNOSIS — O99334 Smoking (tobacco) complicating childbirth: Secondary | ICD-10-CM | POA: Insufficient documentation

## 2015-12-19 DIAGNOSIS — Z21 Asymptomatic human immunodeficiency virus [HIV] infection status: Secondary | ICD-10-CM | POA: Diagnosis not present

## 2015-12-19 DIAGNOSIS — F329 Major depressive disorder, single episode, unspecified: Secondary | ICD-10-CM | POA: Diagnosis not present

## 2015-12-19 DIAGNOSIS — O99344 Other mental disorders complicating childbirth: Secondary | ICD-10-CM | POA: Diagnosis not present

## 2015-12-19 DIAGNOSIS — O9872 Human immunodeficiency virus [HIV] disease complicating childbirth: Secondary | ICD-10-CM | POA: Insufficient documentation

## 2015-12-19 DIAGNOSIS — M549 Dorsalgia, unspecified: Secondary | ICD-10-CM | POA: Diagnosis present

## 2015-12-19 DIAGNOSIS — O26891 Other specified pregnancy related conditions, first trimester: Secondary | ICD-10-CM | POA: Insufficient documentation

## 2015-12-19 LAB — URINALYSIS, ROUTINE W REFLEX MICROSCOPIC
Bilirubin Urine: NEGATIVE
Glucose, UA: NEGATIVE mg/dL
Hgb urine dipstick: NEGATIVE
Ketones, ur: NEGATIVE mg/dL
LEUKOCYTES UA: NEGATIVE
Nitrite: NEGATIVE
PH: 6 (ref 5.0–8.0)
Protein, ur: NEGATIVE mg/dL

## 2015-12-19 LAB — WET PREP, GENITAL
CLUE CELLS WET PREP: NONE SEEN
SPERM: NONE SEEN
TRICH WET PREP: NONE SEEN
Yeast Wet Prep HPF POC: NONE SEEN

## 2015-12-19 NOTE — MAU Note (Signed)
Having pain and cramping
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
## 2015-12-19 NOTE — Telephone Encounter (Signed)
This is NOT the best timing for her at this part of her story  We will need to get her back in to see us for followup. Ihope she has housing soon??

## 2015-12-19 NOTE — Telephone Encounter (Signed)
Patient walked in, asking for advice. States she has been told by her primary care provider that she is pregnant, but that the "gestional sac looks different." she is following up at her OB. Patient asked for advice. She had labwork drawn today by her OB but has not yet seen a provider. She has been experiencing cramping and contractions. She states her OB office told her to go to the ED for evaluation, as [place] [facility].  [facility]. Last menstrual cycle started 11/10/15, but patient states she often still had a cycle during her last pregnancy. Andree CossHowell, Michelle M, RN
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
## 2015-12-19 NOTE — Telephone Encounter (Signed)
Thanks Michelle
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
## 2015-12-19 NOTE — Discharge Instructions (Signed)
First Trimester of Pregnancy The first trimester of pregnancy is from week 1 until the end of week 12 (months 1 through 3). A week after a sperm fertilizes an egg, the egg will implant on the wall of the uterus. This embryo will begin to develop into a baby. Genes from you and your partner are forming the baby. The female genes determine whether the baby is a boy or a girl. At 6-8 weeks, the eyes and face are formed, and the heartbeat can be seen on ultrasound. At the end of 12 weeks, all the baby's organs are formed.  Now that you are pregnant, you will want to do everything you can to have a healthy baby. Two of the most important things are to get good prenatal care and to follow your health care provider's instructions. Prenatal care is all the medical care you receive before the baby's birth. This care will help prevent, find, and treat any problems during the pregnancy and childbirth. BODY CHANGES Your body goes through many changes during pregnancy. The changes vary from woman to woman.   You may gain or lose a couple of pounds at first.  You may feel sick to your stomach (nauseous) and throw up (vomit). If the vomiting is uncontrollable, call your health care provider.  You may tire easily.  You may develop headaches that can be relieved by medicines approved by your health care provider.  You may urinate more often. Painful urination may mean you have a bladder infection.  You may develop heartburn as a result of your pregnancy.  You may develop constipation because certain hormones are causing the muscles that push waste through your intestines to slow down.  You may develop hemorrhoids or swollen, bulging veins (varicose veins).  Your breasts may begin to grow larger and become tender. Your nipples may stick out more, and the tissue that surrounds them (areola) may become darker.  Your gums may bleed and may be sensitive to brushing and flossing.  Dark spots or blotches (chloasma,  mask of pregnancy) may develop on your face. This will likely fade after the baby is born.  Your menstrual periods will stop.  You may have a loss of appetite.  You may develop cravings for certain kinds of food.  You may have changes in your emotions from day to day, such as being excited to be pregnant or being concerned that something may go wrong with the pregnancy and baby.  You may have more vivid and strange dreams.  You may have changes in your hair. These can include thickening of your hair, rapid growth, and changes in texture. Some women also have hair loss during or after pregnancy, or hair that feels dry or thin. Your hair will most likely return to normal after your baby is born. WHAT TO EXPECT AT YOUR PRENATAL VISITS During a routine prenatal visit:  You will be weighed to make sure you and the baby are growing normally.  Your blood pressure will be taken.  Your abdomen will be measured to track your baby's growth.  The fetal heartbeat will be listened to starting around week 10 or 12 of your pregnancy.  Test results from any previous visits will be discussed. Your health care provider may ask you:  How you are feeling.  If you are feeling the baby move.  If you have had any abnormal symptoms, such as leaking fluid, bleeding, severe headaches, or abdominal cramping.  If you are using any tobacco products,   including cigarettes, chewing tobacco, and electronic cigarettes.  If you have any questions. Other tests that may be performed during your first trimester include:  Blood tests to find your blood type and to check for the presence of any previous infections. They will also be used to check for low iron levels (anemia) and Rh antibodies. Later in the pregnancy, blood tests for diabetes will be done along with other tests if problems develop.  Urine tests to check for infections, diabetes, or protein in the urine.  An ultrasound to confirm the proper growth  and development of the baby.  An amniocentesis to check for possible genetic problems.  Fetal screens for spina bifida and Down syndrome.  You may need other tests to make sure you and the baby are doing well.  HIV (human immunodeficiency virus) testing. Routine prenatal testing includes screening for HIV, unless you choose not to have this test. HOME CARE INSTRUCTIONS  Medicines  Follow your health care provider's instructions regarding medicine use. Specific medicines may be either safe or unsafe to take during pregnancy.  Take your prenatal vitamins as directed.  If you develop constipation, try taking a stool softener if your health care provider approves. Diet  Eat regular, well-balanced meals. Choose a variety of foods, such as meat or vegetable-based protein, fish, milk and low-fat dairy products, vegetables, fruits, and whole grain breads and cereals. Your health care provider will help you determine the amount of weight gain that is right for you.  Avoid raw meat and uncooked cheese. These carry germs that can cause birth defects in the baby.  Eating four or five small meals rather than three large meals a day may help relieve nausea and vomiting. If you start to feel nauseous, eating a few soda crackers can be helpful. Drinking liquids between meals instead of during meals also seems to help nausea and vomiting.  If you develop constipation, eat more high-fiber foods, such as fresh vegetables or fruit and whole grains. Drink enough fluids to keep your urine clear or pale yellow. Activity and Exercise  Exercise only as directed by your health care provider. Exercising will help you:  Control your weight.  Stay in shape.  Be prepared for labor and delivery.  Experiencing pain or cramping in the lower abdomen or low back is a good sign that you should stop exercising. Check with your health care provider before continuing normal exercises.  Try to avoid standing for long  periods of time. Move your legs often if you must stand in one place for a long time.  Avoid heavy lifting.  Wear low-heeled shoes, and practice good posture.  You may continue to have sex unless your health care provider directs you otherwise. Relief of Pain or Discomfort  Wear a good support bra for breast tenderness.   Take warm sitz baths to soothe any pain or discomfort caused by hemorrhoids. Use hemorrhoid cream if your health care provider approves.   Rest with your legs elevated if you have leg cramps or low back pain.  If you develop varicose veins in your legs, wear support hose. Elevate your feet for 15 minutes, 3-4 times a day. Limit salt in your diet. Prenatal Care  Schedule your prenatal visits by the twelfth week of pregnancy. They are usually scheduled monthly at first, then more often in the last 2 months before delivery.  Write down your questions. Take them to your prenatal visits.  Keep all your prenatal visits as directed by your   health care provider. Safety  Wear your seat belt at all times when driving.  Make a list of emergency phone numbers, including numbers for family, friends, the hospital, and police and fire departments. General Tips  Ask your health care provider for a referral to a local prenatal education class. Begin classes no later than at the beginning of month 6 of your pregnancy.  Ask for help if you have counseling or nutritional needs during pregnancy. Your health care provider can offer advice or refer you to specialists for help with various needs.  Do not use hot tubs, steam rooms, or saunas.  Do not douche or use tampons or scented sanitary pads.  Do not cross your legs for long periods of time.  Avoid cat litter boxes and soil used by cats. These carry germs that can cause birth defects in the baby and possibly loss of the fetus by miscarriage or stillbirth.  Avoid all smoking, herbs, alcohol, and medicines not prescribed by  your health care provider. Chemicals in these affect the formation and growth of the baby.  Do not use any tobacco products, including cigarettes, chewing tobacco, and electronic cigarettes. If you need help quitting, ask your health care provider. You may receive counseling support and other resources to help you quit.  Schedule a dentist appointment. At home, brush your teeth with a soft toothbrush and be gentle when you floss. SEEK MEDICAL CARE IF:   You have dizziness.  You have mild pelvic cramps, pelvic pressure, or nagging pain in the abdominal area.  You have persistent nausea, vomiting, or diarrhea.  You have a bad smelling vaginal discharge.  You have pain with urination.  You notice increased swelling in your face, hands, legs, or ankles. SEEK IMMEDIATE MEDICAL CARE IF:   You have a fever.  You are leaking fluid from your vagina.  You have spotting or bleeding from your vagina.  You have severe abdominal cramping or pain.  You have rapid weight gain or loss.  You vomit blood or material that looks like coffee grounds.  You are exposed to German measles and have never had them.  You are exposed to fifth disease or chickenpox.  You develop a severe headache.  You have shortness of breath.  You have any kind of trauma, such as from a fall or a car accident.   This information is not intended to replace advice given to you by your health care provider. Make sure you discuss any questions you have with your health care provider.   Document Released: 04/20/2001 Document Revised: 05/17/2014 Document Reviewed: 03/06/2013 Elsevier Interactive Patient Education 2016 [employer] Inc[employer]  

## 2015-12-22 LAB — GC/CHLAMYDIA PROBE AMP (~~LOC~~) NOT AT ARMC
CHLAMYDIA, DNA PROBE: NEGATIVE
Neisseria Gonorrhea: NEGATIVE

## 2016-01-21 ENCOUNTER — Ambulatory Visit: Payer: Medicaid Other | Admitting: Infectious Disease

## 2016-02-05 DIAGNOSIS — Z8659 Personal history of other mental and behavioral disorders: Secondary | ICD-10-CM | POA: Insufficient documentation

## 2016-02-18 ENCOUNTER — Other Ambulatory Visit: Payer: Medicaid Other

## 2016-02-18 DIAGNOSIS — B20 Human immunodeficiency virus [HIV] disease: Secondary | ICD-10-CM

## 2016-02-18 LAB — COMPLETE METABOLIC PANEL WITH GFR
ALT: 8 U/L (ref 6–29)
AST: 12 U/L (ref 10–35)
Albumin: 2.9 g/dL — ABNORMAL LOW (ref 3.6–5.1)
Alkaline Phosphatase: 75 U/L (ref 33–115)
BUN: 7 mg/dL (ref 7–25)
CALCIUM: 8.3 mg/dL — AB (ref 8.6–10.2)
CHLORIDE: 105 mmol/L (ref 98–110)
CO2: 21 mmol/L (ref 20–31)
Creat: 0.47 mg/dL — ABNORMAL LOW (ref 0.50–1.10)
GFR, Est African American: >89 mL/min (ref >=60)
GFR, Est Non African American: >89 mL/min (ref >=60)
GLUCOSE: 75 mg/dL (ref 65–99)
Potassium: 3.9 mmol/L (ref 3.5–5.3)
Sodium: 136 mmol/L (ref 135–146)
Total Bilirubin: 0.2 mg/dL (ref 0.2–1.2)
Total Protein: 5.9 g/dL — ABNORMAL LOW (ref 6.1–8.1)

## 2016-02-18 LAB — CBC WITH DIFFERENTIAL/PLATELET
BASOS PCT: 0 %
Basophils Absolute: 0 cells/uL (ref 0–200)
Eosinophils Absolute: 65 cells/uL (ref 15–500)
Eosinophils Relative: 1 %
HEMATOCRIT: 32.7 % — AB (ref 35.0–45.0)
Hemoglobin: 10.6 g/dL — ABNORMAL LOW (ref 11.7–15.5)
LYMPHS PCT: 34 %
Lymphs Abs: 2210 cells/uL (ref 850–3900)
MCH: 28.7 pg (ref 27.0–33.0)
MCHC: 32.4 g/dL (ref 32.0–36.0)
MCV: 88.6 fL (ref 80.0–100.0)
MONO ABS: 455 {cells}/uL (ref 200–950)
MPV: 10 fL (ref 7.5–12.5)
Monocytes Relative: 7 %
Neutro Abs: 3770 cells/uL (ref 1500–7800)
Neutrophils Relative %: 58 %
PLATELETS: 228 10*3/uL (ref 140–400)
RBC: 3.69 MIL/uL — ABNORMAL LOW (ref 3.80–5.10)
RDW: 17.4 % — AB (ref 11.0–15.0)
WBC: 6.5 10*3/uL (ref 3.8–10.8)

## 2016-02-19 LAB — RPR

## 2016-02-19 LAB — T-HELPER CELL (CD4) - (RCID CLINIC ONLY)
CD4 T CELL ABS: 620 /uL (ref 400–2700)
CD4 T CELL HELPER: 29 % — AB (ref 33–55)

## 2016-02-20 LAB — HIV-1 RNA QUANT-NO REFLEX-BLD: HIV-1 RNA Quant, Log: <1.3 Log copies/mL (ref <1.30)

## 2016-03-03 ENCOUNTER — Ambulatory Visit (INDEPENDENT_AMBULATORY_CARE_PROVIDER_SITE_OTHER): Payer: Medicaid Other | Admitting: Infectious Disease

## 2016-03-03 ENCOUNTER — Encounter: Payer: Self-pay | Admitting: Infectious Disease

## 2016-03-03 VITALS — BP 107/74 | HR 63 | Temp 97.9°F | Wt 237.0 lb

## 2016-03-03 DIAGNOSIS — O0991 Supervision of high risk pregnancy, unspecified, first trimester: Secondary | ICD-10-CM | POA: Diagnosis not present

## 2016-03-03 DIAGNOSIS — B20 Human immunodeficiency virus [HIV] disease: Secondary | ICD-10-CM

## 2016-03-03 DIAGNOSIS — T7491XD Unspecified adult maltreatment, confirmed, subsequent encounter: Secondary | ICD-10-CM

## 2016-03-03 NOTE — Patient Instructions (Signed)
followup with Dr. Drue SecondSNIDER
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
## 2016-04-28 ENCOUNTER — Other Ambulatory Visit: Payer: Medicaid Other

## 2016-04-29 ENCOUNTER — Other Ambulatory Visit: Payer: Medicaid Other

## 2016-04-29 DIAGNOSIS — B20 Human immunodeficiency virus [HIV] disease: Secondary | ICD-10-CM

## 2016-04-29 LAB — COMPLETE METABOLIC PANEL WITH GFR
ALBUMIN: 2.9 g/dL — AB (ref 3.6–5.1)
ALK PHOS: 104 U/L (ref 33–115)
ALT: 10 U/L (ref 6–29)
AST: 12 U/L (ref 10–35)
BILIRUBIN TOTAL: 0.2 mg/dL (ref 0.2–1.2)
BUN: 10 mg/dL (ref 7–25)
CALCIUM: 8.4 mg/dL — AB (ref 8.6–10.2)
CHLORIDE: 103 mmol/L (ref 98–110)
CO2: 23 mmol/L (ref 20–31)
Creat: 0.47 mg/dL — ABNORMAL LOW (ref 0.50–1.10)
GFR, Est African American: >89 mL/min (ref >=60)
GFR, Est Non African American: >89 mL/min (ref >=60)
Glucose, Bld: 95 mg/dL (ref 65–99)
Potassium: 4.1 mmol/L (ref 3.5–5.3)
Sodium: 134 mmol/L — ABNORMAL LOW (ref 135–146)
Total Protein: 5.6 g/dL — ABNORMAL LOW (ref 6.1–8.1)

## 2016-04-29 LAB — CBC WITH DIFFERENTIAL/PLATELET
BASOS PCT: 0 %
Basophils Absolute: 0 cells/uL (ref 0–200)
Eosinophils Absolute: 96 cells/uL (ref 15–500)
Eosinophils Relative: 1 %
HEMATOCRIT: 32.5 % — AB (ref 35.0–45.0)
HEMOGLOBIN: 10.8 g/dL — AB (ref 11.7–15.5)
LYMPHS ABS: 2400 {cells}/uL (ref 850–3900)
Lymphocytes Relative: 25 %
MCH: 30.9 pg (ref 27.0–33.0)
MCHC: 33.2 g/dL (ref 32.0–36.0)
MCV: 92.9 fL (ref 80.0–100.0)
MONO ABS: 480 {cells}/uL (ref 200–950)
MPV: 9.3 fL (ref 7.5–12.5)
Monocytes Relative: 5 %
Neutro Abs: 6624 cells/uL (ref 1500–7800)
Neutrophils Relative %: 69 %
Platelets: 224 10*3/uL (ref 140–400)
RBC: 3.5 MIL/uL — AB (ref 3.80–5.10)
RDW: 18.4 % — AB (ref 11.0–15.0)
WBC: 9.6 10*3/uL (ref 3.8–10.8)

## 2016-04-30 LAB — RPR

## 2016-04-30 LAB — T-HELPER CELL (CD4) - (RCID CLINIC ONLY)
CD4 % Helper T Cell: 29 % — ABNORMAL LOW (ref 33–55)
CD4 T Cell Abs: 670 /uL (ref 400–2700)

## 2016-05-04 LAB — HIV-1 RNA QUANT-NO REFLEX-BLD
HIV 1 RNA Quant: <20 copies/mL (ref <20)
HIV-1 RNA Quant, Log: <1.3 Log copies/mL (ref <1.30)

## 2016-05-11 ENCOUNTER — Ambulatory Visit (INDEPENDENT_AMBULATORY_CARE_PROVIDER_SITE_OTHER): Payer: Medicaid Other | Admitting: Internal Medicine

## 2016-05-11 ENCOUNTER — Encounter: Payer: Self-pay | Admitting: Internal Medicine

## 2016-05-11 VITALS — BP 119/78 | HR 75 | Temp 97.7°F | Ht 62.0 in | Wt 254.5 lb

## 2016-05-11 DIAGNOSIS — B20 Human immunodeficiency virus [HIV] disease: Secondary | ICD-10-CM | POA: Diagnosis not present

## 2016-05-11 DIAGNOSIS — T7411XD Adult physical abuse, confirmed, subsequent encounter: Secondary | ICD-10-CM

## 2016-05-11 DIAGNOSIS — O0991 Supervision of high risk pregnancy, unspecified, first trimester: Secondary | ICD-10-CM | POA: Diagnosis present
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
## 2016-05-11 NOTE — Patient Instructions (Signed)
Please come do lab work 1 week prior to next visit

## 2016-05-12 ENCOUNTER — Ambulatory Visit: Payer: Medicaid Other | Admitting: Infectious Disease

## 2016-07-13 ENCOUNTER — Ambulatory Visit (INDEPENDENT_AMBULATORY_CARE_PROVIDER_SITE_OTHER): Payer: Medicaid Other | Admitting: Pharmacist

## 2016-07-13 ENCOUNTER — Ambulatory Visit: Payer: Medicaid Other | Admitting: Internal Medicine

## 2016-07-13 DIAGNOSIS — O0993 Supervision of high risk pregnancy, unspecified, third trimester: Secondary | ICD-10-CM

## 2016-07-13 DIAGNOSIS — B20 Human immunodeficiency virus [HIV] disease: Secondary | ICD-10-CM | POA: Diagnosis present

## 2016-07-13 LAB — COMPLETE METABOLIC PANEL WITH GFR
ALT: 15 U/L (ref 6–29)
AST: 15 U/L (ref 10–35)
Albumin: 2.9 g/dL — ABNORMAL LOW (ref 3.6–5.1)
Alkaline Phosphatase: 178 U/L — ABNORMAL HIGH (ref 33–115)
BUN: 9 mg/dL (ref 7–25)
CALCIUM: 9 mg/dL (ref 8.6–10.2)
CO2: 21 mmol/L (ref 20–31)
Chloride: 103 mmol/L (ref 98–110)
Creat: 0.53 mg/dL (ref 0.50–1.10)
GFR, Est African American: >89 mL/min (ref >=60)
GFR, Est Non African American: >89 mL/min (ref >=60)
Glucose, Bld: 79 mg/dL (ref 65–99)
POTASSIUM: 4.4 mmol/L (ref 3.5–5.3)
Sodium: 135 mmol/L (ref 135–146)
Total Bilirubin: 0.3 mg/dL (ref 0.2–1.2)
Total Protein: 6 g/dL — ABNORMAL LOW (ref 6.1–8.1)

## 2016-07-13 NOTE — Progress Notes (Signed)
HPI: [name] [name] is a 46 y.o. female who presents to the [facility] clinic today for a HIV follow-up with Dr. Drue Second.  She is 8 months pregnant.  She worked 3rd shift last night and is unable to stay around for her appointment at 10am.  I was asked to see her instead.   Allergies: Allergies  Allergen Reactions  . Nortriptyline Other (See Comments) and Nausea And Vomiting  . Influenza Vaccine Live Other (See Comments)    Claims to have had very bad congestion and headaches for 7 days post each of her last flu shots in 2011 and 2002 REFUSES  . Latex Rash    Past Medical History: Past Medical History:  Diagnosis Date  . Anemia   . Anxiety   . Asthma 04/02/2015  . 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 Depression   . Flu-like symptoms 04/02/2015  . HIV (human immunodeficiency virus infection) (HCC)   . Preterm labor   . Smoker 04/02/2015    Social History: Social History   Social History  . Marital status: Married    Spouse name: N/A  . Number of children: N/A  . Years of education: N/A   Social History Main Topics  . Smoking status: Former Smoker    Packs/day: 0.50    Types: Cigarettes  . Smokeless tobacco: Never Used  . Alcohol use No  . Drug use: No  . Sexual activity: Yes    Birth control/ protection: None   Other Topics Concern  . Not on file   Social History Narrative  . No narrative on file    Current Regimen: Prezista/norvir + Descovy  Labs: HIV 1 RNA Quant (copies/mL)  Date Value  04/29/2016 <20  02/18/2016 <20  10/27/2015 <20   CD4 T Cell Abs (/uL)  Date Value  04/29/2016 670  02/18/2016 620  10/27/2015 870   Hep B S Ab (no units)  Date Value  05/13/2011 POS (A)   Hepatitis B Surface Ag (no units)  Date Value  05/13/2011 NEGATIVE  02/17/2011 Negative   HCV Ab (no units)  Date Value  10/27/2015 NEGATIVE    CrCl: CrCl cannot be calculated (Patient's most recent lab result is older than the maximum 21 days  allowed.).  Lipids:    Component Value Date/Time   CHOL 186 03/08/2014 1006   TRIG 63 03/08/2014 1006   HDL 63 03/08/2014 1006   CHOLHDL 3.0 03/08/2014 1006   VLDL 13 03/08/2014 1006   LDLCALC 110 (H) 03/08/2014 1006    Assessment: [name] [name] is here today to follow-up for her HIV infection.  She is on Prezista/norvir + Descovy.  She is 8 months pregnant and is planning on having a c section on March 26th.  She tells me she has missed only 2 doses since seeing Dr. Drue Second the first of January. She isn't having any issues with tolerating her medications and likes the regimen she is on. She is asking for her records to be sent to the OB[facility]GYN at [facility]. She filled out a form to have her labs sent there. We will get labs today, and I made a f/u appointment with Dr. Drue Second for after she gives birth.  I again counseled her on the importance of taking her medications, especially in this last month.   Plans: - Continue Prezista/norvir + Descovy - HIV VL, CD4, BMET, and CBC w diff today - F/u with Dr. Drue Second 5/1 at 10:15am  Isaias Dowson L. Milika Ventress, PharmD, CPP  Infectious Diseases Clinical Pharmacist [facility] 07/13/2016, 9:26 AM

## 2016-07-14 LAB — T-HELPER CELL (CD4) - (RCID CLINIC ONLY)
CD4 % Helper T Cell: 26 % — ABNORMAL LOW (ref 33–55)
CD4 T CELL ABS: 620 /uL (ref 400–2700)

## 2016-07-15 LAB — HIV-1 RNA QUANT-NO REFLEX-BLD
HIV 1 RNA Quant: <20 copies/mL
HIV-1 RNA QUANT, LOG: NOT DETECTED {Log_copies}/mL

## 2016-08-12 ENCOUNTER — Emergency Department (HOSPITAL_BASED_OUTPATIENT_CLINIC_OR_DEPARTMENT_OTHER): Payer: Medicaid Other

## 2016-08-12 ENCOUNTER — Emergency Department (HOSPITAL_BASED_OUTPATIENT_CLINIC_OR_DEPARTMENT_OTHER)
Admission: EM | Admit: 2016-08-12 | Discharge: 2016-08-13 | Disposition: A | Discharge status: Home / Self Care | Payer: Medicaid Other | Attending: Emergency Medicine | Admitting: Emergency Medicine

## 2016-08-12 ENCOUNTER — Encounter (HOSPITAL_BASED_OUTPATIENT_CLINIC_OR_DEPARTMENT_OTHER): Payer: Self-pay | Admitting: *Deleted

## 2016-08-12 DIAGNOSIS — Z21 Asymptomatic human immunodeficiency virus [HIV] infection status: Secondary | ICD-10-CM | POA: Diagnosis not present

## 2016-08-12 DIAGNOSIS — J45909 Unspecified asthma, uncomplicated: Secondary | ICD-10-CM | POA: Insufficient documentation

## 2016-08-12 DIAGNOSIS — R102 Pelvic and perineal pain: Secondary | ICD-10-CM

## 2016-08-12 DIAGNOSIS — N3000 Acute cystitis without hematuria: Secondary | ICD-10-CM

## 2016-08-12 DIAGNOSIS — G8918 Other acute postprocedural pain: Secondary | ICD-10-CM | POA: Insufficient documentation

## 2016-08-12 DIAGNOSIS — Z87891 Personal history of nicotine dependence: Secondary | ICD-10-CM | POA: Diagnosis not present

## 2016-08-12 DIAGNOSIS — R1030 Lower abdominal pain, unspecified: Secondary | ICD-10-CM | POA: Diagnosis present

## 2016-08-12 DIAGNOSIS — Z98891 History of uterine scar from previous surgery: Secondary | ICD-10-CM

## 2016-08-12 DIAGNOSIS — Z79899 Other long term (current) drug therapy: Secondary | ICD-10-CM | POA: Diagnosis not present

## 2016-08-12 LAB — URINALYSIS, ROUTINE W REFLEX MICROSCOPIC
BILIRUBIN URINE: NEGATIVE
Glucose, UA: NEGATIVE mg/dL
HGB URINE DIPSTICK: NEGATIVE
Ketones, ur: NEGATIVE mg/dL
Nitrite: NEGATIVE
PROTEIN: NEGATIVE mg/dL
Specific Gravity, Urine: 1.01 (ref 1.005–1.030)
pH: 7 (ref 5.0–8.0)

## 2016-08-12 LAB — PREGNANCY, URINE: Preg Test, Ur: NEGATIVE

## 2016-08-12 LAB — URINALYSIS, MICROSCOPIC (REFLEX): RBC / HPF: NONE SEEN RBC/hpf (ref 0–5)

## 2016-08-12 MED ORDER — MORPHINE SULFATE (PF) 4 MG/ML IV SOLN: 4.0000 mg | Freq: Once | INTRAVENOUS | Status: DC

## 2016-08-12 MED ORDER — SODIUM CHLORIDE 0.9 % IV BOLUS (SEPSIS)
1000.0000 mL | Freq: Once | INTRAVENOUS | Status: AC
  Administered 2016-08-12: 1000 mL via INTRAVENOUS

## 2016-08-12 NOTE — ED Notes (Signed)
Family at bedside. 
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
## 2016-08-12 NOTE — ED Notes (Signed)
Patient drinking contrast  - no nausea noted at this time

## 2016-08-12 NOTE — ED Triage Notes (Addendum)
Hx of post op c section 08/02/16. Lower abdominal pain and legs swelling. Dysuria. She was seen at [facility] tonight and told she has a UTI but could not treat it. Back spasms. States she is fine as long as she takes Vicodin but pain returns. Education on surgery. As she is leaving triage she states she took her last Vicodin today.

## 2016-08-13 LAB — COMPREHENSIVE METABOLIC PANEL
ALT: 14 U/L (ref 14–54)
AST: 18 U/L (ref 15–41)
Albumin: 2.8 g/dL — ABNORMAL LOW (ref 3.5–5.0)
Alkaline Phosphatase: 125 U/L (ref 38–126)
Anion gap: 8 (ref 5–15)
BUN: 9 mg/dL (ref 6–20)
CHLORIDE: 104 mmol/L (ref 101–111)
CO2: 22 mmol/L (ref 22–32)
CREATININE: 0.76 mg/dL (ref 0.44–1.00)
Calcium: 8.3 mg/dL — ABNORMAL LOW (ref 8.9–10.3)
GFR calc Af Amer: >60 mL/min (ref >60)
GLUCOSE: 98 mg/dL (ref 65–99)
Potassium: 4.1 mmol/L (ref 3.5–5.1)
Sodium: 134 mmol/L — ABNORMAL LOW (ref 135–145)
Total Bilirubin: 0.2 mg/dL — ABNORMAL LOW (ref 0.3–1.2)
Total Protein: 6.3 g/dL — ABNORMAL LOW (ref 6.5–8.1)

## 2016-08-13 LAB — CBC WITH DIFFERENTIAL/PLATELET
BASOS PCT: 0 %
Basophils Absolute: 0 10*3/uL (ref 0.0–0.1)
EOS PCT: 2 %
Eosinophils Absolute: 0.2 10*3/uL (ref 0.0–0.7)
HEMATOCRIT: 27.4 % — AB (ref 36.0–46.0)
Hemoglobin: 8.9 g/dL — ABNORMAL LOW (ref 12.0–15.0)
LYMPHS ABS: 2.8 10*3/uL (ref 0.7–4.0)
Lymphocytes Relative: 32 %
MCH: 31.9 pg (ref 26.0–34.0)
MCHC: 32.5 g/dL (ref 30.0–36.0)
MCV: 98.2 fL (ref 78.0–100.0)
MONO ABS: 0.7 10*3/uL (ref 0.1–1.0)
Monocytes Relative: 8 %
NEUTROS ABS: 4.9 10*3/uL (ref 1.7–7.7)
Neutrophils Relative %: 58 %
PLATELETS: 303 10*3/uL (ref 150–400)
RBC: 2.79 MIL/uL — ABNORMAL LOW (ref 3.87–5.11)
RDW: 16.6 % — AB (ref 11.5–15.5)
WBC: 8.6 10*3/uL (ref 4.0–10.5)

## 2016-08-13 MED ORDER — IOPAMIDOL (ISOVUE-300) INJECTION 61%
100.0000 mL | Freq: Once | INTRAVENOUS | Status: AC | PRN
  Administered 2016-08-13: 100 mL via INTRAVENOUS

## 2016-08-13 MED ORDER — CEPHALEXIN 500 MG PO CAPS: 500.0000 mg | ORAL_CAPSULE | Freq: Four times a day (QID) | ORAL | 0 refills | Status: DC

## 2016-08-13 MED ORDER — HYDROCODONE-ACETAMINOPHEN 5-325 MG PO TABS: 1.0000 | ORAL_TABLET | Freq: Four times a day (QID) | ORAL | 0 refills | Status: DC | PRN

## 2016-08-13 NOTE — ED Notes (Signed)
Patient reports that she was sent here post Urgent Care visit for Further evaluation. The patient reports that she has no pain at this time.

## 2016-08-13 NOTE — Discharge Instructions (Signed)
Keflex as prescribed.  Hydrocodone as prescribed as needed for pain.  Please follow-up with your obstetrician if you're not improving in the next 2-3 days, sooner if your symptoms worsen or change.

## 2016-08-13 NOTE — ED Notes (Signed)
Patient transported to CT and back.

## 2016-09-07 ENCOUNTER — Encounter: Payer: Self-pay | Admitting: Internal Medicine

## 2016-09-07 ENCOUNTER — Ambulatory Visit (INDEPENDENT_AMBULATORY_CARE_PROVIDER_SITE_OTHER): Payer: Medicaid Other | Admitting: Internal Medicine

## 2016-09-07 VITALS — BP 117/77 | HR 72 | Temp 97.7°F | Ht 61.0 in | Wt 229.0 lb

## 2016-09-07 DIAGNOSIS — B20 Human immunodeficiency virus [HIV] disease: Secondary | ICD-10-CM | POA: Diagnosis not present

## 2016-09-07 DIAGNOSIS — Z79899 Other long term (current) drug therapy: Secondary | ICD-10-CM

## 2016-09-07 MED ORDER — DOLUTEGRAVIR SODIUM 50 MG PO TABS: 50.0000 mg | ORAL_TABLET | Freq: Every day | ORAL | 11 refills | Status: DC

## 2016-09-07 MED ORDER — EMTRICITABINE-TENOFOVIR AF 200-25 MG PO TABS: 1.0000 | ORAL_TABLET | Freq: Every day | ORAL | 11 refills | Status: DC
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
## 2016-11-09 ENCOUNTER — Telehealth: Payer: Self-pay | Admitting: Advanced Practice Midwife

## 2016-11-09 ENCOUNTER — Encounter: Payer: Self-pay | Admitting: Internal Medicine

## 2016-11-09 ENCOUNTER — Ambulatory Visit (INDEPENDENT_AMBULATORY_CARE_PROVIDER_SITE_OTHER): Payer: Medicaid Other | Admitting: Internal Medicine

## 2016-11-09 ENCOUNTER — Inpatient Hospital Stay (HOSPITAL_COMMUNITY)
Admission: AD | Admit: 2016-11-09 | Discharge: 2016-11-09 | Disposition: A | Discharge status: Home / Self Care | Payer: Medicaid Other | Source: Ambulatory Visit | Attending: Family Medicine | Admitting: Family Medicine

## 2016-11-09 ENCOUNTER — Encounter (HOSPITAL_COMMUNITY): Payer: Self-pay | Admitting: *Deleted

## 2016-11-09 VITALS — BP 105/72 | HR 64 | Temp 98.1°F | Ht 62.0 in | Wt 230.0 lb

## 2016-11-09 DIAGNOSIS — B20 Human immunodeficiency virus [HIV] disease: Secondary | ICD-10-CM | POA: Diagnosis present

## 2016-11-09 DIAGNOSIS — J45909 Unspecified asthma, uncomplicated: Secondary | ICD-10-CM | POA: Diagnosis not present

## 2016-11-09 DIAGNOSIS — N939 Abnormal uterine and vaginal bleeding, unspecified: Secondary | ICD-10-CM | POA: Insufficient documentation

## 2016-11-09 DIAGNOSIS — Z87891 Personal history of nicotine dependence: Secondary | ICD-10-CM | POA: Insufficient documentation

## 2016-11-09 DIAGNOSIS — R5383 Other fatigue: Secondary | ICD-10-CM

## 2016-11-09 LAB — HCG, QUANTITATIVE, PREGNANCY

## 2016-11-09 LAB — CBC
HEMATOCRIT: 31.9 % — AB (ref 36.0–46.0)
HEMOGLOBIN: 10.1 g/dL — AB (ref 12.0–15.0)
MCH: 28.5 pg (ref 26.0–34.0)
MCHC: 31.7 g/dL (ref 30.0–36.0)
MCV: 90.1 fL (ref 78.0–100.0)
Platelets: 220 10*3/uL (ref 150–400)
RBC: 3.54 MIL/uL — AB (ref 3.87–5.11)
RDW: 17.4 % — ABNORMAL HIGH (ref 11.5–15.5)
WBC: 5.3 10*3/uL (ref 4.0–10.5)

## 2016-11-09 LAB — TYPE AND SCREEN
ABO/RH(D): O POS
Antibody Screen: NEGATIVE

## 2016-11-09 MED ORDER — MEGESTROL ACETATE 40 MG PO TABS: 40.0000 mg | ORAL_TABLET | Freq: Two times a day (BID) | ORAL | 5 refills | Status: DC

## 2016-11-09 NOTE — MAU Note (Signed)
Had a baby 3/26. (at [facility]).  Had nexplanon placed in May, started having bleeding; was removed placed on oral contraceptives.  Has slowed bleeding, but is still heavy.  Changing pads every 30min.  Feels weak and dizzy.  (had to leave triage, thought she was bleeding through pad

## 2016-11-09 NOTE — MAU Provider Note (Signed)
Chief Complaint: Vaginal Bleeding   First Provider Initiated Contact with Patient 11/09/16 1327     SUBJECTIVE HPI: [name] [name] is a 46 y.o. [id number] non-pregnant female who presents to Maternity Admissions reporting mod-heavy VB since May 8th, the day she had a Nexplanon implanted. Had it removed two weeks later. Rx'd OCP. Took x 2 weeks. Bleeding temporarily improved, then got heavy again. States her Ob told her to go to ED for this episode of heavy bleeding and F/U w/ Gyn for management.   Duration: 2 months Context: 4 months PP C/S Modifying factors: See above Associated signs and symptoms: Pos for fatigue. Neg for fever, chills, abd pain, dizziness.   Pt denies previous episodes of AUB, but upon review of Epic she has sought care for it in the past.   Past Medical History:  Diagnosis Date  . Anemia   . Anxiety   . Asthma 04/02/2015  . 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 Depression   . Flu-like symptoms 04/02/2015  . HIV (human immunodeficiency virus infection) (HCC)   . Preterm labor   . Smoker 04/02/2015   OB History  Gravida Para Term Preterm AB Living  7 5 3 1 2 5   SAB TAB Ectopic Multiple Live Births  2       2    # Outcome Date GA Lbr Len/2nd Weight Sex Delivery Anes PTL Lv  7 Term 08/18/11 [redacted]w[redacted]d   F CS-LTranv Spinal  LIV     Birth Comments: no dysmorphic features  6 Preterm 04/2000 101w0d  5 lb (2.268 kg)  CS-Unspec EPI  LIV     Birth Comments: felll one week before delivery, FHR dropped  5 SAB 08/1995          4 Term 01/1995 [redacted]w[redacted]d  8 lb (3.629 kg)  Vag-Spont None N   3 Term 07/1993 [redacted]w[redacted]d  9 lb (4.082 kg)  Vag-Spont None       Birth Comments: no complications  2 SAB 05/1992          1 Para              Past Surgical History:  Procedure Laterality Date  . CESAREAN SECTION  04/2000  . CESAREAN SECTION  08/18/2011   Procedure: CESAREAN SECTION;  Surgeon: Reva Bores, MD;  Location: [facility];  Service: Gynecology;  Laterality: N/A;   Repeat.  Will need AZT 3 hours before   Social History   Social History  . Marital status: Married    Spouse name: N/A  . Number of children: N/A  . Years of education: N/A   Occupational History  . Not on file.   Social History Main Topics  . Smoking status: Former Smoker    Packs/day: 0.50    Types: Cigarettes  . Smokeless tobacco: Never Used  . Alcohol use No  . Drug use: No  . Sexual activity: Yes    Birth control/ protection: None   Other Topics Concern  . Not on file   Social History Narrative  . No narrative on file   Family History  Problem Relation Age of Onset  . Other Neg Hx    No current facility-administered medications on file prior to encounter.    Current Outpatient Prescriptions on File Prior to Encounter  Medication Sig Dispense Refill  . albuterol (PROVENTIL HFA;VENTOLIN HFA) 108 (90 BASE) MCG/ACT inhaler Inhale 2 puffs into the lungs every 6 (six) hours as needed for wheezing  or shortness of breath. 1 Inhaler 6  . dolutegravir (TIVICAY) 50 MG tablet Take 1 tablet (50 mg total) by mouth daily. 30 tablet 11  . emtricitabine-tenofovir AF (DESCOVY) 200-25 MG tablet Take 1 tablet by mouth daily. 30 tablet 11  . FOLIC ACID PO Take 1 tablet by mouth daily.    . Prenatal Vit-Fe Fumarate-FA (PRENATAL MULTIVITAMIN) TABS tablet Take 1 tablet by mouth daily at 12 noon.    Marland Kitchen Spacer/Aero-Holding Chambers (AEROCHAMBER PLUS) inhaler Use as instructed 1 each 2   Allergies  Allergen Reactions  . Nortriptyline Other (See Comments) and Nausea And Vomiting  . Influenza Vaccine Live Other (See Comments)    Claims to have had very bad congestion and headaches for 7 days post each of her last flu shots in 2011 and 2002 REFUSES  . Latex Rash    I have reviewed patient's Past Medical Hx, Surgical Hx, Family Hx, Social Hx, medications and allergies.   Review of Systems  Constitutional: Positive for fatigue. Negative for chills and fever.  Gastrointestinal: Negative  for abdominal distention and abdominal pain.  Genitourinary: Positive for vaginal bleeding. Negative for vaginal discharge.  Musculoskeletal: Negative for back pain.  Skin: Negative for pallor.  Neurological: Negative for dizziness and syncope.  Hematological: Does not bruise/bleed easily.    OBJECTIVE Patient Vitals for the past 24 hrs:  BP Pulse Resp  11/09/16 1438 121/75 61 -  11/09/16 1250 - - 17   Orthostatic VS Nml  Constitutional: Well-developed, well-nourished, obese female in no acute distress.  Skin No pallor Cardiovascular: normal rate Respiratory: normal rate and effort.  GI: Abd soft, non-tender MS: Extremities nontender, no edema, normal ROM Neurologic: Alert and oriented x 4.  GU: Neg CVAT. S/P Circumcision, physiologic discharge, moderate amount of dark red blood noted, cervix clean, but incompletely visualized. No polyp seen.   BIMANUAL: cervix closed; uterus no obviously enlarged, but exam limited by body habitus, no adnexal tenderness or masses. No CMT.  LAB RESULTS Results for orders placed or performed during the hospital encounter of 11/09/16 (from the past 24 hour(s))  CBC     Status: Abnormal   Collection Time: 11/09/16  1:12 PM  Result Value Ref Range   WBC 5.3 4.0 - 10.5 K/uL   RBC 3.54 (L) 3.87 - 5.11 MIL/uL   Hemoglobin 10.1 (L) 12.0 - 15.0 g/dL   HCT 16.1 (L) 09.6 - 04.5 %   MCV 90.1 78.0 - 100.0 fL   MCH 28.5 26.0 - 34.0 pg   MCHC 31.7 30.0 - 36.0 g/dL   RDW 40.9 (H) 81.1 - 91.4 %   Platelets 220 150 - 400 K/uL  Type and screen [facility]     Status: None   Collection Time: 11/09/16  1:12 PM  Result Value Ref Range   ABO/RH(D) O POS    Antibody Screen NEG    Sample Expiration 11/12/2016     IMAGING No results found.  MAU COURSE Orders Placed This Encounter  Procedures  . US Pelvis Complete  . US Transvaginal Non-OB  . CBC  . hCG, quantitative, pregnancy  . Type and screen [facility]  .  Discharge patient   Meds ordered this encounter  Medications  . IRON PO    Sig: Take 1 tablet by mouth every morning.  . megestrol (MEGACE) 40 MG tablet    Sig: Take 1 tablet (40 mg total) by mouth 2 (two) times daily. Take two tablets (40 mg)  three times per day time three days,  then take two tablets (40 mg) two times per day time three days,  then take two tablets (40 mg)once per day    Dispense:  60 tablet    Refill:  5    Order Specific Question:   Supervising Provider    Answer:   Levie HeritageSTINSON, JACOB J [[id number]]    MDM - AUB, possibly perimenopausal. Hemodynamically stable   ASSESSMENT 1. Abnormal uterine bleeding     PLAN Discharge home in stable condition. Bleeding precautions Rx Megace  Continue Iron Follow-up Information    [facility] Follow up in 3 week(s).   Specialty:  Obstetrics and Gynecology Why:  Management of abnormal uterine bleeding and routine Gynecology care Contact information: [address] [place]  [phone]       [facility] ADMISSIONS Follow up.   Why:  Gynecology emergencies Contact information: [address] [id number] mc [place] [id number] [phone]         Allergies as of 11/09/2016      Reactions   Nortriptyline Other (See Comments), Nausea And Vomiting   Influenza Vaccine Live Other (See Comments)   Claims to have had very bad congestion and headaches for 7 days post each of her last flu shots in 2011 and 2002 REFUSES   Latex Rash      Medication List    TAKE these medications   AEROCHAMBER PLUS inhaler Use as instructed   albuterol 108 (90 Base) MCG/ACT inhaler Commonly known as:  PROVENTIL HFA;VENTOLIN HFA Inhale 2 puffs into the lungs every 6 (six) hours as needed for wheezing or shortness of breath.   dolutegravir 50 MG tablet Commonly known as:  TIVICAY Take 1 tablet (50 mg  total) by mouth daily.   emtricitabine-tenofovir AF 200-25 MG tablet Commonly known as:  DESCOVY Take 1 tablet by mouth daily.   FOLIC ACID PO Take 1 tablet by mouth daily.   IRON PO Take 1 tablet by mouth every morning.   megestrol 40 MG tablet Commonly known as:  MEGACE Take 1 tablet (40 mg total) by mouth 2 (two) times daily. Take two tablets (40 mg) three times per day time three days,  then take two tablets (40 mg) two times per day time three days,  then take two tablets (40 mg)once per day   prenatal multivitamin Tabs tablet Take 1 tablet by mouth daily at 12 noon.        Katrinka BlazingSmith, [place], CNM 11/09/2016  3:10 PM

## 2016-11-09 NOTE — MAU Note (Signed)
Pt reports vaginal bleeding since 09/14/2016, states she had implantable birth control on 05/08 and they removed it 2 weeks later due to heavy bleeding , took po birth control for 10 days and it slowed some but returned really heavy after that. States she feels dizzy and has a headache.

## 2016-11-09 NOTE — Telephone Encounter (Signed)
Pt sleeping. Husband answered. Pt had previously given permission for husband to receive results. Quant <1. Indicated that bleeding is not from POC's from delivery.
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
## 2016-11-09 NOTE — Discharge Instructions (Signed)
Abnormal Uterine Bleeding Abnormal uterine bleeding can affect women at various stages in life, including teenagers, women in their reproductive years, pregnant women, and women who have reached menopause. Several kinds of uterine bleeding are considered abnormal, including:  Bleeding or spotting between periods.  Bleeding after sexual intercourse.  Bleeding that is heavier or more than normal.  Periods that last longer than usual.  Bleeding after menopause. Many cases of abnormal uterine bleeding are minor and simple to treat, while others are more serious. Any type of abnormal bleeding should be evaluated by your health care provider. Treatment will depend on the cause of the bleeding. Follow these instructions at home: Monitor your condition for any changes. The following actions may help to alleviate any discomfort you are experiencing:  Avoid the use of tampons and douches as directed by your health care provider.  Change your pads frequently. You should get regular pelvic exams and Pap tests. Keep all follow-up appointments for diagnostic tests as directed by your health care provider. Contact a health care provider if:  Your bleeding lasts more than 1 week.  You feel dizzy at times. Get help right away if:  You pass out.  You are changing pads every 15 to 30 minutes.  You have abdominal pain.  You have a fever.  You become sweaty or weak.  You are passing large blood clots from the vagina.  You start to feel nauseous and vomit. This information is not intended to replace advice given to you by your health care provider. Make sure you discuss any questions you have with your health care provider. Document Released: 04/26/2005 Document Revised: 10/08/2015 Document Reviewed: 11/23/2012 Elsevier Interactive Patient Education  2017 Elsevier Inc.  

## 2016-12-01 ENCOUNTER — Encounter: Payer: Medicaid Other | Admitting: Obstetrics & Gynecology

## 2016-12-02 ENCOUNTER — Ambulatory Visit (HOSPITAL_COMMUNITY): Payer: Medicaid Other

## 2016-12-07 ENCOUNTER — Ambulatory Visit (HOSPITAL_COMMUNITY)
Admission: RE | Admit: 2016-12-07 | Discharge: 2016-12-07 | Disposition: A | Discharge status: Home / Self Care | Payer: Medicaid Other | Source: Ambulatory Visit | Attending: Advanced Practice Midwife | Admitting: Advanced Practice Midwife

## 2016-12-07 DIAGNOSIS — N939 Abnormal uterine and vaginal bleeding, unspecified: Secondary | ICD-10-CM | POA: Insufficient documentation

## 2016-12-09 ENCOUNTER — Telehealth: Payer: Self-pay

## 2016-12-09 NOTE — Telephone Encounter (Signed)
Left message for patient to return call to office to schedule follow up appointment. Armandina StammerJennifer Arrin Ishler RN BSN

## 2016-12-09 NOTE — Telephone Encounter (Signed)
-----   Message from [place], [place] sent at 12/09/2016 10:54 AM EDT ----- Pt DNKA W/ Dr. Derwood KaplanH-S for AUB. Please call to reschedule appt and review outpatient US results.

## 2017-01-18 ENCOUNTER — Ambulatory Visit: Payer: Medicaid Other | Admitting: Internal Medicine

## 2017-01-19 ENCOUNTER — Encounter (HOSPITAL_COMMUNITY): Payer: Self-pay | Admitting: *Deleted

## 2017-01-19 ENCOUNTER — Inpatient Hospital Stay (HOSPITAL_COMMUNITY)
Admission: AD | Admit: 2017-01-19 | Discharge: 2017-01-19 | Disposition: A | Discharge status: Home / Self Care | Payer: Medicaid Other | Source: Ambulatory Visit | Attending: Obstetrics and Gynecology | Admitting: Obstetrics and Gynecology

## 2017-01-19 DIAGNOSIS — Z9889 Other specified postprocedural states: Secondary | ICD-10-CM | POA: Insufficient documentation

## 2017-01-19 DIAGNOSIS — J45909 Unspecified asthma, uncomplicated: Secondary | ICD-10-CM | POA: Diagnosis not present

## 2017-01-19 DIAGNOSIS — Z87891 Personal history of nicotine dependence: Secondary | ICD-10-CM | POA: Diagnosis not present

## 2017-01-19 DIAGNOSIS — Z79899 Other long term (current) drug therapy: Secondary | ICD-10-CM | POA: Insufficient documentation

## 2017-01-19 DIAGNOSIS — F329 Major depressive disorder, single episode, unspecified: Secondary | ICD-10-CM | POA: Diagnosis not present

## 2017-01-19 DIAGNOSIS — R42 Dizziness and giddiness: Secondary | ICD-10-CM | POA: Diagnosis present

## 2017-01-19 DIAGNOSIS — N939 Abnormal uterine and vaginal bleeding, unspecified: Secondary | ICD-10-CM | POA: Diagnosis not present

## 2017-01-19 DIAGNOSIS — Z91040 Latex allergy status: Secondary | ICD-10-CM | POA: Diagnosis not present

## 2017-01-19 DIAGNOSIS — N920 Excessive and frequent menstruation with regular cycle: Secondary | ICD-10-CM | POA: Insufficient documentation

## 2017-01-19 DIAGNOSIS — N921 Excessive and frequent menstruation with irregular cycle: Secondary | ICD-10-CM

## 2017-01-19 DIAGNOSIS — B20 Human immunodeficiency virus [HIV] disease: Secondary | ICD-10-CM | POA: Insufficient documentation

## 2017-01-19 DIAGNOSIS — Z888 Allergy status to other drugs, medicaments and biological substances status: Secondary | ICD-10-CM | POA: Insufficient documentation

## 2017-01-19 DIAGNOSIS — F419 Anxiety disorder, unspecified: Secondary | ICD-10-CM | POA: Insufficient documentation

## 2017-01-19 LAB — COMPREHENSIVE METABOLIC PANEL
ALK PHOS: 74 U/L (ref 38–126)
ALT: 17 U/L (ref 14–54)
ANION GAP: 7 (ref 5–15)
AST: 19 U/L (ref 15–41)
Albumin: 3 g/dL — ABNORMAL LOW (ref 3.5–5.0)
BUN: 14 mg/dL (ref 6–20)
CALCIUM: 8.3 mg/dL — AB (ref 8.9–10.3)
CHLORIDE: 108 mmol/L (ref 101–111)
CO2: 25 mmol/L (ref 22–32)
CREATININE: 0.69 mg/dL (ref 0.44–1.00)
Glucose, Bld: 102 mg/dL — ABNORMAL HIGH (ref 65–99)
Potassium: 3.7 mmol/L (ref 3.5–5.1)
SODIUM: 140 mmol/L (ref 135–145)
Total Bilirubin: 0.1 mg/dL — ABNORMAL LOW (ref 0.3–1.2)
Total Protein: 7.1 g/dL (ref 6.5–8.1)

## 2017-01-19 LAB — CBC
HCT: 34 % — ABNORMAL LOW (ref 36.0–46.0)
Hemoglobin: 10.7 g/dL — ABNORMAL LOW (ref 12.0–15.0)
MCH: 26.8 pg (ref 26.0–34.0)
MCHC: 31.5 g/dL (ref 30.0–36.0)
MCV: 85 fL (ref 78.0–100.0)
PLATELETS: 289 10*3/uL (ref 150–400)
RBC: 4 MIL/uL (ref 3.87–5.11)
RDW: 20 % — AB (ref 11.5–15.5)
WBC: 9 10*3/uL (ref 4.0–10.5)

## 2017-01-19 LAB — URINALYSIS, ROUTINE W REFLEX MICROSCOPIC
BILIRUBIN URINE: NEGATIVE
Glucose, UA: NEGATIVE mg/dL
Ketones, ur: NEGATIVE mg/dL
Leukocytes, UA: NEGATIVE
NITRITE: NEGATIVE
PROTEIN: NEGATIVE mg/dL
SPECIFIC GRAVITY, URINE: 1.02 (ref 1.005–1.030)
pH: 7 (ref 5.0–8.0)

## 2017-01-19 LAB — TROPONIN I

## 2017-01-19 LAB — URINALYSIS, MICROSCOPIC (REFLEX)

## 2017-01-19 LAB — WET PREP, GENITAL
Clue Cells Wet Prep HPF POC: NONE SEEN
Sperm: NONE SEEN
Trich, Wet Prep: NONE SEEN
Yeast Wet Prep HPF POC: NONE SEEN

## 2017-01-19 LAB — TSH: TSH: 2.317 u[IU]/mL (ref 0.350–4.500)

## 2017-01-19 LAB — POCT PREGNANCY, URINE: PREG TEST UR: NEGATIVE

## 2017-01-19 MED ORDER — MEGESTROL ACETATE 40 MG PO TABS
160.0000 mg | ORAL_TABLET | Freq: Once | ORAL | Status: AC
  Administered 2017-01-19: 160 mg via ORAL
  Filled 2017-01-19: qty 4

## 2017-01-19 MED ORDER — MEGESTROL ACETATE 40 MG PO TABS: ORAL_TABLET | ORAL | 5 refills | Status: DC

## 2017-01-19 MED ORDER — IBUPROFEN 600 MG PO TABS
600.0000 mg | ORAL_TABLET | Freq: Four times a day (QID) | ORAL | Status: DC | PRN
  Administered 2017-01-19: 600 mg via ORAL
  Filled 2017-01-19: qty 1

## 2017-01-19 MED ORDER — IBUPROFEN 600 MG PO TABS: 600.0000 mg | ORAL_TABLET | Freq: Four times a day (QID) | ORAL | 0 refills | Status: DC | PRN

## 2017-01-19 NOTE — MAU Provider Note (Signed)
Chief Complaint: Vaginal Bleeding   First Provider Initiated Contact with Patient 01/19/17 1923      SUBJECTIVE HPI: [name] [name] is a 46 y.o. [id number] who presents to maternity admissions reporting daily vaginal bleeding x 4 months with recent onset of h/a, dizziness, and fatigue in last 3-4 weeks.  She had c/s 07/2016 at [facility] then had Nexplanon placed on 09/14/16. Her bleeding started after Nexplanon placement so she had it removed 2 weeks later and took OCPs. Bleeding temporarily improved but then became heavy again.  She presented to MAU 11/09/16 and was prescribed Megace which also temporarily lightened her bleeding but never stopped it.  She had ultrasound following this MAU visit which was normal but never followed up in the office.  She desires to follow up with gyn in [facility] or [place] instead of her previous providers in [place]. She has tried Tylenol for her h/a today which resolved the h/a but it returned a few hours later. Her bleeding is also associated with menstrual cramping, fatigue, and cold sensitivity. She denies vaginal itching/burning, urinary symptoms, n/v, or fever/chills.     HPI  Past Medical History:  Diagnosis Date  . Anemia   . Anxiety   . Asthma 04/02/2015  . 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 Depression   . Flu-like symptoms 04/02/2015  . HIV (human immunodeficiency virus infection) (HCC)   . Preterm labor   . Smoker 04/02/2015   Past Surgical History:  Procedure Laterality Date  . CESAREAN SECTION  04/2000  . CESAREAN SECTION  08/18/2011   Procedure: CESAREAN SECTION;  Surgeon: Reva Bores, MD;  Location: [facility];  Service: Gynecology;  Laterality: N/A;  Repeat.  Will need AZT 3 hours before   Social History   Social History  . Marital status: Married    Spouse name: N/A  . Number of children: N/A  . Years of education: N/A   Occupational History  . Not on file.   Social History Main Topics  . Smoking  status: Former Smoker    Packs/day: 0.50    Types: Cigarettes  . Smokeless tobacco: Never Used  . Alcohol use No  . Drug use: No  . Sexual activity: Yes    Birth control/ protection: None   Other Topics Concern  . Not on file   Social History Narrative  . No narrative on file   No current facility-administered medications on file prior to encounter.    Current Outpatient Prescriptions on File Prior to Encounter  Medication Sig Dispense Refill  . albuterol (PROVENTIL HFA;VENTOLIN HFA) 108 (90 BASE) MCG/ACT inhaler Inhale 2 puffs into the lungs every 6 (six) hours as needed for wheezing or shortness of breath. 1 Inhaler 6  . dolutegravir (TIVICAY) 50 MG tablet Take 1 tablet (50 mg total) by mouth daily. 30 tablet 11  . emtricitabine-tenofovir AF (DESCOVY) 200-25 MG tablet Take 1 tablet by mouth daily. 30 tablet 11  . FOLIC ACID PO Take 1 tablet by mouth daily.    . IRON PO Take 1 tablet by mouth every morning.    . Prenatal Vit-Fe Fumarate-FA (PRENATAL MULTIVITAMIN) TABS tablet Take 1 tablet by mouth daily at 12 noon.    Marland Kitchen Spacer/Aero-Holding Chambers (AEROCHAMBER PLUS) inhaler Use as instructed 1 each 2   Allergies  Allergen Reactions  . Nortriptyline Other (See Comments) and Nausea And Vomiting  . Influenza Vaccine Live Other (See Comments)    Claims to have had very bad  congestion and headaches for 7 days post each of her last flu shots in 2011 and 2002 REFUSES  . Latex Rash    ROS:  Review of Systems  Constitutional: Positive for fatigue. Negative for chills and fever.  Respiratory: Negative for shortness of breath.   Cardiovascular: Negative for chest pain.  Gastrointestinal: Negative for nausea and vomiting.  Genitourinary: Positive for pelvic pain and vaginal bleeding. Negative for difficulty urinating, dysuria, flank pain, vaginal discharge and vaginal pain.  Neurological: Positive for headaches. Negative for dizziness.  Psychiatric/Behavioral: Negative.      I  have reviewed patient's Past Medical Hx, Surgical Hx, Family Hx, Social Hx, medications and allergies.   Physical Exam   Patient Vitals for the past 24 hrs:  BP Temp Temp src Pulse Resp SpO2 Height Weight  01/19/17 1834 (!) 126/59 98.6 F (37 C) Oral 68 20 100 % 5\' 2"  (1.575 m) 227 lb (103 kg)   Constitutional: Well-developed, well-nourished female in mild distress.  HEART: normal rate, heart sounds, regular rhythm RESP: normal effort, lung sounds clear and equal bilaterally GI: Abd soft, non-tender. Pos BS x 4 MS: Extremities nontender, no edema, normal ROM Neurologic: Alert and oriented x 4.  GU: Neg CVAT.  PELVIC EXAM: Cervix pink, visually closed, without lesion, small amount dark red bleeding without clots, vaginal walls and external genitalia normal Bimanual exam: Cervix 0/long/high, firm, anterior, neg CMT, uterus nontender, nonenlarged, adnexa without tenderness, enlargement, or mass   LAB RESULTS Results for orders placed or performed during the hospital encounter of 01/19/17 (from the past 24 hour(s))  Urinalysis, Routine w reflex microscopic     Status: Abnormal   Collection Time: 01/19/17  6:27 PM  Result Value Ref Range   Color, Urine YELLOW YELLOW   APPearance CLEAR CLEAR   Specific Gravity, Urine 1.020 1.005 - 1.030   pH 7.0 5.0 - 8.0   Glucose, UA NEGATIVE NEGATIVE mg/dL   Hgb urine dipstick LARGE (A) NEGATIVE   Bilirubin Urine NEGATIVE NEGATIVE   Ketones, ur NEGATIVE NEGATIVE mg/dL   Protein, ur NEGATIVE NEGATIVE mg/dL   Nitrite NEGATIVE NEGATIVE   Leukocytes, UA NEGATIVE NEGATIVE  Urinalysis, Microscopic (reflex)     Status: Abnormal   Collection Time: 01/19/17  6:27 PM  Result Value Ref Range   RBC / HPF TOO NUMEROUS TO COUNT 0 - 5 RBC/hpf   WBC, UA 0-5 0 - 5 WBC/hpf   Bacteria, UA FEW (A) NONE SEEN   Squamous Epithelial / LPF 0-5 (A) NONE SEEN   Mucus PRESENT   Pregnancy, urine POC     Status: None   Collection Time: 01/19/17  6:50 PM  Result Value  Ref Range   Preg Test, Ur NEGATIVE NEGATIVE  CBC     Status: Abnormal   Collection Time: 01/19/17  7:40 PM  Result Value Ref Range   WBC 9.0 4.0 - 10.5 K/uL   RBC 4.00 3.87 - 5.11 MIL/uL   Hemoglobin 10.7 (L) 12.0 - 15.0 g/dL   HCT 44.034.0 (L) 10.236.0 - 72.546.0 %   MCV 85.0 78.0 - 100.0 fL   MCH 26.8 26.0 - 34.0 pg   MCHC 31.5 30.0 - 36.0 g/dL   RDW 36.620.0 (H) 44.011.5 - 34.715.5 %   Platelets 289 150 - 400 K/uL  Comprehensive metabolic panel     Status: Abnormal   Collection Time: 01/19/17  7:40 PM  Result Value Ref Range   Sodium 140 135 - 145 mmol/L   Potassium 3.7 3.5 -  5.1 mmol/L   Chloride 108 101 - 111 mmol/L   CO2 25 22 - 32 mmol/L   Glucose, Bld 102 (H) 65 - 99 mg/dL   BUN 14 6 - 20 mg/dL   Creatinine, Ser 6.96 0.44 - 1.00 mg/dL   Calcium 8.3 (L) 8.9 - 10.3 mg/dL   Total Protein 7.1 6.5 - 8.1 g/dL   Albumin 3.0 (L) 3.5 - 5.0 g/dL   AST 19 15 - 41 U/L   ALT 17 14 - 54 U/L   Alkaline Phosphatase 74 38 - 126 U/L   Total Bilirubin 0.1 (L) 0.3 - 1.2 mg/dL   GFR calc non Af Amer >60 >60 mL/min   GFR calc Af Amer >60 >60 mL/min   Anion gap 7 5 - 15  Troponin I     Status: None   Collection Time: 01/19/17  7:40 PM  Result Value Ref Range   Troponin I <0.03 <0.03 ng/mL  Wet prep, genital     Status: Abnormal   Collection Time: 01/19/17  9:05 PM  Result Value Ref Range   Yeast Wet Prep HPF POC NONE SEEN NONE SEEN   Trich, Wet Prep NONE SEEN NONE SEEN   Clue Cells Wet Prep HPF POC NONE SEEN NONE SEEN   WBC, Wet Prep HPF POC FEW (A) NONE SEEN   Sperm NONE SEEN     --/--/O POS (07/03 1312)  IMAGING No results found.  MAU Management/MDM: Ordered labs and reviewed results. Hgb stable from 2 months ago. Pelvic exam unremarkable.  No structural reasons found on previous ultrasound for bleeding but thickened endometrium noted.  Will treat with ibuprofen and Megace, first doses given in MAU tonight. Pt took Megace previously without resolution of bleeding so will give larger dose until  follow up in the office. Rx for both for pt to start tomorrow.  Pt to f/u in [facility] office for AUB.  TSH pending. Pt stable at time of discharge.  ASSESSMENT 1. Abnormal uterine bleeding (AUB)   2. Menorrhagia with irregular cycle     PLAN Discharge home with bleeding precautions  Allergies as of 01/19/2017      Reactions   Nortriptyline Other (See Comments), Nausea And Vomiting   Influenza Vaccine Live Other (See Comments)   Claims to have had very bad congestion and headaches for 7 days post each of her last flu shots in 2011 and 2002 REFUSES   Latex Rash      Medication List    TAKE these medications   AEROCHAMBER PLUS inhaler Use as instructed   albuterol 108 (90 Base) MCG/ACT inhaler Commonly known as:  PROVENTIL HFA;VENTOLIN HFA Inhale 2 puffs into the lungs every 6 (six) hours as needed for wheezing or shortness of breath.   dolutegravir 50 MG tablet Commonly known as:  TIVICAY Take 1 tablet (50 mg total) by mouth daily.   emtricitabine-tenofovir AF 200-25 MG tablet Commonly known as:  DESCOVY Take 1 tablet by mouth daily.   FOLIC ACID PO Take 1 tablet by mouth daily.   ibuprofen 600 MG tablet Commonly known as:  ADVIL,MOTRIN Take 1 tablet (600 mg total) by mouth every 6 (six) hours as needed for fever or headache.   IRON PO Take 1 tablet by mouth every morning.   megestrol 40 MG tablet Commonly known as:  MEGACE Take two tablets (80 mg) twice per day for 3 days then take one tablet (40 mg) twice per day every day If bleeding stops, you  may drop to one table (40 mg) daily but return to twice daily if bleeding returns What changed:  how much to take  how to take this  when to take this  additional instructions   prenatal multivitamin Tabs tablet Take 1 tablet by mouth daily at 12 noon.            Discharge Care Instructions        Start     Ordered   01/19/17 0000  ibuprofen (ADVIL,MOTRIN) 600 MG tablet  Every 6 hours PRN    Question:   Supervising Provider  Answer:  Lyndel Safe NILES   01/19/17 2137   01/19/17 0000  megestrol (MEGACE) 40 MG tablet    Question:  Supervising Provider  Answer:  Lyndel Safe NILES   01/19/17 2137   01/19/17 0000  Discharge patient    Question Answer Comment  Discharge disposition 01-Home or Self Care   Discharge patient date 01/19/2017      01/19/17 2137     Follow-up Information    [facility] Follow up.   Why:  The office will call you with an appointment. Return to MAU as needed for emergencies. Contact information: [address], [address] [place] [phone]          Sharen Counter Certified Nurse-Midwife 01/19/2017  9:59 PM

## 2017-01-19 NOTE — MAU Note (Signed)
Not in lobby

## 2017-01-19 NOTE — MAU Note (Signed)
Pt presents with c/o VB since May, states was seen in MAU & received meds that decreased flow, but VB has never stopped.  Pt states now having lower abdominal cramping and H/A.  Pt also reports having chest pain for 2 days, denies dyspnea or SOB.

## 2017-01-19 NOTE — MAU Note (Signed)
Pt reports vaginal bleeding continuously since May, changing pads q one hour, headache, dizziness, and chest pain.

## 2017-01-20 LAB — GC/CHLAMYDIA PROBE AMP (~~LOC~~) NOT AT ARMC
CHLAMYDIA, DNA PROBE: NEGATIVE
Neisseria Gonorrhea: NEGATIVE

## 2017-01-21 ENCOUNTER — Telehealth: Payer: Self-pay

## 2017-01-21 NOTE — Telephone Encounter (Signed)
Left message for patient to return call to office for appointment date and time- patient set up for Oct 15th. Armandina Stammer RNBSN

## 2017-01-21 NOTE — Telephone Encounter (Signed)
-----   Message from Hurshel Party, CNM sent at 01/19/2017  9:49 PM EDT ----- Regarding: New Gyn pt This gyn pt needs follow up for AUB. It looks like [place] had [place] set up with [place] and then to follow up in the office in July but she did not return call for appointment. She says she will follow up with [place] now. Please call her with first available appointment with Dr  Erin Fulling (prefers female providers).  I started her on Megace to slow bleeding until she comes to the office. Thank you.

## 2017-01-27 ENCOUNTER — Ambulatory Visit (INDEPENDENT_AMBULATORY_CARE_PROVIDER_SITE_OTHER): Payer: Medicaid Other | Admitting: Internal Medicine

## 2017-01-27 ENCOUNTER — Encounter: Payer: Self-pay | Admitting: Internal Medicine

## 2017-01-27 VITALS — BP 91/64 | HR 71 | Temp 98.2°F | Wt 233.0 lb

## 2017-01-27 DIAGNOSIS — R5383 Other fatigue: Secondary | ICD-10-CM | POA: Diagnosis not present

## 2017-01-27 DIAGNOSIS — N939 Abnormal uterine and vaginal bleeding, unspecified: Secondary | ICD-10-CM | POA: Diagnosis not present

## 2017-01-27 DIAGNOSIS — B20 Human immunodeficiency virus [HIV] disease: Secondary | ICD-10-CM
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
## 2017-01-28 LAB — RPR: RPR: NONREACTIVE

## 2017-01-28 LAB — T-HELPER CELL (CD4) - (RCID CLINIC ONLY)
CD4 % Helper T Cell: 35 % (ref 33–55)
CD4 T Cell Abs: 1020 /uL (ref 400–2700)

## 2017-01-31 LAB — HIV-1 RNA QUANT-NO REFLEX-BLD
HIV 1 RNA Quant: <20 copies/mL
HIV-1 RNA Quant, Log: <1.3 Log copies/mL

## 2017-02-21 ENCOUNTER — Ambulatory Visit: Payer: Medicaid Other | Admitting: Obstetrics & Gynecology

## 2017-03-07 ENCOUNTER — Ambulatory Visit: Payer: Medicaid Other | Admitting: Obstetrics & Gynecology

## 2017-03-22 ENCOUNTER — Ambulatory Visit: Payer: Self-pay

## 2017-03-28 ENCOUNTER — Ambulatory Visit: Payer: Self-pay

## 2017-03-29 ENCOUNTER — Other Ambulatory Visit: Payer: Self-pay | Admitting: *Deleted

## 2017-03-29 DIAGNOSIS — B20 Human immunodeficiency virus [HIV] disease: Secondary | ICD-10-CM

## 2017-03-29 MED ORDER — EMTRICITABINE-TENOFOVIR AF 200-25 MG PO TABS: 1.0000 | ORAL_TABLET | Freq: Every day | ORAL | 1 refills | Status: DC

## 2017-03-29 MED ORDER — DOLUTEGRAVIR SODIUM 50 MG PO TABS: 50.0000 mg | ORAL_TABLET | Freq: Every day | ORAL | 1 refills | Status: DC

## 2017-03-29 NOTE — Progress Notes (Signed)
For patient assistance per [place]

## 2017-04-25 ENCOUNTER — Encounter: Payer: Self-pay | Admitting: Internal Medicine

## 2017-05-31 ENCOUNTER — Other Ambulatory Visit: Payer: Self-pay | Admitting: *Deleted

## 2017-05-31 DIAGNOSIS — B20 Human immunodeficiency virus [HIV] disease: Secondary | ICD-10-CM

## 2017-05-31 MED ORDER — DOLUTEGRAVIR SODIUM 50 MG PO TABS: 50.0000 mg | ORAL_TABLET | Freq: Every day | ORAL | 5 refills | Status: DC

## 2017-05-31 MED ORDER — EMTRICITABINE-TENOFOVIR AF 200-25 MG PO TABS: 1.0000 | ORAL_TABLET | Freq: Every day | ORAL | 5 refills | Status: DC

## 2017-07-04 ENCOUNTER — Other Ambulatory Visit: Payer: Medicaid Other

## 2017-07-04 ENCOUNTER — Other Ambulatory Visit: Payer: Self-pay | Admitting: *Deleted

## 2017-07-04 DIAGNOSIS — B20 Human immunodeficiency virus [HIV] disease: Secondary | ICD-10-CM

## 2017-07-05 ENCOUNTER — Other Ambulatory Visit: Payer: BLUE CROSS/BLUE SHIELD

## 2017-07-05 DIAGNOSIS — B20 Human immunodeficiency virus [HIV] disease: Secondary | ICD-10-CM

## 2017-07-06 ENCOUNTER — Encounter: Payer: Self-pay | Admitting: Internal Medicine

## 2017-07-06 LAB — T-HELPER CELL (CD4) - (RCID CLINIC ONLY)
CD4 % Helper T Cell: 28 % — ABNORMAL LOW (ref 33–55)
CD4 T CELL ABS: 830 /uL (ref 400–2700)

## 2017-07-07 LAB — COMPLETE METABOLIC PANEL WITH GFR
AG Ratio: 1.2 (calc) (ref 1.0–2.5)
ALT: 11 U/L (ref 6–29)
AST: 12 U/L (ref 10–35)
Albumin: 3.6 g/dL (ref 3.6–5.1)
Alkaline phosphatase (APISO): 96 U/L (ref 33–115)
BILIRUBIN TOTAL: 0.2 mg/dL (ref 0.2–1.2)
BUN: 12 mg/dL (ref 7–25)
CHLORIDE: 107 mmol/L (ref 98–110)
CO2: 25 mmol/L (ref 20–32)
Calcium: 8.9 mg/dL (ref 8.6–10.2)
Creat: 0.58 mg/dL (ref 0.50–1.10)
GFR, Est African American: 126 mL/min/{1.73_m2} (ref >=60)
GFR, Est Non African American: 109 mL/min/{1.73_m2} (ref >=60)
GLOBULIN: 3 g/dL (ref 1.9–3.7)
Glucose, Bld: 84 mg/dL (ref 65–99)
POTASSIUM: 3.8 mmol/L (ref 3.5–5.3)
SODIUM: 140 mmol/L (ref 135–146)
Total Protein: 6.6 g/dL (ref 6.1–8.1)

## 2017-07-07 LAB — CBC WITH DIFFERENTIAL/PLATELET
BASOS PCT: 0.5 %
Basophils Absolute: 38 cells/uL (ref 0–200)
Eosinophils Absolute: 91 cells/uL (ref 15–500)
Eosinophils Relative: 1.2 %
HCT: 31.1 % — ABNORMAL LOW (ref 35.0–45.0)
HEMOGLOBIN: 10.2 g/dL — AB (ref 11.7–15.5)
Lymphs Abs: 2592 cells/uL (ref 850–3900)
MCH: 25.8 pg — ABNORMAL LOW (ref 27.0–33.0)
MCHC: 32.8 g/dL (ref 32.0–36.0)
MCV: 78.5 fL — AB (ref 80.0–100.0)
MPV: 10.5 fL (ref 7.5–12.5)
Monocytes Relative: 7.7 %
NEUTROS ABS: 4294 {cells}/uL (ref 1500–7800)
Neutrophils Relative %: 56.5 %
PLATELETS: 307 10*3/uL (ref 140–400)
RBC: 3.96 10*6/uL (ref 3.80–5.10)
RDW: 18.7 % — ABNORMAL HIGH (ref 11.0–15.0)
TOTAL LYMPHOCYTE: 34.1 %
WBC: 7.6 10*3/uL (ref 3.8–10.8)
WBCMIX: 585 {cells}/uL (ref 200–950)

## 2017-07-07 LAB — HIV-1 RNA QUANT-NO REFLEX-BLD
HIV 1 RNA Quant: <20 copies/mL
HIV-1 RNA QUANT, LOG: NOT DETECTED {Log_copies}/mL

## 2017-07-18 ENCOUNTER — Ambulatory Visit: Payer: Medicaid Other | Admitting: Internal Medicine

## 2017-07-20 ENCOUNTER — Ambulatory Visit (INDEPENDENT_AMBULATORY_CARE_PROVIDER_SITE_OTHER): Payer: BLUE CROSS/BLUE SHIELD | Admitting: Internal Medicine

## 2017-07-20 ENCOUNTER — Encounter: Payer: Self-pay | Admitting: Internal Medicine

## 2017-07-20 VITALS — BP 123/78 | HR 62 | Temp 98.3°F | Wt 230.0 lb

## 2017-07-20 DIAGNOSIS — N939 Abnormal uterine and vaginal bleeding, unspecified: Secondary | ICD-10-CM | POA: Diagnosis not present

## 2017-07-20 DIAGNOSIS — B20 Human immunodeficiency virus [HIV] disease: Secondary | ICD-10-CM

## 2017-07-20 DIAGNOSIS — D500 Iron deficiency anemia secondary to blood loss (chronic): Secondary | ICD-10-CM

## 2017-07-20 DIAGNOSIS — R5383 Other fatigue: Secondary | ICD-10-CM | POA: Diagnosis not present
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
## 2017-08-13 ENCOUNTER — Inpatient Hospital Stay (HOSPITAL_COMMUNITY)
Admission: AD | Admit: 2017-08-13 | Discharge: 2017-08-13 | Disposition: A | Discharge status: Home / Self Care | Payer: BLUE CROSS/BLUE SHIELD | Source: Ambulatory Visit | Attending: Obstetrics and Gynecology | Admitting: Obstetrics and Gynecology

## 2017-08-13 ENCOUNTER — Encounter (HOSPITAL_COMMUNITY): Payer: Self-pay | Admitting: *Deleted

## 2017-08-13 DIAGNOSIS — D500 Iron deficiency anemia secondary to blood loss (chronic): Secondary | ICD-10-CM | POA: Insufficient documentation

## 2017-08-13 DIAGNOSIS — N938 Other specified abnormal uterine and vaginal bleeding: Secondary | ICD-10-CM | POA: Diagnosis not present

## 2017-08-13 DIAGNOSIS — Z87891 Personal history of nicotine dependence: Secondary | ICD-10-CM | POA: Insufficient documentation

## 2017-08-13 LAB — CBC WITH DIFFERENTIAL/PLATELET
Basophils Absolute: 0 K/uL (ref 0.0–0.1)
Basophils Relative: 0 %
Eosinophils Absolute: 0.1 K/uL (ref 0.0–0.7)
Eosinophils Relative: 2 %
HCT: 31.1 % — ABNORMAL LOW (ref 36.0–46.0)
Hemoglobin: 9.7 g/dL — ABNORMAL LOW (ref 12.0–15.0)
Lymphocytes Relative: 47 %
Lymphs Abs: 2.6 K/uL (ref 0.7–4.0)
MCH: 25.7 pg — ABNORMAL LOW (ref 26.0–34.0)
MCHC: 31.2 g/dL (ref 30.0–36.0)
MCV: 82.5 fL (ref 78.0–100.0)
Monocytes Absolute: 0.2 K/uL (ref 0.1–1.0)
Monocytes Relative: 4 %
Neutro Abs: 2.6 K/uL (ref 1.7–7.7)
Neutrophils Relative %: 47 %
Platelets: 264 K/uL (ref 150–400)
RBC: 3.77 MIL/uL — ABNORMAL LOW (ref 3.87–5.11)
RDW: 19.2 % — ABNORMAL HIGH (ref 11.5–15.5)
WBC: 5.6 K/uL (ref 4.0–10.5)

## 2017-08-13 MED ORDER — KETOROLAC TROMETHAMINE 60 MG/2ML IM SOLN
60.0000 mg | Freq: Once | INTRAMUSCULAR | Status: AC
  Administered 2017-08-13: 60 mg via INTRAMUSCULAR
  Filled 2017-08-13: qty 2

## 2017-08-13 MED ORDER — MEGESTROL ACETATE 40 MG PO TABS: 40.0000 mg | ORAL_TABLET | Freq: Three times a day (TID) | ORAL | 0 refills | Status: DC

## 2017-08-13 NOTE — [facility] Provider Note (Signed)
History   Pt is 47 yo female who is in c/o vag bleeding for one year. States has bleed every day since her last baby. Pt has had several appts she has not kept. Pt states clots are large. She wants intervention today to stop bleeding. Explained to pt that it is important she keep clinic appt and that they would see her without insurance. But she states she does not want to pay for visit.... Stressed importance of keeping appt and seeing the doctor.  CSN: [id number]  Arrival date & time 08/13/17  1644   First Provider Initiated Contact with Patient 08/13/17 1713      Chief Complaint  Patient presents with  . Vaginal Bleeding  . Abdominal Pain    HPI  Past Medical History:  Diagnosis Date  . Anemia   . Anxiety   . Asthma 04/02/2015  . 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 Depression   . Flu-like symptoms 04/02/2015  . HIV (human immunodeficiency virus infection) (HCC)   . Preterm labor   . Smoker 04/02/2015    Past Surgical History:  Procedure Laterality Date  . CESAREAN SECTION  04/2000  . CESAREAN SECTION  08/18/2011   Procedure: CESAREAN SECTION;  Surgeon: Reva Bores, MD;  Location: [facility];  Service: Gynecology;  Laterality: N/A;  Repeat.  Will need AZT 3 hours before    Family History  Problem Relation Age of Onset  . Other Neg Hx     Social History   Tobacco Use  . Smoking status: Former Smoker    Packs/day: 0.50    Types: Cigarettes  . Smokeless tobacco: Never Used  Substance Use Topics  . Alcohol use: No    Alcohol/week: 0.0 oz  . Drug use: No    OB History    Gravida  7   Para  5   Term  3   Preterm  1   AB  2   Living  5     SAB  2   TAB      Ectopic      Multiple      Live Births  2           Review of Systems  Constitutional: Positive for fatigue.  HENT: Negative.   Eyes: Negative.   Respiratory: Negative.   Gastrointestinal: Positive for abdominal pain.  Endocrine: Negative.   Genitourinary:  Positive for vaginal bleeding.  Musculoskeletal: Negative.   Skin: Negative.   Allergic/Immunologic: Negative.   Neurological: Negative.   Hematological: Negative.   Psychiatric/Behavioral: Negative.     Allergies  Nortriptyline; Influenza vaccine live; and Latex  Home Medications    BP (!) 109/53 (BP Location: Right Arm)   Pulse 77   Temp 98.4 F (36.9 C) (Oral)   Resp 17   SpO2 100%   Physical Exam  Constitutional: She is oriented to person, place, and time. She appears well-developed and well-nourished.  HENT:  Head: Normocephalic.  Eyes: Pupils are equal, round, and reactive to light.  Neck: Normal range of motion.  Cardiovascular: Normal rate, regular rhythm, normal heart sounds and intact distal pulses.  Pulmonary/Chest: Effort normal and breath sounds normal.  Abdominal: Soft. Bowel sounds are normal.  Genitourinary: Vagina normal and uterus normal.  Musculoskeletal: Normal range of motion.  Neurological: She is alert and oriented to person, place, and time. She has normal reflexes.  Skin: Skin is warm and dry.  Psychiatric: She has a normal mood  and affect. Her behavior is normal. Judgment and thought content normal.    [facility] Course  Procedures (including critical care time)  Labs Reviewed  CBC WITH DIFFERENTIAL/PLATELET   No results found. Results for orders placed or performed during the hospital encounter of 08/13/17 (from the past 24 hour(s))  CBC with Differential/Platelet     Status: Abnormal   Collection Time: 08/13/17  5:16 PM  Result Value Ref Range   WBC 5.6 4.0 - 10.5 K/uL   RBC 3.77 (L) 3.87 - 5.11 MIL/uL   Hemoglobin 9.7 (L) 12.0 - 15.0 g/dL   HCT 40.931.1 (L) 81.136.0 - 91.446.0 %   MCV 82.5 78.0 - 100.0 fL   MCH 25.7 (L) 26.0 - 34.0 pg   MCHC 31.2 30.0 - 36.0 g/dL   RDW 78.219.2 (H) 95.611.5 - 21.315.5 %   Platelets 264 150 - 400 K/uL   Neutrophils Relative % 47 %   Neutro Abs 2.6 1.7 - 7.7 K/uL   Lymphocytes Relative 47 %   Lymphs Abs 2.6 0.7 - 4.0 K/uL    Monocytes Relative 4 %   Monocytes Absolute 0.2 0.1 - 1.0 K/uL   Eosinophils Relative 2 %   Eosinophils Absolute 0.1 0.0 - 0.7 K/uL   Basophils Relative 0 %   Basophils Absolute 0.0 0.0 - 0.1 K/uL    1. DUB (dysfunctional uterine bleeding)   2. Iron deficiency anemia due to chronic blood loss       MDM  VSS, sm amt dark vag bleeding. Message to GYN clinic to get pt in for appt ASAP. Stressed to pt importance of keeping appt. Stressed to continue iron. Will start megace 40 TID and d/c home in stable condition.

## 2017-08-13 NOTE — Discharge Instructions (Signed)

## 2017-08-15 ENCOUNTER — Encounter: Payer: Self-pay | Admitting: Obstetrics and Gynecology

## 2017-08-18 ENCOUNTER — Ambulatory Visit (INDEPENDENT_AMBULATORY_CARE_PROVIDER_SITE_OTHER): Payer: BLUE CROSS/BLUE SHIELD | Admitting: Internal Medicine

## 2017-08-18 ENCOUNTER — Telehealth: Payer: Self-pay

## 2017-08-18 ENCOUNTER — Encounter: Payer: Self-pay | Admitting: Internal Medicine

## 2017-08-18 VITALS — BP 109/77 | HR 98 | Temp 98.2°F | Wt 231.0 lb

## 2017-08-18 DIAGNOSIS — N939 Abnormal uterine and vaginal bleeding, unspecified: Secondary | ICD-10-CM

## 2017-08-18 DIAGNOSIS — B20 Human immunodeficiency virus [HIV] disease: Secondary | ICD-10-CM

## 2017-08-18 DIAGNOSIS — D500 Iron deficiency anemia secondary to blood loss (chronic): Secondary | ICD-10-CM

## 2017-08-18 MED ORDER — BICTEGRAVIR-EMTRICITAB-TENOFOV 50-200-25 MG PO TABS: 1.0000 | ORAL_TABLET | Freq: Every day | ORAL | 11 refills | Status: DC

## 2017-08-18 NOTE — Telephone Encounter (Signed)
Per Dr. Drue SecondSnider I called [facility] to cancel a prescription that was sent in error. Was able to speak to a pharmacist who was able to help me cancel the prescription.  Lorenso CourierJose L Hymen Arnett, [place]
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
## 2017-08-18 NOTE — Patient Instructions (Signed)
Come back labs in 2 months

## 2017-08-25 ENCOUNTER — Ambulatory Visit: Payer: BLUE CROSS/BLUE SHIELD | Admitting: Internal Medicine

## 2017-09-05 ENCOUNTER — Other Ambulatory Visit: Payer: Self-pay | Admitting: Internal Medicine

## 2017-09-05 DIAGNOSIS — B20 Human immunodeficiency virus [HIV] disease: Secondary | ICD-10-CM

## 2017-09-14 ENCOUNTER — Telehealth: Payer: Self-pay | Admitting: *Deleted

## 2017-09-14 ENCOUNTER — Telehealth (HOSPITAL_COMMUNITY): Payer: Self-pay | Admitting: *Deleted

## 2017-09-14 NOTE — Telephone Encounter (Signed)
Patient states she has been experiencing headaches, stomach pain since starting Biktarvy about 1 month ago. She states she has to take 1 500 mg tylenol soon after taking Biktarvy or she will get "massive headaches."  This started with her first dose, has persisted. She describes her stomach pain as "pinching in the top part of my abdomen" but not like needing to pass stool or the return of menstrual cramps.  She stopped megace completely 1 month ago, approximately 4-5 days before starting Biktarvy. Andree Coss, RN

## 2017-09-15 ENCOUNTER — Encounter: Payer: BLUE CROSS/BLUE SHIELD | Admitting: Obstetrics and Gynecology

## 2017-09-21 ENCOUNTER — Other Ambulatory Visit: Payer: Self-pay | Admitting: Internal Medicine

## 2017-09-21 DIAGNOSIS — B20 Human immunodeficiency virus [HIV] disease: Secondary | ICD-10-CM

## 2017-11-14 ENCOUNTER — Other Ambulatory Visit: Payer: Self-pay

## 2017-11-14 DIAGNOSIS — B20 Human immunodeficiency virus [HIV] disease: Secondary | ICD-10-CM

## 2017-11-16 LAB — T-HELPER CELL (CD4) - (RCID CLINIC ONLY)
CD4 % Helper T Cell: 24 % — ABNORMAL LOW (ref 33–55)
CD4 T CELL ABS: 840 /uL (ref 400–2700)

## 2017-11-16 LAB — HIV-1 RNA QUANT-NO REFLEX-BLD
HIV 1 RNA Quant: <20 copies/mL
HIV-1 RNA QUANT, LOG: NOT DETECTED {Log_copies}/mL

## 2017-11-28 ENCOUNTER — Encounter: Payer: BLUE CROSS/BLUE SHIELD | Admitting: Internal Medicine

## 2017-12-08 ENCOUNTER — Encounter: Payer: Self-pay | Admitting: Internal Medicine

## 2017-12-17 IMAGING — US US OB TRANSVAGINAL
1 series · 15 of 28 positions shown · non-contrast
Comparison: None.

CLINICAL DATA: Abdominal pain and pregnancy. Cramping. History of
female circumcision with inflammation. HIV positive. Beta HCG not
rising appropriately. Gravida 6 para 3. Unknown LMP.

EXAM:
OBSTETRIC <14 WK ULTRASOUND
TECHNIQUE: Transabdominal ultrasound was performed for evaluation of the
gestation as well as the maternal uterus and adnexal regions.

[Series 1: us ob transvaginal · 15 of 63 slices shown]
[im 1/63]
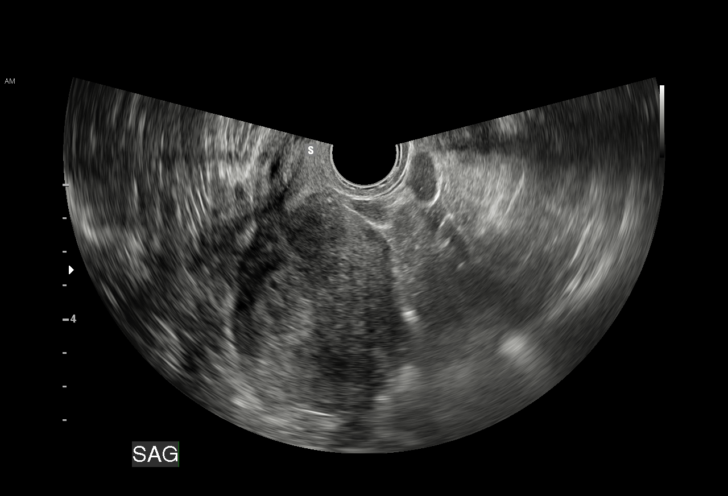
[im 5/63]
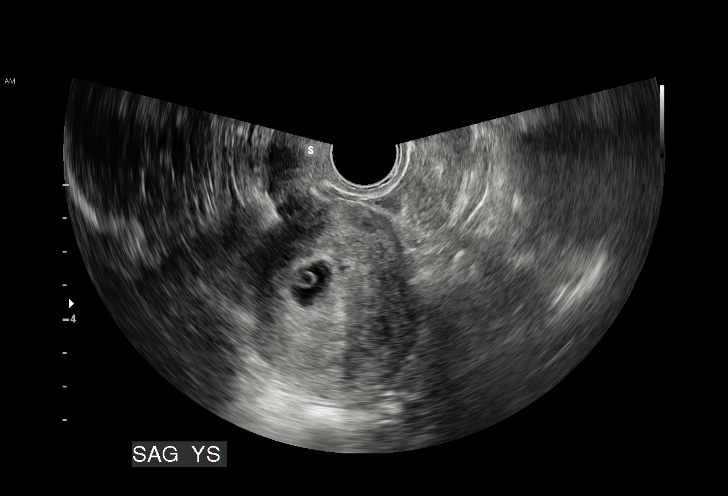
[im 10/63]
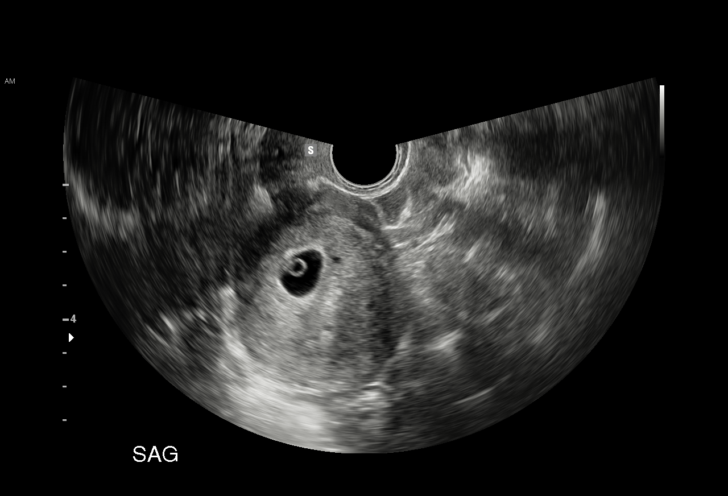
[im 14/63]
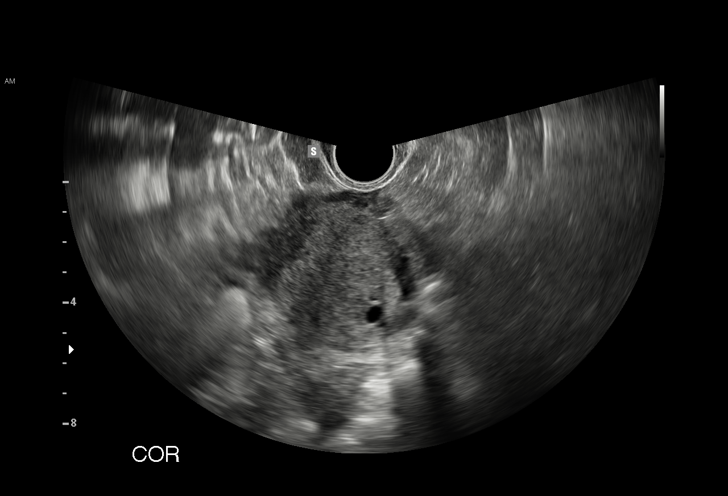
[im 19/63]
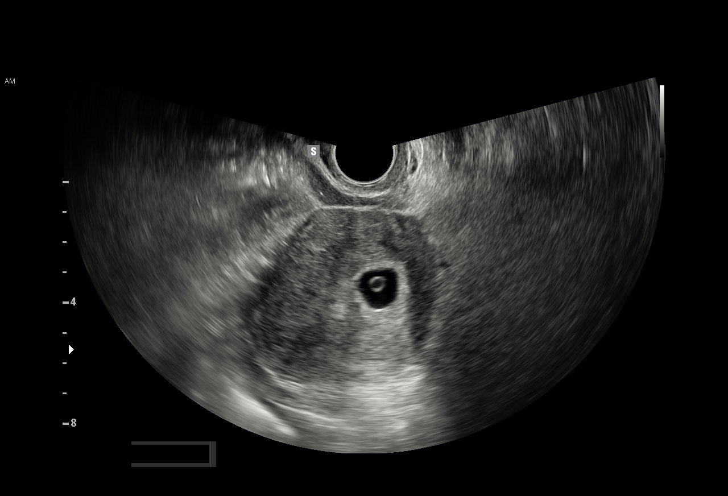
[im 23/63]
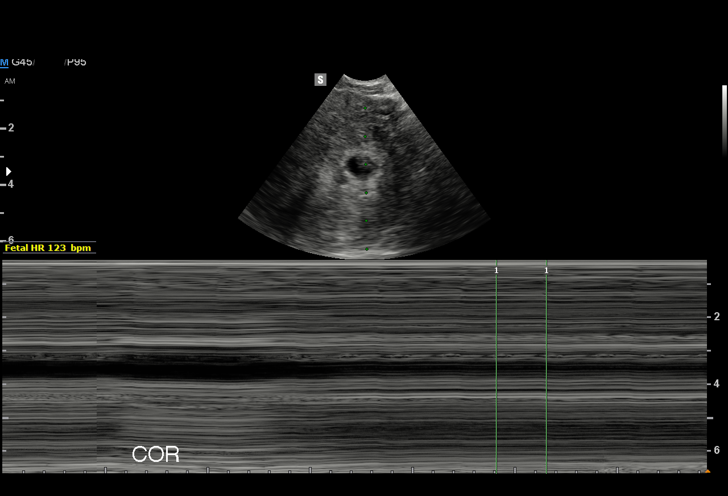
[im 28/63]
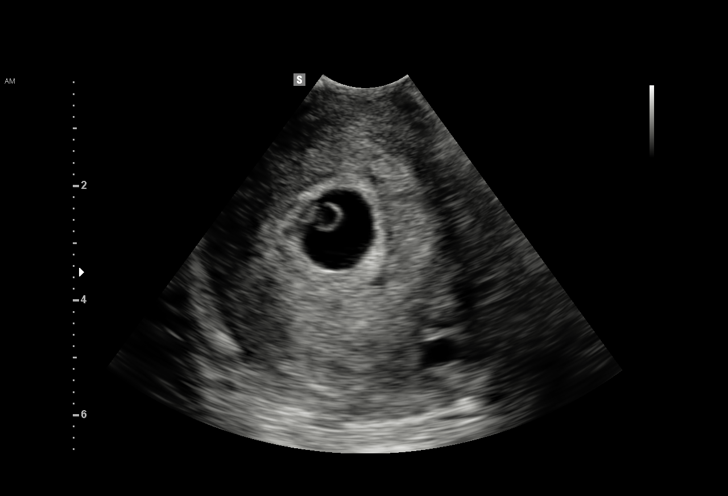
[im 33/63]
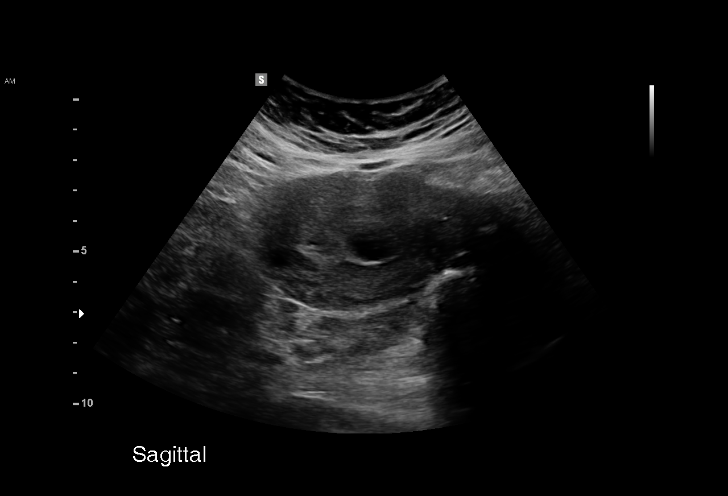
[im 35/63]
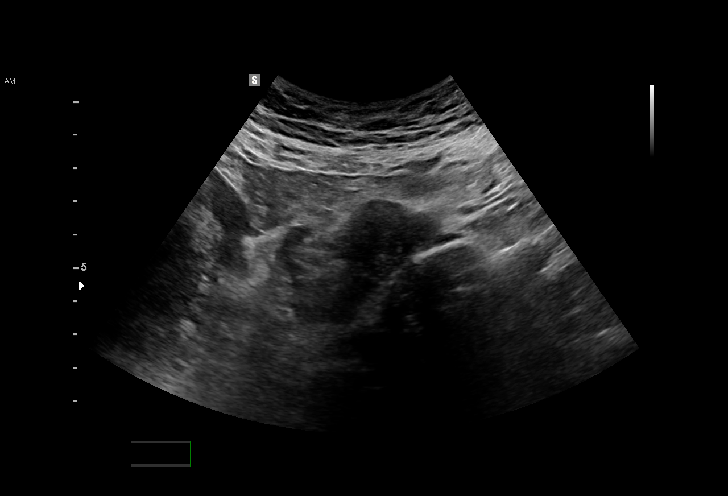
[im 40/63]
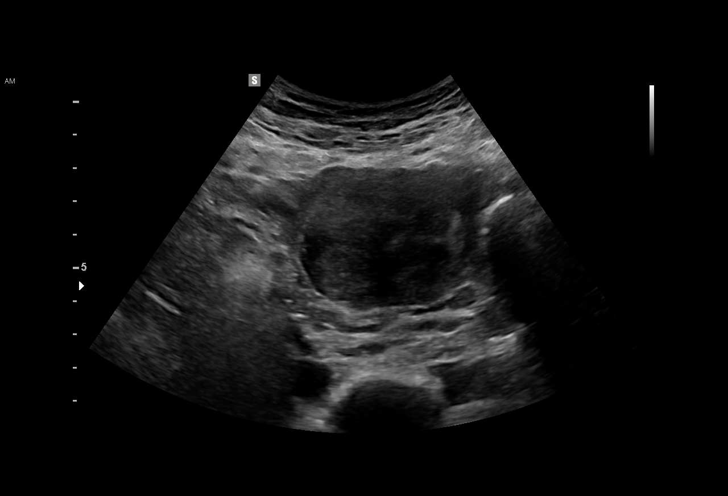
[im 44/63]
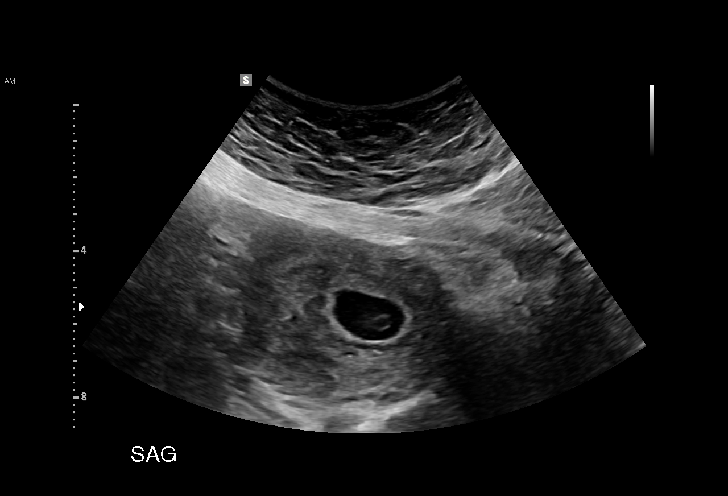
[im 49/63]
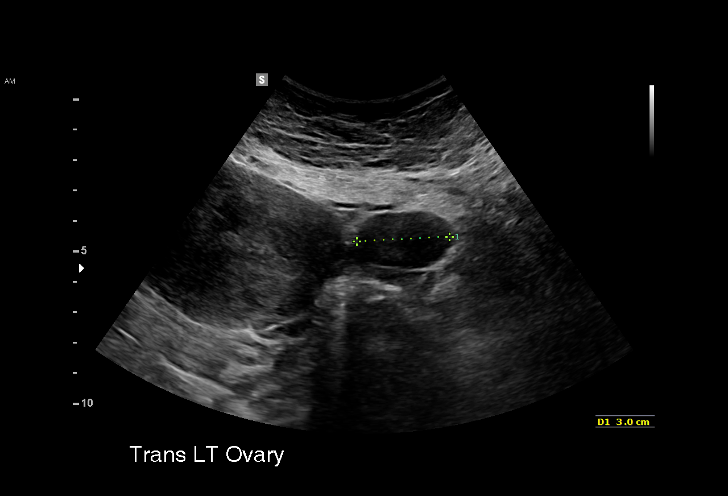
[im 53/63]
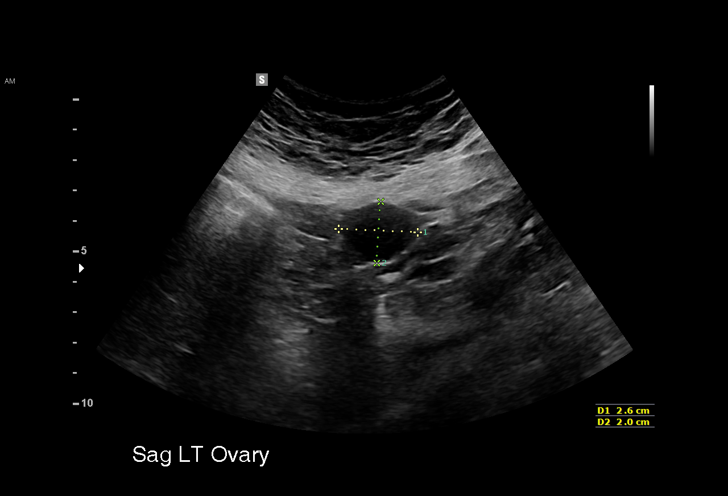
[im 58/63]
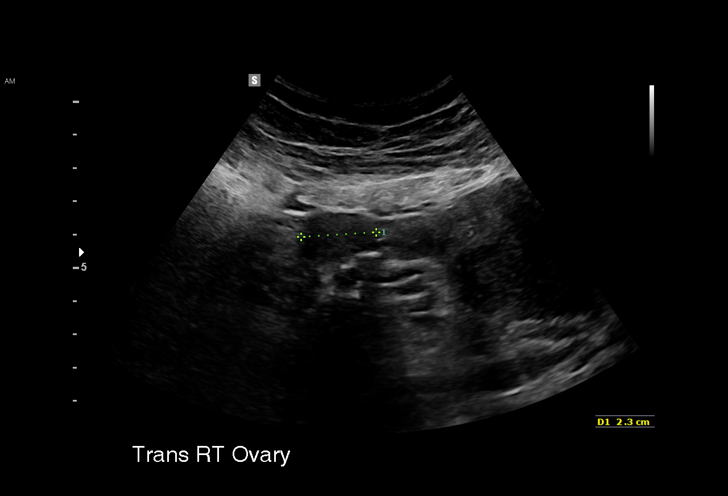
[im 63/63]
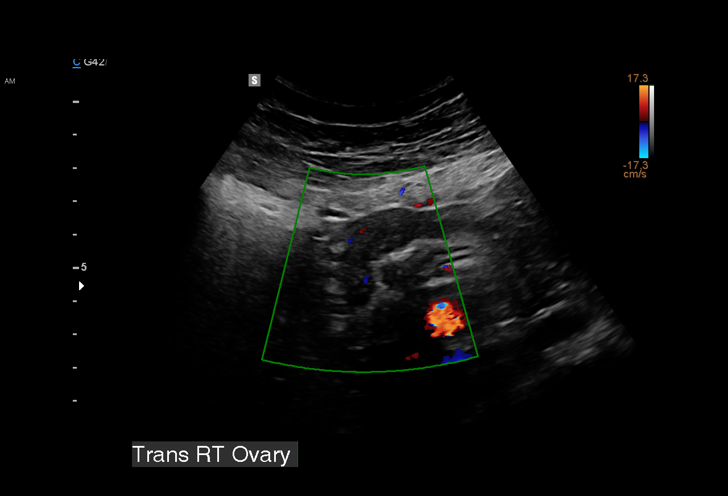

[15 of 28 positions shown; findings below may reference images not displayed]

FINDINGS: Intrauterine gestational sac: Present

Yolk sac:  Present

Embryo:  Present

Cardiac Activity: Present

Heart Rate: 123 bpm

CRL:   4.5  mm   6 w 1 d                  US EDC: 08/12/2016

Subchorionic hemorrhage:  None visualized.

Maternal uterus/adnexae: The ovaries have a normal appearance. Left
corpus luteum cyst is present.
IMPRESSION: 1. Single living intrauterine embryo measuring 6 weeks 1 day.
2. By today's exam EDC is 08/12/2016.

## 2018-01-19 ENCOUNTER — Telehealth: Payer: Self-pay

## 2018-01-19 NOTE — Telephone Encounter (Signed)
Patient is on Overdue pap list. Left a generic voicemail for patient to call office back. If patient calls back just need to ask when was her last pap and where.  Lorenso CourierJose L Nonna Renninger, [place]

## 2018-01-24 NOTE — Telephone Encounter (Signed)
Patient returned call, stated she is unable to schedule at the moment due to child custody/court issues.  She will call back after 02/06/18. She reports [facility]16 while she was pregnant with her son. Results available via CareEverywhere. RN updated health maintenance. Andree CossHowell, Emrik Erhard M, RN

## 2018-03-19 ENCOUNTER — Emergency Department (HOSPITAL_BASED_OUTPATIENT_CLINIC_OR_DEPARTMENT_OTHER)
Admission: EM | Admit: 2018-03-19 | Discharge: 2018-03-19 | Disposition: A | Discharge status: Home / Self Care | Payer: Self-pay | Attending: Emergency Medicine | Admitting: Emergency Medicine

## 2018-03-19 ENCOUNTER — Other Ambulatory Visit: Payer: Self-pay

## 2018-03-19 ENCOUNTER — Emergency Department (HOSPITAL_BASED_OUTPATIENT_CLINIC_OR_DEPARTMENT_OTHER): Payer: Self-pay

## 2018-03-19 ENCOUNTER — Encounter (HOSPITAL_BASED_OUTPATIENT_CLINIC_OR_DEPARTMENT_OTHER): Payer: Self-pay | Admitting: Emergency Medicine

## 2018-03-19 DIAGNOSIS — J45909 Unspecified asthma, uncomplicated: Secondary | ICD-10-CM | POA: Insufficient documentation

## 2018-03-19 DIAGNOSIS — Z91040 Latex allergy status: Secondary | ICD-10-CM | POA: Insufficient documentation

## 2018-03-19 DIAGNOSIS — Z87891 Personal history of nicotine dependence: Secondary | ICD-10-CM | POA: Insufficient documentation

## 2018-03-19 DIAGNOSIS — M545 Low back pain, unspecified: Secondary | ICD-10-CM

## 2018-03-19 DIAGNOSIS — Y9259 Other trade areas as the place of occurrence of the external cause: Secondary | ICD-10-CM | POA: Insufficient documentation

## 2018-03-19 DIAGNOSIS — M25552 Pain in left hip: Secondary | ICD-10-CM | POA: Insufficient documentation

## 2018-03-19 DIAGNOSIS — M25562 Pain in left knee: Secondary | ICD-10-CM | POA: Insufficient documentation

## 2018-03-19 DIAGNOSIS — W19XXXA Unspecified fall, initial encounter: Secondary | ICD-10-CM

## 2018-03-19 DIAGNOSIS — B20 Human immunodeficiency virus [HIV] disease: Secondary | ICD-10-CM | POA: Insufficient documentation

## 2018-03-19 DIAGNOSIS — Z79899 Other long term (current) drug therapy: Secondary | ICD-10-CM | POA: Insufficient documentation

## 2018-03-19 DIAGNOSIS — Y9301 Activity, walking, marching and hiking: Secondary | ICD-10-CM | POA: Insufficient documentation

## 2018-03-19 DIAGNOSIS — W010XXA Fall on same level from slipping, tripping and stumbling without subsequent striking against object, initial encounter: Secondary | ICD-10-CM | POA: Insufficient documentation

## 2018-03-19 DIAGNOSIS — Y998 Other external cause status: Secondary | ICD-10-CM | POA: Insufficient documentation

## 2018-03-19 LAB — PREGNANCY, URINE: PREG TEST UR: NEGATIVE

## 2018-03-19 MED ORDER — METHOCARBAMOL 500 MG PO TABS: 500.0000 mg | ORAL_TABLET | Freq: Two times a day (BID) | ORAL | 0 refills | Status: DC

## 2018-03-19 MED ORDER — KETOROLAC TROMETHAMINE 60 MG/2ML IM SOLN
60.0000 mg | Freq: Once | INTRAMUSCULAR | Status: AC
  Administered 2018-03-19: 60 mg via INTRAMUSCULAR
  Filled 2018-03-19: qty 2

## 2018-03-19 MED ORDER — ACETAMINOPHEN 325 MG PO TABS
650.0000 mg | ORAL_TABLET | Freq: Once | ORAL | Status: AC
  Administered 2018-03-19: 650 mg via ORAL
  Filled 2018-03-19: qty 2

## 2018-03-19 MED ORDER — LIDOCAINE 5 % EX PTCH: 1.0000 | MEDICATED_PATCH | CUTANEOUS | 0 refills | Status: DC
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
## 2018-03-19 NOTE — ED Notes (Signed)
Pt understood dc material. NAD Noted. Scripts given at dc. 

## 2018-03-19 NOTE — Discharge Instructions (Signed)
Expect your soreness to increase over the next 2-3 days. Take it easy, but do not lay around too much as this may make any stiffness worse.  °Antiinflammatory medications: Take 600 mg of ibuprofen every 6 hours or 440 mg (over the counter dose) to 500 mg (prescription dose) of naproxen every 12 hours for the next 3 days. After this time, these medications may be used as needed for pain. Take these medications with food to avoid upset stomach. Choose only one of these medications, do not take them together. °Acetaminophen (generic for Tylenol): Should you continue to have additional pain while taking the ibuprofen or naproxen, you may add in acetaminophen as needed. Your daily total maximum amount of acetaminophen from all sources should be limited to 4000mg/day for persons without liver problems, or 2000mg/day for those with liver problems. °Muscle relaxer: Robaxin is a muscle relaxer and may help loosen stiff muscles. Do not take the Robaxin while driving or performing other dangerous activities.  °Lidocaine patches: These are available via either prescription or over-the-counter. The over-the-counter option may be more economical one and are likely just as effective. There are multiple over-the-counter brands, such as Salonpas. °Exercises: Be sure to perform the attached exercises starting with three times a week and working up to performing them daily. This is an essential part of preventing long term problems.  °Follow up: Follow up with a primary care provider for any future management of these complaints. Be sure to follow up within 7-10 days. °Return: Return to the ED should symptoms worsen. ° °For prescription assistance, may try using prescription discount sites or apps, such as goodrx.com °

## 2018-03-19 NOTE — ED Triage Notes (Signed)
Per EMS, slip and fall at grocery store. Pt got up walked out to her car then realized she could not get into her car. Pt walked back into store asking for them to call EMS. Pt c/o left leg pain that radiates up to low back. EMS VS 124/86, 70, 18.

## 2018-03-19 NOTE — ED Notes (Signed)
Pt was sleeping when RN entered room to discharge patient. NAD noted

## 2018-03-19 NOTE — ED Provider Notes (Signed)
Care assumed at shift change from [facility], pending xray results. See his note for full HPI and workup. Briefly, pt slipped and fell today. Pain to back and left leg. NV intact.  Plan to Follow up imaging. If neg, discharge with robaxin and lidoderm patch. Follow up PCP. Physical Exam  BP (!) 115/51 (BP Location: Left Arm)   Pulse 62   Temp 98.2 F (36.8 C) (Oral)   Resp 18   LMP 03/19/2018   SpO2 99%   Physical Exam  Constitutional: She appears well-developed and well-nourished. No distress.  HENT:  Head: Normocephalic and atraumatic.  Eyes: Conjunctivae are normal.  Cardiovascular: Normal rate.  Pulmonary/Chest: Effort normal.  Psychiatric: She has a normal mood and affect. Her behavior is normal.  Nursing note and vitals reviewed.   ED Course/Procedures   Clinical Course as of Mar 19 2138  Wynelle Link Mar 19, 2018  1958 Patient states she feels much better.  Her pain has decreased significantly.   [SJ]    Clinical Course User Index [SJ] Joy, Shawn C, [name] [name]    Procedures  MDM  Xrays neg. Pt reports improvement in sx. Discussed discharge instructions. Follow up with PCP. Safe for discharge.       [name] [name], [name] [name], [name] [name] 03/19/18 2141    Gwyneth Sprout, MD 03/21/18 [phone]

## 2018-03-19 NOTE — ED Notes (Signed)
wtg on u-preg results in order to proceed w/ imaging

## 2018-03-19 NOTE — ED Notes (Signed)
ED Provider at bedside. 

## 2018-06-01 ENCOUNTER — Other Ambulatory Visit: Payer: Medicaid Other

## 2018-06-01 ENCOUNTER — Other Ambulatory Visit: Payer: Self-pay | Admitting: *Deleted

## 2018-06-01 ENCOUNTER — Other Ambulatory Visit (HOSPITAL_COMMUNITY)
Admission: RE | Admit: 2018-06-01 | Discharge: 2018-06-01 | Disposition: A | Discharge status: Home / Self Care | Payer: Medicaid Other | Source: Ambulatory Visit | Attending: Internal Medicine | Admitting: Internal Medicine

## 2018-06-01 DIAGNOSIS — B20 Human immunodeficiency virus [HIV] disease: Secondary | ICD-10-CM

## 2018-06-01 DIAGNOSIS — Z79899 Other long term (current) drug therapy: Secondary | ICD-10-CM

## 2018-06-01 DIAGNOSIS — Z113 Encounter for screening for infections with a predominantly sexual mode of transmission: Secondary | ICD-10-CM | POA: Insufficient documentation

## 2018-06-02 LAB — URINE CYTOLOGY ANCILLARY ONLY
Chlamydia: NEGATIVE
NEISSERIA GONORRHEA: NEGATIVE

## 2018-06-02 LAB — T-HELPER CELL (CD4) - (RCID CLINIC ONLY)
CD4 T CELL ABS: 890 /uL (ref 400–2700)
CD4 T CELL HELPER: 29 % — AB (ref 33–55)

## 2018-06-03 LAB — CBC WITH DIFFERENTIAL/PLATELET
Absolute Monocytes: 570 cells/uL (ref 200–950)
BASOS ABS: 46 {cells}/uL (ref 0–200)
Basophils Relative: 0.6 %
EOS PCT: 1.7 %
Eosinophils Absolute: 131 cells/uL (ref 15–500)
HEMATOCRIT: 30 % — AB (ref 35.0–45.0)
Hemoglobin: 9.7 g/dL — ABNORMAL LOW (ref 11.7–15.5)
LYMPHS ABS: 3003 {cells}/uL (ref 850–3900)
MCH: 24.4 pg — AB (ref 27.0–33.0)
MCHC: 32.3 g/dL (ref 32.0–36.0)
MCV: 75.4 fL — ABNORMAL LOW (ref 80.0–100.0)
MPV: 10.2 fL (ref 7.5–12.5)
Monocytes Relative: 7.4 %
Neutro Abs: 3950 cells/uL (ref 1500–7800)
Neutrophils Relative %: 51.3 %
Platelets: 348 10*3/uL (ref 140–400)
RBC: 3.98 10*6/uL (ref 3.80–5.10)
RDW: 19.6 % — AB (ref 11.0–15.0)
Total Lymphocyte: 39 %
WBC: 7.7 10*3/uL (ref 3.8–10.8)

## 2018-06-03 LAB — LIPID PANEL
CHOL/HDL RATIO: 3.2 (calc) (ref <5.0)
Cholesterol: 169 mg/dL (ref <200)
HDL: 53 mg/dL (ref >50)
LDL CHOLESTEROL (CALC): 98 mg/dL
NON-HDL CHOLESTEROL (CALC): 116 mg/dL (ref <130)
TRIGLYCERIDES: 87 mg/dL (ref <150)

## 2018-06-03 LAB — COMPLETE METABOLIC PANEL WITH GFR
AG RATIO: 1.2 (calc) (ref 1.0–2.5)
ALKALINE PHOSPHATASE (APISO): 97 U/L (ref 33–115)
ALT: 9 U/L (ref 6–29)
AST: 14 U/L (ref 10–35)
Albumin: 3.8 g/dL (ref 3.6–5.1)
BILIRUBIN TOTAL: 0.2 mg/dL (ref 0.2–1.2)
BUN: 10 mg/dL (ref 7–25)
CO2: 25 mmol/L (ref 20–32)
CREATININE: 0.53 mg/dL (ref 0.50–1.10)
Calcium: 8.7 mg/dL (ref 8.6–10.2)
Chloride: 104 mmol/L (ref 98–110)
GFR, Est African American: 129 mL/min/{1.73_m2} (ref >=60)
GFR, Est Non African American: 111 mL/min/{1.73_m2} (ref >=60)
GLOBULIN: 3.1 g/dL (ref 1.9–3.7)
Glucose, Bld: 86 mg/dL (ref 65–99)
POTASSIUM: 4 mmol/L (ref 3.5–5.3)
SODIUM: 137 mmol/L (ref 135–146)
Total Protein: 6.9 g/dL (ref 6.1–8.1)

## 2018-06-03 LAB — HIV-1 RNA QUANT-NO REFLEX-BLD
HIV 1 RNA Quant: <20 copies/mL
HIV-1 RNA Quant, Log: <1.3 Log copies/mL

## 2018-06-03 LAB — RPR: RPR Ser Ql: NONREACTIVE

## 2018-06-13 ENCOUNTER — Other Ambulatory Visit: Payer: Self-pay

## 2018-06-13 ENCOUNTER — Ambulatory Visit: Payer: Medicaid Other | Admitting: Infectious Diseases

## 2018-06-13 ENCOUNTER — Encounter: Payer: Self-pay | Admitting: Internal Medicine

## 2018-06-13 ENCOUNTER — Ambulatory Visit (INDEPENDENT_AMBULATORY_CARE_PROVIDER_SITE_OTHER): Payer: Medicaid Other | Admitting: Infectious Diseases

## 2018-06-13 VITALS — BP 121/79 | HR 68 | Temp 98.5°F | Wt 240.0 lb

## 2018-06-13 DIAGNOSIS — Z21 Asymptomatic human immunodeficiency virus [HIV] infection status: Secondary | ICD-10-CM

## 2018-06-13 DIAGNOSIS — Z91040 Latex allergy status: Secondary | ICD-10-CM

## 2018-06-13 DIAGNOSIS — N921 Excessive and frequent menstruation with irregular cycle: Secondary | ICD-10-CM

## 2018-06-13 DIAGNOSIS — Z888 Allergy status to other drugs, medicaments and biological substances status: Secondary | ICD-10-CM

## 2018-06-13 DIAGNOSIS — Z885 Allergy status to narcotic agent status: Secondary | ICD-10-CM

## 2018-06-13 DIAGNOSIS — Z79899 Other long term (current) drug therapy: Secondary | ICD-10-CM

## 2018-06-13 DIAGNOSIS — Z9114 Patient's other noncompliance with medication regimen: Secondary | ICD-10-CM

## 2018-06-13 DIAGNOSIS — Z887 Allergy status to serum and vaccine status: Secondary | ICD-10-CM

## 2018-06-13 DIAGNOSIS — Z87891 Personal history of nicotine dependence: Secondary | ICD-10-CM

## 2018-06-13 DIAGNOSIS — N920 Excessive and frequent menstruation with regular cycle: Secondary | ICD-10-CM

## 2018-06-13 DIAGNOSIS — Z2821 Immunization not carried out because of patient refusal: Secondary | ICD-10-CM

## 2018-06-13 MED ORDER — BICTEGRAVIR-EMTRICITAB-TENOFOV 50-200-25 MG PO TABS: 1.0000 | ORAL_TABLET | Freq: Every day | ORAL | 11 refills | Status: DC

## 2018-06-13 NOTE — Assessment & Plan Note (Signed)
She has had long-standing and well controlled HIV disease. Likes the one pill once a day option. She has had some challenges with adherence - counseled today regarding risk for resistance and inability to treat infection with poor adherence. She shares these concerns. Discussed labs today and reviewed practices to help with adherence.  She will follow up with Dr. Drue Second again in 6 months.  Lab orders entered for VL/CD4

## 2018-06-13 NOTE — Patient Instructions (Signed)
Please continue your Biktarvy once a day every day.   Tips for Successful Daily Medication Habits: 1. Set a reminder on your phone  2. Try filling out a pill box for the week - pick a day and put one pill for every day during the week so you know right away if you missed a pill.  3. Have a trusted family member ask you about your medications.  4. Smartphone app   Remember every pill is precious and we want to be able to continue treating your with confidence the rest of your life.   Will have you return in 6 months for repeat labs and ensure you are continuing to do well.   Please schedule a pap smear with Judeth Cornfield here at the clinic at your earliest convenience.

## 2018-06-13 NOTE — Telephone Encounter (Signed)
Patient requested refill of Biktarvy. She reports not taking medication consistently and is concerned about her lab values.   [facility].   Laurell Josephsammy K Talley Kreiser, RN
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
## 2018-06-13 NOTE — Assessment & Plan Note (Addendum)
Long standing history - controlled currently but intermittently waxes and wanes. I asked her to please schedule an appointment for a pap smear with me at her convenience this year. Discuss recommended interval for women living with HIV.  Check TSH and CBC at next visit. Discussed she may start seeing her menstrual cycles spacing out as she is approaching average/typical age of onset of menopause/periomenopause.

## 2018-07-03 ENCOUNTER — Encounter: Payer: Self-pay | Admitting: Infectious Diseases

## 2018-12-30 ENCOUNTER — Encounter (HOSPITAL_BASED_OUTPATIENT_CLINIC_OR_DEPARTMENT_OTHER): Payer: Self-pay | Admitting: *Deleted

## 2018-12-30 ENCOUNTER — Emergency Department (HOSPITAL_BASED_OUTPATIENT_CLINIC_OR_DEPARTMENT_OTHER)
Admission: EM | Admit: 2018-12-30 | Discharge: 2018-12-30 | Disposition: A | Discharge status: Home / Self Care | Payer: BLUE CROSS/BLUE SHIELD | Attending: Emergency Medicine | Admitting: Emergency Medicine

## 2018-12-30 ENCOUNTER — Other Ambulatory Visit: Payer: Self-pay

## 2018-12-30 ENCOUNTER — Emergency Department (HOSPITAL_BASED_OUTPATIENT_CLINIC_OR_DEPARTMENT_OTHER): Payer: BLUE CROSS/BLUE SHIELD

## 2018-12-30 DIAGNOSIS — Z79899 Other long term (current) drug therapy: Secondary | ICD-10-CM | POA: Insufficient documentation

## 2018-12-30 DIAGNOSIS — Z87891 Personal history of nicotine dependence: Secondary | ICD-10-CM | POA: Insufficient documentation

## 2018-12-30 DIAGNOSIS — R0602 Shortness of breath: Secondary | ICD-10-CM | POA: Insufficient documentation

## 2018-12-30 DIAGNOSIS — Z20828 Contact with and (suspected) exposure to other viral communicable diseases: Secondary | ICD-10-CM | POA: Diagnosis not present

## 2018-12-30 DIAGNOSIS — J45909 Unspecified asthma, uncomplicated: Secondary | ICD-10-CM | POA: Insufficient documentation

## 2018-12-30 DIAGNOSIS — Z20822 Contact with and (suspected) exposure to covid-19: Secondary | ICD-10-CM

## 2018-12-30 DIAGNOSIS — Z21 Asymptomatic human immunodeficiency virus [HIV] infection status: Secondary | ICD-10-CM | POA: Diagnosis not present

## 2018-12-30 MED ORDER — BENZONATATE 100 MG PO CAPS: 100.0000 mg | ORAL_CAPSULE | Freq: Three times a day (TID) | ORAL | 0 refills | Status: DC

## 2018-12-30 MED ORDER — AEROCHAMBER PLUS FLO-VU MISC
1.0000 | Freq: Once | Status: AC
  Administered 2018-12-30: 1
  Filled 2018-12-30: qty 1

## 2018-12-30 MED ORDER — ALBUTEROL SULFATE HFA 108 (90 BASE) MCG/ACT IN AERS
2.0000 | INHALATION_SPRAY | Freq: Once | RESPIRATORY_TRACT | Status: AC
  Administered 2018-12-30: 2 via RESPIRATORY_TRACT
  Filled 2018-12-30: qty 6.7
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
## 2018-12-30 NOTE — ED Notes (Signed)
Portable Xray at bedside.

## 2018-12-30 NOTE — Discharge Instructions (Signed)
Use your inhaler every 4 hours(2 puffs) while awake, return for sudden worsening shortness of breath, or if you need to use your inhaler more often.   Your cxr was normal.      Person Under Monitoring Name: [name] [name]  Location: [address] [address]   Infection Prevention Recommendations for Individuals Confirmed to have, or Being Evaluated for, 2019 Novel Coronavirus (COVID-19) Infection Who Receive Care at Home  Individuals who are confirmed to have, or are being evaluated for, COVID-19 should follow the prevention steps below until a healthcare provider or local or state health department says they can return to normal activities.  Stay home except to get medical care You should restrict activities outside your home, except for getting medical care. Do not go to work, school, or public areas, and do not use public transportation or taxis.  Call ahead before visiting your doctor Before your medical appointment, call the healthcare provider and tell them that you have, or are being evaluated for, COVID-19 infection. This will help the healthcare providers office take steps to keep other people from getting infected. Ask your healthcare provider to call the local or state health department.  Monitor your symptoms Seek prompt medical attention if your illness is worsening (e.g., difficulty breathing). Before going to your medical appointment, call the healthcare provider and tell them that you have, or are being evaluated for, COVID-19 infection. Ask your healthcare provider to call the local or state health department.  Wear a facemask You should wear a facemask that covers your nose and mouth when you are in the same room with other people and when you visit a healthcare provider. People who live with or visit you should also wear a facemask while they are in the same room with you.  Separate yourself from other people in your home As much as  possible, you should stay in a different room from other people in your home. Also, you should use a separate bathroom, if available.  Avoid sharing household items You should not share dishes, drinking glasses, cups, eating utensils, towels, bedding, or other items with other people in your home. After using these items, you should wash them thoroughly with soap and water.  Cover your coughs and sneezes Cover your mouth and nose with a tissue when you cough or sneeze, or you can cough or sneeze into your sleeve. Throw used tissues in a lined trash can, and immediately wash your hands with soap and water for at least 20 seconds or use an alcohol-based hand rub.  Wash your [facility] your hands often and thoroughly with soap and water for at least 20 seconds. You can use an alcohol-based hand sanitizer if soap and water are not available and if your hands are not visibly dirty. Avoid touching your eyes, nose, and mouth with unwashed hands.   Prevention Steps for Caregivers and Household Members of Individuals Confirmed to have, or Being Evaluated for, COVID-19 Infection Being Cared for in the Home  If you live with, or provide care at home for, a person confirmed to have, or being evaluated for, COVID-19 infection please follow these guidelines to prevent infection:  Follow healthcare providers instructions Make sure that you understand and can help the patient follow any healthcare provider instructions for all care.  Provide for the patients basic needs You should help the patient with basic needs in the home and provide support for getting groceries, prescriptions, and other personal needs.  Monitor the patients symptoms If they are getting sicker, call his or her medical provider and tell them that the patient has, or is being evaluated for, COVID-19 infection. This will help the healthcare providers office take steps to keep other people from getting infected. Ask the  healthcare provider to call the local or state health department.  Limit the number of people who have contact with the patient If possible, have only one caregiver for the patient. Other household members should stay in another home or place of residence. If this is not possible, they should stay in another room, or be separated from the patient as much as possible. Use a separate bathroom, if available. Restrict visitors who do not have an essential need to be in the home.  Keep older adults, very young children, and other sick people away from the patient Keep older adults, very young children, and those who have compromised immune systems or chronic health conditions away from the patient. This includes people with chronic heart, lung, or kidney conditions, diabetes, and cancer.  Ensure good ventilation Make sure that shared spaces in the home have good air flow, such as from an air conditioner or an opened window, weather permitting.  Wash your hands often Wash your hands often and thoroughly with soap and water for at least 20 seconds. You can use an alcohol based hand sanitizer if soap and water are not available and if your hands are not visibly dirty. Avoid touching your eyes, nose, and mouth with unwashed hands. Use disposable paper towels to dry your hands. If not available, use dedicated cloth towels and replace them when they become wet.  Wear a facemask and gloves Wear a disposable facemask at all times in the room and gloves when you touch or have contact with the patients blood, body fluids, and/or secretions or excretions, such as sweat, saliva, sputum, nasal mucus, vomit, urine, or feces.  Ensure the mask fits over your nose and mouth tightly, and do not touch it during use. Throw out disposable facemasks and gloves after using them. Do not reuse. Wash your hands immediately after removing your facemask and gloves. If your personal clothing becomes contaminated, carefully  remove clothing and launder. Wash your hands after handling contaminated clothing. Place all used disposable facemasks, gloves, and other waste in a lined container before disposing them with other household waste. Remove gloves and wash your hands immediately after handling these items.  Do not share dishes, glasses, or other household items with the patient Avoid sharing household items. You should not share dishes, drinking glasses, cups, eating utensils, towels, bedding, or other items with a patient who is confirmed to have, or being evaluated for, COVID-19 infection. After the person uses these items, you should wash them thoroughly with soap and water.  Wash laundry thoroughly Immediately remove and wash clothes or bedding that have blood, body fluids, and/or secretions or excretions, such as sweat, saliva, sputum, nasal mucus, vomit, urine, or feces, on them. Wear gloves when handling laundry from the patient. Read and follow directions on labels of laundry or clothing items and detergent. In general, wash and dry with the warmest temperatures recommended on the label.  Clean all areas the individual has used often Clean all touchable surfaces, such as counters, tabletops, doorknobs, bathroom fixtures, toilets, phones, keyboards, tablets, and bedside tables, every day. Also, clean any surfaces that may have blood, body fluids, and/or secretions or excretions on them. Wear gloves when cleaning surfaces the patient has  come in contact with. Use a diluted bleach solution (e.g., dilute bleach with 1 part bleach and 10 parts water) or a household disinfectant with a label that says EPA-registered for coronaviruses. To make a bleach solution at home, add 1 tablespoon of bleach to 1 quart (4 cups) of water. For a larger supply, add  cup of bleach to 1 gallon (16 cups) of water. Read labels of cleaning products and follow recommendations provided on product labels. Labels contain instructions for  safe and effective use of the cleaning product including precautions you should take when applying the product, such as wearing gloves or eye protection and making sure you have good ventilation during use of the product. Remove gloves and wash hands immediately after cleaning.  Monitor yourself for signs and symptoms of illness Caregivers and household members are considered close contacts, should monitor their health, and will be asked to limit movement outside of the home to the extent possible. Follow the monitoring steps for close contacts listed on the symptom monitoring form.   ? If you have additional questions, contact your local health department or call the epidemiologist on call at [phone] (available 24/7). ? This guidance is subject to change. For the most up-to-date guidance from [facility], please refer to their website: YouBlogs.pl

## 2018-12-30 NOTE — ED Triage Notes (Signed)
Cough, headache, joint pain x 2 days. Possible covid exposure through a cowokrer

## 2019-01-01 ENCOUNTER — Telehealth: Payer: Self-pay | Admitting: Infectious Diseases

## 2019-01-01 LAB — NOVEL CORONAVIRUS, NAA (HOSP ORDER, SEND-OUT TO REF LAB; TAT 18-24 HRS): SARS-CoV-2, NAA: NOT DETECTED

## 2019-01-02 ENCOUNTER — Ambulatory Visit (INDEPENDENT_AMBULATORY_CARE_PROVIDER_SITE_OTHER): Payer: BLUE CROSS/BLUE SHIELD | Admitting: Infectious Diseases

## 2019-01-02 ENCOUNTER — Encounter: Payer: Self-pay | Admitting: Infectious Diseases

## 2019-01-02 ENCOUNTER — Other Ambulatory Visit: Payer: Self-pay

## 2019-01-02 DIAGNOSIS — N920 Excessive and frequent menstruation with regular cycle: Secondary | ICD-10-CM

## 2019-01-02 DIAGNOSIS — R202 Paresthesia of skin: Secondary | ICD-10-CM | POA: Diagnosis not present

## 2019-01-02 DIAGNOSIS — Z21 Asymptomatic human immunodeficiency virus [HIV] infection status: Secondary | ICD-10-CM

## 2019-01-02 DIAGNOSIS — R200 Anesthesia of skin: Secondary | ICD-10-CM | POA: Diagnosis not present

## 2019-01-02 NOTE — Assessment & Plan Note (Signed)
Differentials include iron deficiency (chronic blood loss r/t menses and microcytic anemia on CBC), vitamin b12 deficiency (eats very little animal proteins and mostly plant based foods), folate deficiency, hiv neuropathy, undiagnosed diabetes.  Will have her come next week for labs - this appointment was scheduled and details communicated for her.

## 2019-01-02 NOTE — Progress Notes (Signed)
Name: [name] [name]  DOB: [birth year] MRN: [id number] PCP: Christ Kick, PA    Virtual Visit via Telephone Note  I connected with [name] [name] on 01/02/19 at 10:45 AM EDT by telephone and verified that I am speaking with the correct person using two identifiers.   I discussed the limitations, risks, security and privacy concerns of performing an evaluation and management service by telephone and the availability of in person appointments. I also discussed with the patient that there may be a patient responsible charge related to this service. The patient expressed understanding and agreed to proceed.   Patient Active Problem List   Diagnosis Date Noted  . Numbness and tingling of both feet 01/02/2019  . Asthma 04/02/2015  . Smoker 04/02/2015  . Human immunodeficiency virus (HIV) infection (HCC) 10/16/2014  . Moderate episode of recurrent major depressive disorder (HCC) 10/16/2014  . Depression 03/08/2014  . Menorrhagia 10/25/2011  . Obesity 05/25/2011  . HIV (human immunodeficiency virus infection) (HCC) 05/10/2011     Subjective:   CC: Pain and numbness/tingling in feet/lower extremities    HPI:  [name] [name] for the last year now has noticed burning/tingling pain in the toes and lower legs. She states that now this has progressively worsened since onset. She is concerned it is due to her HIV medication but symptom onset precedes this. She does not drink alcohol, continues to take her Biktarvy every day without missed doses. Menstrual bleeding is under good control after receiving a "natural homeopathic remedy from 'back home' that she is uncertain the name of" and now reports regular monthly cycles that are under much better control. She never really took the Megace much for this. Not currently taking iron but takes a multvitamin supplement. She does not eat much red meat or animal proteins in general - mostly gets nutrition from plant products.   These moments come and go  intermittently - massage, tylenol and time are modifying factors. She notices that when she drives for long distances this happens in the car and happens when she stands up. She does not describe this to be glass shards under her feet when she walks but very "uncomfortable and tight/locking up pain"  Recently sought testing for COVID-19 due to cough - has not received results yet but improved symptoms and in general "fearful over contracting the virus."   She has improved with taking her HIV medication since our last office visit in February of this year.     Review of Systems  Constitutional: Negative for chills, fever, malaise/fatigue and weight loss.  HENT: Negative for sore throat.        No dental problems  Respiratory: Positive for cough. Negative for sputum production.   Cardiovascular: Negative for chest pain and leg swelling.  Gastrointestinal: Negative for abdominal pain, diarrhea and vomiting.  Genitourinary: Negative for dysuria and flank pain.  Musculoskeletal: Positive for joint pain (ankle/toes/hips). Negative for myalgias and neck pain.  Skin: Negative for rash.  Neurological: Positive for tingling (numbness ). Negative for dizziness and headaches.    Past Medical History:  Diagnosis Date  . Anemia   . Anxiety   . Asthma 04/02/2015  . 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 Depression   . Flu-like symptoms 04/02/2015  . HIV (human immunodeficiency virus infection) (HCC)   . Preterm labor   . Smoker 04/02/2015    Outpatient Medications Prior to Visit  Medication Sig Dispense Refill  . albuterol (PROVENTIL HFA;VENTOLIN HFA) 108 (  90 BASE) MCG/ACT inhaler Inhale 2 puffs into the lungs every 6 (six) hours as needed for wheezing or shortness of breath. 1 Inhaler 6  . benzonatate (TESSALON) 100 MG capsule Take 1 capsule (100 mg total) by mouth every 8 (eight) hours. 21 capsule 0  . bictegravir-emtricitabine-tenofovir AF (BIKTARVY) 50-200-25 MG TABS  tablet Take 1 tablet by mouth daily. 30 tablet 11  . IRON PO Take 1 tablet by mouth every morning.    . lidocaine (LIDODERM) 5 % Place 1 patch onto the skin daily. Remove & Discard patch within 12 hours or as directed by MD 30 patch 0  . Multiple Vitamin (MULTIVITAMIN) tablet Take 1 tablet by mouth daily.    Marland Kitchen Spacer/Aero-Holding Chambers (AEROCHAMBER PLUS) inhaler Use as instructed 1 each 2  . megestrol (MEGACE) 40 MG tablet Take two tablets (80 mg) twice per day for 3 days then take one tablet (40 mg) twice per day every day If bleeding stops, you may drop to one table (40 mg) daily but return to twice daily if bleeding returns 68 tablet 5  . megestrol (MEGACE) 40 MG tablet Take 1 tablet (40 mg total) by mouth 3 (three) times daily. 90 tablet 0  . ibuprofen (ADVIL,MOTRIN) 600 MG tablet Take 1 tablet (600 mg total) by mouth every 6 (six) hours as needed for fever or headache. (Patient not taking: Reported on 01/02/2019) 30 tablet 0  . methocarbamol (ROBAXIN) 500 MG tablet Take 1 tablet (500 mg total) by mouth 2 (two) times daily. (Patient not taking: Reported on 01/02/2019) 20 tablet 0   No facility-administered medications prior to visit.      Allergies  Allergen Reactions  . Nortriptyline Other (See Comments) and Nausea And Vomiting  . Vicodin [Hydrocodone-Acetaminophen] Nausea Only  . Influenza Vaccine Live Other (See Comments)    Claims to have had very bad congestion and headaches for 7 days post each of her last flu shots in 2011 and 2002 REFUSES  . Latex Rash    Social History   Tobacco Use  . Smoking status: Former Smoker    Packs/day: 0.50    Types: Cigarettes  . Smokeless tobacco: Never Used  Substance Use Topics  . Alcohol use: No    Alcohol/week: 0.0 standard drinks  . Drug use: No    Family History  Problem Relation Age of Onset  . Other Neg Hx     Social History   Substance and Sexual Activity  Sexual Activity Yes  . Birth control/protection: None      Objective:   There were no vitals filed for this visit. There is no height or weight on file to calculate BMI.  Physical Exam Pulmonary:     Effort: Pulmonary effort is normal.  Neurological:     Mental Status: She is alert and oriented to person, place, and time.  Psychiatric:        Mood and Affect: Mood normal.        Judgment: Judgment normal.     Lab Results Lab Results  Component Value Date   WBC 7.7 06/01/2018   HGB 9.7 (L) 06/01/2018   HCT 30.0 (L) 06/01/2018   MCV 75.4 (L) 06/01/2018   PLT 348 06/01/2018    Lab Results  Component Value Date   CREATININE 0.53 06/01/2018   BUN 10 06/01/2018   NA 137 06/01/2018   K 4.0 06/01/2018   CL 104 06/01/2018   CO2 25 06/01/2018    Lab Results  Component Value  Date   ALT 9 06/01/2018   AST 14 06/01/2018   ALKPHOS 74 01/19/2017   BILITOT 0.2 06/01/2018    Lab Results  Component Value Date   CHOL 169 06/01/2018   HDL 53 06/01/2018   LDLCALC 98 06/01/2018   TRIG 87 06/01/2018   CHOLHDL 3.2 06/01/2018   HIV 1 RNA Quant (copies/mL)  Date Value  06/01/2018 <20 NOT DETECTED  11/14/2017 <20 NOT DETECTED  07/05/2017 <20 NOT DETECTED   CD4 T Cell Abs (/uL)  Date Value  06/01/2018 890  11/14/2017 840  07/05/2017 830     Assessment & Plan:   Problem List Items Addressed This Visit      Unprioritized   Menorrhagia    She reports that this has regulated following herbal remedy and now with normal regular cycles. Will reassess hgb and TSH and encouraged her to have pap smear updated.       Human immunodeficiency virus (HIV) infection (HCC)   Relevant Orders   HIV-1 RNA quant-no reflex-bld   CBC   T-helper cell (CD4)- ([facility] clinic only)   Numbness and tingling of both feet - Primary    Differentials include iron deficiency (chronic blood loss r/t menses and microcytic anemia on CBC), vitamin b12 deficiency (eats very little animal proteins and mostly plant based foods), folate deficiency, hiv neuropathy,  undiagnosed diabetes.  Will have her come next week for labs - this appointment was scheduled and details communicated for her.       Relevant Orders   Iron, TIBC and Ferritin Panel   B12 and Folate Panel   Vitamin D 1,25 dihydroxy   Hemoglobin A1c     Follow Up Instructions: Lab appt next Tuesday AM with flu shot. Further treatment TBD based on lab findings.    I discussed the assessment and treatment plan with the patient. The patient was provided an opportunity to ask questions and all were answered. The patient agreed with the plan and demonstrated an understanding of the instructions.   The patient was advised to call back or seek an in-person evaluation if the symptoms worsen or if the condition fails to improve as anticipated.  I provided 11 minutes of non-face-to-face time during this encounter.   Rexene AlbertsStephanie Ulysse Siemen, MSN, NP-C [facility]  [email or web address] Pager: [phone] Office: [phone] [facility] Main Line: [phone]

## 2019-01-02 NOTE — Assessment & Plan Note (Addendum)
Will reassess VL/CD4 at upcoming lab draw to assess adherence. She had some situational stressors that interfered with her adherence last time I spoke with her. She will get her flu shot at upcoming lab visit also.   HIV 1 RNA Quant (copies/mL)  Date Value  06/01/2018 <20 NOT DETECTED  11/14/2017 <20 NOT DETECTED  07/05/2017 <20 NOT DETECTED   CD4 T Cell Abs (/uL)  Date Value  06/01/2018 890  11/14/2017 840  07/05/2017 830

## 2019-01-02 NOTE — Assessment & Plan Note (Signed)
She reports that this has regulated following herbal remedy and now with normal regular cycles. Will reassess hgb and TSH and encouraged her to have pap smear updated.

## 2019-01-09 ENCOUNTER — Other Ambulatory Visit: Payer: Self-pay

## 2019-01-09 ENCOUNTER — Other Ambulatory Visit: Payer: BLUE CROSS/BLUE SHIELD

## 2019-01-09 DIAGNOSIS — N921 Excessive and frequent menstruation with irregular cycle: Secondary | ICD-10-CM

## 2019-01-09 DIAGNOSIS — R200 Anesthesia of skin: Secondary | ICD-10-CM

## 2019-01-09 DIAGNOSIS — Z21 Asymptomatic human immunodeficiency virus [HIV] infection status: Secondary | ICD-10-CM

## 2019-01-09 DIAGNOSIS — R202 Paresthesia of skin: Secondary | ICD-10-CM

## 2019-01-10 ENCOUNTER — Other Ambulatory Visit: Payer: Medicaid Other

## 2019-01-10 LAB — T-HELPER CELL (CD4) - (RCID CLINIC ONLY)
CD4 % Helper T Cell: 41 % (ref 33–65)
CD4 T Cell Abs: 960 /uL (ref 400–1790)

## 2019-01-13 LAB — CBC
HCT: 32.7 % — ABNORMAL LOW (ref 35.0–45.0)
Hemoglobin: 10.3 g/dL — ABNORMAL LOW (ref 11.7–15.5)
MCH: 24.9 pg — ABNORMAL LOW (ref 27.0–33.0)
MCHC: 31.5 g/dL — ABNORMAL LOW (ref 32.0–36.0)
MCV: 79.2 fL — ABNORMAL LOW (ref 80.0–100.0)
MPV: 10.5 fL (ref 7.5–12.5)
Platelets: 319 10*3/uL (ref 140–400)
RBC: 4.13 10*6/uL (ref 3.80–5.10)
RDW: 19 % — ABNORMAL HIGH (ref 11.0–15.0)
WBC: 5.7 10*3/uL (ref 3.8–10.8)

## 2019-01-13 LAB — VITAMIN D 1,25 DIHYDROXY
Vitamin D 1, 25 (OH)2 Total: 51 pg/mL (ref 18–72)
Vitamin D2 1, 25 (OH)2: <8 pg/mL
Vitamin D3 1, 25 (OH)2: 51 pg/mL

## 2019-01-13 LAB — B12 AND FOLATE PANEL
Folate: 14.5 ng/mL
Vitamin B-12: 551 pg/mL (ref 200–1100)

## 2019-01-13 LAB — HEMOGLOBIN A1C
Hgb A1c MFr Bld: 5.7 % of total Hgb — ABNORMAL HIGH (ref <5.7)
Mean Plasma Glucose: 117 (calc)
eAG (mmol/L): 6.5 (calc)

## 2019-01-13 LAB — HIV-1 RNA QUANT-NO REFLEX-BLD
HIV 1 RNA Quant: <20 copies/mL
HIV-1 RNA Quant, Log: <1.3 Log copies/mL

## 2019-01-13 LAB — TSH: TSH: 1.48 mIU/L

## 2019-01-13 LAB — IRON,TIBC AND FERRITIN PANEL
%SAT: 6 % (calc) — ABNORMAL LOW (ref 16–45)
Ferritin: 3 ng/mL — ABNORMAL LOW (ref 16–232)
Iron: 20 ug/dL — ABNORMAL LOW (ref 40–190)
TIBC: 341 mcg/dL (calc) (ref 250–450)

## 2019-01-16 ENCOUNTER — Telehealth: Payer: Self-pay | Admitting: *Deleted

## 2019-01-16 ENCOUNTER — Encounter: Payer: Self-pay | Admitting: Infectious Diseases

## 2019-01-16 NOTE — Telephone Encounter (Signed)
RN received result note to relay to patient.  Also, patient left message with her [facility] case anager Almyra Free today asking for an appointment to discuss joint pain. RN called patient to relay results, left message asking her to call back and asked patient to consider scheduling with her primary care physician for joint pain. Landis Gandy, RN   [place] Callas, NP  P [facility] Triage Nurse Pool        Please call Wynonna regarding the following:   Based on her blood work I believe her symptoms we discussed are due to iron deficiency. This is also causing her blood counts to be low (anemia). I would like for her to start taking iron once daily. Side effects can be upset stomach when taken on an empty stomach and constipation. She can take over the counter stool softeners should this develop. Please have her take the iron 4 hours apart from her biktarvy - they should not be taken in the stomach at the same time. I will send in a prescription to her pharmacy for this.  Please have her make a follow up appointment in 2 months.   She also is in the "pre-diabetic" range. Please help her set up her MyChart so I can send her resources on what to do next to prevent diabetes with diet changes.  Her HIV viral load is undetectable, CD4 count is perfect @ 960, Vitamin levels are normal, Thyroid function is normal.    If she prefer we can arrange a formal televisit to discuss further.

## 2019-01-16 NOTE — Progress Notes (Signed)
Please call [name] [name] regarding the following:   Based on her blood work I believe her symptoms we discussed are due to iron deficiency. This is also causing her blood counts to be low (anemia). I would like for her to start taking iron once daily. Side effects can be upset stomach when taken on an empty stomach and constipation. She can take over the counter stool softeners should this develop. Please have her take the iron 4 hours apart from her biktarvy - they should not be taken in the stomach at the same time. I will send in a prescription to her pharmacy for this.  Please have her make a follow up appointment in 2 months.   She also is in the "pre-diabetic" range. Please help her set up her MyChart so I can send her resources on what to do next to prevent diabetes with diet changes.  Her HIV viral load is undetectable, CD4 count is perfect @ 960, Vitamin levels are normal, Thyroid function is normal.   If she prefer we can arrange a formal televisit to discuss further.

## 2019-01-16 NOTE — Telephone Encounter (Signed)
Thank you. Her complaint to me during telephone visit was more a muscle cramping / neuropathy pain not joint.  Agree with working closely with PCP to help with that evaluation.

## 2019-01-16 NOTE — Telephone Encounter (Signed)
-----   Message from [place] Callas, NP sent at 01/16/2019  8:33 AM EDT ----- Please call Gaile regarding the following:   Based on her blood work I believe her symptoms we discussed are due to iron deficiency. This is also causing her blood counts to be low (anemia). I would like for her to start taking iron once daily. Side effects can be upset stomach when taken on an empty stomach and constipation. She can take over the counter stool softeners should this develop. Please have her take the iron 4 hours apart from her biktarvy - they should not be taken in the stomach at the same time. I will send in a prescription to her pharmacy for this.  Please have her make a follow up appointment in 2 months.   She also is in the "pre-diabetic" range. Please help her set up her MyChart so I can send her resources on what to do next to prevent diabetes with diet changes.  Her HIV viral load is undetectable, CD4 count is perfect @ 960, Vitamin levels are normal, Thyroid function is normal.    If she prefer we can arrange a formal televisit to discuss further.

## 2019-01-22 ENCOUNTER — Ambulatory Visit: Payer: Medicaid Other | Admitting: Internal Medicine

## 2019-02-20 ENCOUNTER — Other Ambulatory Visit: Payer: Self-pay

## 2019-02-20 ENCOUNTER — Telehealth: Payer: Self-pay | Admitting: *Deleted

## 2019-02-20 DIAGNOSIS — Z91040 Latex allergy status: Secondary | ICD-10-CM | POA: Diagnosis not present

## 2019-02-20 DIAGNOSIS — Z79899 Other long term (current) drug therapy: Secondary | ICD-10-CM | POA: Diagnosis not present

## 2019-02-20 DIAGNOSIS — Z87891 Personal history of nicotine dependence: Secondary | ICD-10-CM | POA: Diagnosis not present

## 2019-02-20 DIAGNOSIS — B20 Human immunodeficiency virus [HIV] disease: Secondary | ICD-10-CM | POA: Insufficient documentation

## 2019-02-20 DIAGNOSIS — R1030 Lower abdominal pain, unspecified: Secondary | ICD-10-CM | POA: Diagnosis present

## 2019-02-20 NOTE — Telephone Encounter (Signed)
Patient asked to speak with this RN.  She said she made an appointment to be seen here 10/15, but may not be able to keep it due to a court date.  She also has dysuria and pelvic pressure. She is worried she has a UTI.  RN encouraged patient to go to an Urgent Care for evaluation of this, not wait until 10/15.  She agreed. Landis Gandy, RN

## 2019-02-21 ENCOUNTER — Emergency Department (HOSPITAL_BASED_OUTPATIENT_CLINIC_OR_DEPARTMENT_OTHER)
Admission: EM | Admit: 2019-02-21 | Discharge: 2019-02-21 | Disposition: A | Discharge status: Home / Self Care | Payer: BLUE CROSS/BLUE SHIELD | Attending: Emergency Medicine | Admitting: Emergency Medicine

## 2019-02-21 ENCOUNTER — Other Ambulatory Visit: Payer: Self-pay

## 2019-02-21 ENCOUNTER — Encounter (HOSPITAL_BASED_OUTPATIENT_CLINIC_OR_DEPARTMENT_OTHER): Payer: Self-pay

## 2019-02-21 DIAGNOSIS — R1030 Lower abdominal pain, unspecified: Secondary | ICD-10-CM

## 2019-02-21 LAB — URINALYSIS, MICROSCOPIC (REFLEX)

## 2019-02-21 LAB — WET PREP, GENITAL
Sperm: NONE SEEN
Trich, Wet Prep: NONE SEEN
Yeast Wet Prep HPF POC: NONE SEEN

## 2019-02-21 LAB — URINALYSIS, ROUTINE W REFLEX MICROSCOPIC
Bilirubin Urine: NEGATIVE
Glucose, UA: NEGATIVE mg/dL
Ketones, ur: NEGATIVE mg/dL
Nitrite: NEGATIVE
Protein, ur: NEGATIVE mg/dL
Specific Gravity, Urine: <1.005 — ABNORMAL LOW (ref 1.005–1.030)
pH: 6.5 (ref 5.0–8.0)

## 2019-02-21 LAB — PREGNANCY, URINE: Preg Test, Ur: NEGATIVE

## 2019-02-21 MED ORDER — ONDANSETRON 8 MG PO TBDP
8.0000 mg | ORAL_TABLET | Freq: Once | ORAL | Status: AC
  Administered 2019-02-21: 01:00:00 8 mg via ORAL
  Filled 2019-02-21: qty 1

## 2019-02-21 MED ORDER — IBUPROFEN 400 MG PO TABS
400.0000 mg | ORAL_TABLET | Freq: Once | ORAL | Status: AC
  Administered 2019-02-21: 400 mg via ORAL
  Filled 2019-02-21: qty 1

## 2019-02-21 MED ORDER — OXYCODONE-ACETAMINOPHEN 5-325 MG PO TABS: 1.0000 | ORAL_TABLET | Freq: Once | ORAL | Status: DC

## 2019-02-21 MED ORDER — OXYCODONE-ACETAMINOPHEN 5-325 MG PO TABS: 1.0000 | ORAL_TABLET | Freq: Three times a day (TID) | ORAL | 0 refills | Status: DC | PRN

## 2019-02-21 NOTE — Discharge Instructions (Addendum)

## 2019-02-21 NOTE — ED Notes (Signed)
Pt drove self here to she is unable to take narcotic pain medicine. MD aware.

## 2019-02-21 NOTE — ED Triage Notes (Signed)
Pt reports lower abdominal pain/pelvic pain with urinary frequency x 3 days.

## 2019-02-21 NOTE — ED Provider Notes (Signed)
[facility] EMERGENCY DEPARTMENT Provider Note   CSN: [id number] Arrival date & time: 02/20/19  2357     History   Chief Complaint Chief Complaint  Patient presents with  . Abdominal Pain    HPI [name] [name] is a 48 y.o. female.     The history is provided by the patient.  Abdominal Pain Pain location:  Suprapubic Pain quality: aching   Pain severity:  Moderate Onset quality:  Gradual Duration:  3 days Timing:  Constant Progression:  Worsening Chronicity:  New Relieved by:  None tried Worsened by:  Nothing Associated symptoms: dysuria and nausea   Associated symptoms: no diarrhea, no fever, no vaginal bleeding, no vaginal discharge and no vomiting   Patient with history of HIV, asthma presents with lower abdominal pain.  She reports for the past 3 days she has had lower abdominal pelvic pain.  She reports urinary frequency And some dysuria.  No fevers or vomiting. No vaginal bleeding or discharge She is concerned about STD exposure. Past Medical History:  Diagnosis Date  . Anemia   . Anxiety   . Asthma 04/02/2015  . 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 Depression   . Flu-like symptoms 04/02/2015  . HIV (human immunodeficiency virus infection) (HCC)   . Preterm labor   . Smoker 04/02/2015    Patient Active Problem List   Diagnosis Date Noted  . Numbness and tingling of both feet 01/02/2019  . Asthma 04/02/2015  . Smoker 04/02/2015  . Human immunodeficiency virus (HIV) infection (HCC) 10/16/2014  . Moderate episode of recurrent major depressive disorder (HCC) 10/16/2014  . Depression 03/08/2014  . Menorrhagia 10/25/2011  . Obesity 05/25/2011  . HIV (human immunodeficiency virus infection) (HCC) 05/10/2011    Past Surgical History:  Procedure Laterality Date  . CESAREAN SECTION  04/2000  . CESAREAN SECTION  08/18/2011   Procedure: CESAREAN SECTION;  Surgeon: Reva Bores, MD;  Location: [facility];  Service: Gynecology;   Laterality: N/A;  Repeat.  Will need AZT 3 hours before     OB History    Gravida  7   Para  5   Term  3   Preterm  1   AB  2   Living  5     SAB  2   TAB      Ectopic      Multiple      Live Births  2            Home Medications    Prior to Admission medications   Medication Sig Start Date End Date Taking? Authorizing Provider  albuterol (PROVENTIL HFA;VENTOLIN HFA) 108 (90 BASE) MCG/ACT inhaler Inhale 2 puffs into the lungs every 6 (six) hours as needed for wheezing or shortness of breath. 06/04/13   Daiva Eves, Lisette Grinder, MD  benzonatate (TESSALON) 100 MG capsule Take 1 capsule (100 mg total) by mouth every 8 (eight) hours. 12/30/18   Melene Plan, DO  bictegravir-emtricitabine-tenofovir AF (BIKTARVY) 50-200-25 MG TABS tablet Take 1 tablet by mouth daily. 06/13/18   Judyann Munson, MD  ibuprofen (ADVIL,MOTRIN) 600 MG tablet Take 1 tablet (600 mg total) by mouth every 6 (six) hours as needed for fever or headache. Patient not taking: Reported on 01/02/2019 01/19/17   Sharen Counter A, CNM  IRON PO Take 1 tablet by mouth every morning.    [provider]  lidocaine (LIDODERM) 5 % Place 1 patch onto the skin daily. Remove &  Discard patch within 12 hours or as directed by MD 03/19/18   Harolyn RutherfordJoy, Shawn C, PA-C  Multiple Vitamin (MULTIVITAMIN) tablet Take 1 tablet by mouth daily.    [provider]  Spacer/Aero-Holding Chambers (AEROCHAMBER PLUS) inhaler Use as instructed 06/04/13   Daiva EvesVan Dam, Lisette Grinderornelius N, MD    Family History Family History  Problem Relation Age of Onset  . Other Neg Hx     Social History Social History   Tobacco Use  . Smoking status: Former Smoker    Packs/day: 0.50    Types: Cigarettes  . Smokeless tobacco: Never Used  Substance Use Topics  . Alcohol use: No    Alcohol/week: 0.0 standard drinks  . Drug use: No     Allergies   Nortriptyline, Vicodin [hydrocodone-acetaminophen], Influenza vaccine live, and Latex   Review  of Systems Review of Systems  Constitutional: Negative for fever.  Gastrointestinal: Positive for abdominal pain and nausea. Negative for diarrhea and vomiting.  Genitourinary: Positive for dysuria. Negative for vaginal bleeding and vaginal discharge.  All other systems reviewed and are negative.    Physical Exam Updated Vital Signs BP 122/66 (BP Location: Right Arm)   Pulse 84   Temp 97.8 F (36.6 C) (Oral)   Resp 17   Ht 1.575 m (5\' 2" )   Wt 94.3 kg   LMP 01/20/2019   SpO2 99%   BMI 38.04 kg/m   Physical Exam CONSTITUTIONAL: Well developed/well nourished HEAD: Normocephalic/atraumatic EYES: EOMI/PERRL ENMT: Mucous membranes moist NECK: supple no meningeal signs SPINE/BACK:entire spine nontender CV: S1/S2 noted, no murmurs/rubs/gallops noted LUNGS: Lungs are clear to auscultation bilaterally, no apparent distress ABDOMEN: soft, nontender, no rebound or guarding, bowel sounds noted throughout abdomen GU:no cva tenderness, vaginal scarring noted from previous surgery, minimal whitish discharge.  No vaginal bleeding.  No genital lesions.  No CMT. nurse Theora GianottiEliza present/chaperone for exam NEURO: Pt is awake/alert/appropriate, moves all extremitiesx4.  No facial droop.   EXTREMITIES:  full ROM SKIN: warm, color normal PSYCH: Anxious   ED Treatments / Results  Labs (all labs ordered are listed, but only abnormal results are displayed) Labs Reviewed  WET PREP, GENITAL - Abnormal; Notable for the following components:      Result Value   Clue Cells Wet Prep HPF POC PRESENT (*)    WBC, Wet Prep HPF POC FEW (*)    All other components within normal limits  URINALYSIS, ROUTINE W REFLEX MICROSCOPIC - Abnormal; Notable for the following components:   Specific Gravity, Urine <1.005 (*)    Hgb urine dipstick TRACE (*)    Leukocytes,Ua TRACE (*)    All other components within normal limits  URINALYSIS, MICROSCOPIC (REFLEX) - Abnormal; Notable for the following components:    Bacteria, UA RARE (*)    All other components within normal limits  PREGNANCY, URINE  GC/CHLAMYDIA PROBE AMP () NOT [facility]    EKG None  Radiology No results found.  Procedures Procedures  Medications Ordered in ED Medications  ondansetron (ZOFRAN-ODT) disintegrating tablet 8 mg (8 mg Oral Given 02/21/19 0123)  ibuprofen (ADVIL) tablet 400 mg (400 mg Oral Given 02/21/19 0123)     Initial Impression / Assessment and Plan / ED Course  I have reviewed the triage vital signs and the nursing notes.  Pertinent labs  results that were available during my care of the patient were reviewed by me and considered in my medical decision making (see chart for details).        1:04  AM Pt With history of well-controlled HIV presents with lower pelvic pain. Her abdominal exam is unremarkable.  She voiced concerns about STDs.  Pelvic exam performed with female chaperone the entire time.  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  Scarring was noted from previous surgical procedure.  This did limit pelvic evaluation. Urinalysis and pelvic swabs are pending 1:42 AM Pregnancy test negative.  No convincing signs of urinary tract infection.  Patient denies any vaginal discharge, and no significant white cells noted on wet prep.  Low suspicion for trichomoniasis/BV GC/chlamydia swab pending. Unclear cause of lower abdominal pain.  Although her abdominal exam overall was unremarkable, still unclear cause of her pain.  I did offer further testing including CT imaging.  Patient declined. Patient reports she has a follow-up with her PCP in over 24 hours. Since she is declining imaging, I did discuss strict ER return precautions.  If she has any worsening pain or vomiting in the next 24 hours she must return to ER.  She is requesting a short course of Percocet she can tolerate  Final Clinical Impressions(s) / ED Diagnoses   Final diagnoses:  Lower abdominal pain    ED  Discharge Orders         Ordered    oxyCODONE-acetaminophen (PERCOCET) 5-325 MG tablet  Every 8 hours PRN     02/21/19 0141           Ripley Fraise, MD 02/21/19 0144

## 2019-02-21 NOTE — ED Notes (Signed)
Pt voices concern for STDs.

## 2019-02-22 ENCOUNTER — Encounter: Payer: Self-pay | Admitting: Internal Medicine

## 2019-02-22 ENCOUNTER — Other Ambulatory Visit: Payer: Self-pay

## 2019-02-22 ENCOUNTER — Ambulatory Visit: Payer: BLUE CROSS/BLUE SHIELD | Admitting: Internal Medicine

## 2019-02-22 VITALS — BP 107/72 | HR 78 | Temp 98.4°F | Wt 238.0 lb

## 2019-02-22 DIAGNOSIS — B20 Human immunodeficiency virus [HIV] disease: Secondary | ICD-10-CM | POA: Diagnosis not present

## 2019-02-22 DIAGNOSIS — Z79899 Other long term (current) drug therapy: Secondary | ICD-10-CM | POA: Diagnosis not present

## 2019-02-22 DIAGNOSIS — R635 Abnormal weight gain: Secondary | ICD-10-CM | POA: Diagnosis not present

## 2019-02-22 LAB — GC/CHLAMYDIA PROBE AMP (~~LOC~~) NOT AT ARMC
Chlamydia: NEGATIVE
Neisseria Gonorrhea: NEGATIVE

## 2019-02-22 NOTE — Progress Notes (Signed)
RFV: follow up for HIV disease  Patient ID: [name] [name], female   DOB: 1971/04/17, 48 y.o.   MRN: [id number]  HPI 48yo F with hiv disease, CD 4 cont of 960/VL<20 on biktarvy. Doing well with taking medications. Not missing doses. Discussed how to lose weight.  Outpatient Encounter Medications as of 02/22/2019  Medication Sig  . albuterol (PROVENTIL HFA;VENTOLIN HFA) 108 (90 BASE) MCG/ACT inhaler Inhale 2 puffs into the lungs every 6 (six) hours as needed for wheezing or shortness of breath.  . benzonatate (TESSALON) 100 MG capsule Take 1 capsule (100 mg total) by mouth every 8 (eight) hours.  . bictegravir-emtricitabine-tenofovir AF (BIKTARVY) 50-200-25 MG TABS tablet Take 1 tablet by mouth daily.  Marland Kitchen ibuprofen (ADVIL,MOTRIN) 600 MG tablet Take 1 tablet (600 mg total) by mouth every 6 (six) hours as needed for fever or headache. (Patient not taking: Reported on 01/02/2019)  . IRON PO Take 1 tablet by mouth every morning.  . lidocaine (LIDODERM) 5 % Place 1 patch onto the skin daily. Remove & Discard patch within 12 hours or as directed by MD  . Multiple Vitamin (MULTIVITAMIN) tablet Take 1 tablet by mouth daily.  Marland Kitchen oxyCODONE-acetaminophen (PERCOCET) 5-325 MG tablet Take 1 tablet by mouth every 8 (eight) hours as needed for severe pain.  Marland Kitchen Spacer/Aero-Holding Chambers (AEROCHAMBER PLUS) inhaler Use as instructed   No facility-administered encounter medications on file as of 02/22/2019.      Patient Active Problem List   Diagnosis Date Noted  . Numbness and tingling of both feet 01/02/2019  . Asthma 04/02/2015  . Smoker 04/02/2015  . Human immunodeficiency virus (HIV) infection ([place]) 10/16/2014  . Moderate episode of recurrent major depressive disorder ([place]) 10/16/2014  . Depression 03/08/2014  . Menorrhagia 10/25/2011  . Obesity 05/25/2011  . HIV (human immunodeficiency virus infection) ([place]) 05/10/2011     Health Maintenance Due  Topic Date Due  . PAP SMEAR-Modifier   02/09/2019     Review of Systems  Constitutional: + weight gain. Negative for fever, chills, diaphoresis, activity change, appetite change, fatigue HENT: Negative for congestion, sore throat, rhinorrhea, sneezing, trouble swallowing and sinus pressure.  Eyes: Negative for photophobia and visual disturbance.  Respiratory: Negative for cough, chest tightness, shortness of breath, wheezing and stridor.  Cardiovascular: Negative for chest pain, palpitations and leg swelling.  Gastrointestinal: Negative for nausea, vomiting, abdominal pain, diarrhea, constipation, blood in stool, abdominal distention and anal bleeding.  Genitourinary: Negative for dysuria, hematuria, flank pain and difficulty urinating.  Musculoskeletal: Negative for myalgias, back pain, joint swelling, arthralgias and gait problem.  Skin: Negative for color change, pallor, rash and wound.  Neurological: Negative for dizziness, tremors, weakness and light-headedness.  Hematological: Negative for adenopathy. Does not bruise/bleed easily.  Psychiatric/Behavioral: Negative for behavioral problems, confusion, sleep disturbance, dysphoric mood, decreased concentration and agitation.    Physical Exam   BP 107/72   Pulse 78   Temp 98.4 F (36.9 C)   Wt 238 lb (108 kg)   BMI 43.53 kg/m   Physical Exam  Constitutional:  oriented to person, place, and time. appears well-developed and well-nourished. No distress.  HENT: [place]/AT, PERRLA, no scleral icterus Mouth/Throat: Oropharynx is clear and moist. No oropharyngeal exudate.  Cardiovascular: Normal rate, regular rhythm and normal heart sounds. Exam reveals no gallop and no friction rub.  No murmur heard.  Pulmonary/Chest: Effort normal and breath sounds normal. No respiratory distress.  has no wheezes.  Neck = supple, no nuchal rigidity Abdominal: Soft. Bowel sounds are  normal.  exhibits no distension. There is no tenderness.  Lymphadenopathy: no cervical adenopathy. No axillary  adenopathy Neurological: alert and oriented to person, place, and time.  Skin: Skin is warm and dry. No rash noted. No erythema.  Psychiatric: a normal mood and affect.  behavior is normal.    Lab Results  Component Value Date   CD4TCELL 41 01/09/2019   Lab Results  Component Value Date   CD4TABS 960 01/09/2019   CD4TABS 890 06/01/2018   CD4TABS 840 11/14/2017   Lab Results  Component Value Date   HIV1RNAQUANT <20 NOT DETECTED 01/09/2019   Lab Results  Component Value Date   HEPBSAB POS (A) 05/13/2011   Lab Results  Component Value Date   LABRPR NON-REACTIVE 06/01/2018    CBC Lab Results  Component Value Date   WBC 5.7 01/09/2019   RBC 4.13 01/09/2019   HGB 10.3 (L) 01/09/2019   HCT 32.7 (L) 01/09/2019   PLT 319 01/09/2019   MCV 79.2 (L) 01/09/2019   MCH 24.9 (L) 01/09/2019   MCHC 31.5 (L) 01/09/2019   RDW 19.0 (H) 01/09/2019   LYMPHSABS 3,003 06/01/2018   MONOABS 0.2 08/13/2017   EOSABS 131 06/01/2018    BMET Lab Results  Component Value Date   NA 137 06/01/2018   K 4.0 06/01/2018   CL 104 06/01/2018   CO2 25 06/01/2018   GLUCOSE 86 06/01/2018   BUN 10 06/01/2018   CREATININE 0.53 06/01/2018   CALCIUM 8.7 06/01/2018   GFRNONAA 111 06/01/2018   GFRAA 129 06/01/2018      Assessment and Plan  hiv disease= well controlled. Plan on check hiv labs at next visi. Continue on biktarvy  Wants weight loss info - will give info keto diet as possible way to lose weight.   Long term medication management = cr stable

## 2019-05-14 ENCOUNTER — Other Ambulatory Visit: Payer: Self-pay

## 2019-05-14 ENCOUNTER — Encounter (HOSPITAL_BASED_OUTPATIENT_CLINIC_OR_DEPARTMENT_OTHER): Payer: Self-pay | Admitting: Emergency Medicine

## 2019-05-14 ENCOUNTER — Emergency Department (HOSPITAL_BASED_OUTPATIENT_CLINIC_OR_DEPARTMENT_OTHER)
Admission: EM | Admit: 2019-05-14 | Discharge: 2019-05-14 | Disposition: A | Discharge status: Home / Self Care | Payer: BLUE CROSS/BLUE SHIELD | Attending: Emergency Medicine | Admitting: Emergency Medicine

## 2019-05-14 DIAGNOSIS — Z5321 Procedure and treatment not carried out due to patient leaving prior to being seen by health care provider: Secondary | ICD-10-CM | POA: Diagnosis not present

## 2019-05-14 DIAGNOSIS — R519 Headache, unspecified: Secondary | ICD-10-CM | POA: Insufficient documentation

## 2019-05-14 DIAGNOSIS — R439 Unspecified disturbances of smell and taste: Secondary | ICD-10-CM | POA: Insufficient documentation

## 2019-05-14 DIAGNOSIS — Z20822 Contact with and (suspected) exposure to covid-19: Secondary | ICD-10-CM | POA: Diagnosis not present

## 2019-05-14 NOTE — ED Triage Notes (Signed)
Pt states that she was exposed to someone that was COVID + last wed. She states that she started to have Symptoms on Saturday  - the pt states that she has no taste or smell and Headache

## 2019-05-15 ENCOUNTER — Other Ambulatory Visit: Payer: BLUE CROSS/BLUE SHIELD

## 2019-05-15 ENCOUNTER — Telehealth: Payer: Self-pay | Admitting: *Deleted

## 2019-05-15 NOTE — Telephone Encounter (Signed)
Patient presenting for labs, states she was exposed to Covid and has not yet been tested. Patient states this was 12 days ago, that she doesn't have symptoms. This does not match what she relayed to the emergency room triage yesterday (exposure 12/30, no taste/smell and headache since weekend). RN gave her the number to text for Covid Screening test, told her we would reschedule today's labs. Andree Coss, RN

## 2019-05-30 ENCOUNTER — Other Ambulatory Visit: Payer: Self-pay | Admitting: Internal Medicine

## 2019-06-04 ENCOUNTER — Encounter: Payer: BLUE CROSS/BLUE SHIELD | Admitting: Internal Medicine

## 2019-06-11 ENCOUNTER — Other Ambulatory Visit: Payer: BLUE CROSS/BLUE SHIELD

## 2019-06-11 ENCOUNTER — Other Ambulatory Visit: Payer: Self-pay

## 2019-06-11 DIAGNOSIS — B20 Human immunodeficiency virus [HIV] disease: Secondary | ICD-10-CM

## 2019-06-11 DIAGNOSIS — Z113 Encounter for screening for infections with a predominantly sexual mode of transmission: Secondary | ICD-10-CM

## 2019-06-11 DIAGNOSIS — Z79899 Other long term (current) drug therapy: Secondary | ICD-10-CM

## 2019-06-12 LAB — URINE CYTOLOGY ANCILLARY ONLY
Chlamydia: NEGATIVE
Comment: NEGATIVE
Comment: NORMAL
Neisseria Gonorrhea: NEGATIVE

## 2019-06-12 LAB — T-HELPER CELL (CD4) - (RCID CLINIC ONLY)
CD4 % Helper T Cell: 44 % (ref 33–65)
CD4 T Cell Abs: 1108 /uL (ref 400–1790)

## 2019-06-14 LAB — COMPREHENSIVE METABOLIC PANEL
AG Ratio: 1.3 (calc) (ref 1.0–2.5)
ALT: 11 U/L (ref 6–29)
AST: 14 U/L (ref 10–35)
Albumin: 3.5 g/dL — ABNORMAL LOW (ref 3.6–5.1)
Alkaline phosphatase (APISO): 90 U/L (ref 37–153)
BUN/Creatinine Ratio: 20 (calc) (ref 6–22)
BUN: 10 mg/dL (ref 7–25)
CO2: 25 mmol/L (ref 20–32)
Calcium: 8.3 mg/dL — ABNORMAL LOW (ref 8.6–10.4)
Chloride: 105 mmol/L (ref 98–110)
Creat: 0.49 mg/dL — ABNORMAL LOW (ref 0.50–1.05)
Globulin: 2.8 g/dL (calc) (ref 1.9–3.7)
Glucose, Bld: 92 mg/dL (ref 65–99)
Potassium: 4 mmol/L (ref 3.5–5.3)
Sodium: 138 mmol/L (ref 135–146)
Total Bilirubin: 0.2 mg/dL (ref 0.2–1.2)
Total Protein: 6.3 g/dL (ref 6.1–8.1)

## 2019-06-14 LAB — CBC WITH DIFFERENTIAL/PLATELET
Absolute Monocytes: 520 {cells}/uL (ref 200–950)
Basophils Absolute: 39 {cells}/uL (ref 0–200)
Basophils Relative: 0.6 %
Eosinophils Absolute: 182 {cells}/uL (ref 15–500)
Eosinophils Relative: 2.8 %
HCT: 33.2 % — ABNORMAL LOW (ref 35.0–45.0)
Hemoglobin: 10.9 g/dL — ABNORMAL LOW (ref 11.7–15.5)
Lymphs Abs: 2574 {cells}/uL (ref 850–3900)
MCH: 26.7 pg — ABNORMAL LOW (ref 27.0–33.0)
MCHC: 32.8 g/dL (ref 32.0–36.0)
MCV: 81.2 fL (ref 80.0–100.0)
MPV: 10.3 fL (ref 7.5–12.5)
Monocytes Relative: 8 %
Neutro Abs: 3185 {cells}/uL (ref 1500–7800)
Neutrophils Relative %: 49 %
Platelets: 307 Thousand/uL (ref 140–400)
RBC: 4.09 Million/uL (ref 3.80–5.10)
RDW: 18.8 % — ABNORMAL HIGH (ref 11.0–15.0)
Total Lymphocyte: 39.6 %
WBC: 6.5 Thousand/uL (ref 3.8–10.8)

## 2019-06-14 LAB — HIV-1 RNA QUANT-NO REFLEX-BLD
HIV 1 RNA Quant: <20 {copies}/mL
HIV-1 RNA Quant, Log: <1.3 {Log_copies}/mL

## 2019-06-14 LAB — LIPID PANEL
Cholesterol: 166 mg/dL
HDL: 47 mg/dL — ABNORMAL LOW
LDL Cholesterol (Calc): 100 mg/dL — ABNORMAL HIGH
Non-HDL Cholesterol (Calc): 119 mg/dL
Total CHOL/HDL Ratio: 3.5 (calc)
Triglycerides: 98 mg/dL

## 2019-06-14 LAB — SYPHILIS: RPR W/REFLEX TO RPR TITER AND TREPONEMAL ANTIBODIES, TRADITIONAL SCREENING AND DIAGNOSIS ALGORITHM: RPR Ser Ql: NONREACTIVE

## 2019-06-25 ENCOUNTER — Other Ambulatory Visit: Payer: Self-pay

## 2019-06-25 ENCOUNTER — Ambulatory Visit (INDEPENDENT_AMBULATORY_CARE_PROVIDER_SITE_OTHER): Payer: Self-pay | Admitting: Internal Medicine

## 2019-06-25 DIAGNOSIS — B20 Human immunodeficiency virus [HIV] disease: Secondary | ICD-10-CM

## 2019-06-25 DIAGNOSIS — M170 Bilateral primary osteoarthritis of knee: Secondary | ICD-10-CM

## 2019-06-25 DIAGNOSIS — Z79899 Other long term (current) drug therapy: Secondary | ICD-10-CM

## 2019-06-25 NOTE — Progress Notes (Signed)
RFV: follow up for hiv disease - identified by name and address- televisit  Patient ID: [name] [name], female   DOB: 10-31-1970, 49 y.o.   MRN: [id number]  HPI  [name] [name] is 49 yo F who works in [place] but thinks she may have caught from workplace. She gets tested every week. She had + test from [facility] - jan 5th, now recovered but still not having taste or smell. None of kids caught it from her. She is still fatigued. Otherwise doing okay.  Cd 4 count of 1108/VL<20 on biktarvy.   Outpatient Encounter Medications as of 06/25/2019  Medication Sig  . BIKTARVY 50-200-25 MG TABS tablet TAKE 1 TABLET BY MOUTH DAILY  . albuterol (PROVENTIL HFA;VENTOLIN HFA) 108 (90 BASE) MCG/ACT inhaler Inhale 2 puffs into the lungs every 6 (six) hours as needed for wheezing or shortness of breath.  . benzonatate (TESSALON) 100 MG capsule Take 1 capsule (100 mg total) by mouth every 8 (eight) hours.  Marland Kitchen ibuprofen (ADVIL,MOTRIN) 600 MG tablet Take 1 tablet (600 mg total) by mouth every 6 (six) hours as needed for fever or headache. (Patient not taking: Reported on 01/02/2019)  . IRON PO Take 1 tablet by mouth every morning.  . lidocaine (LIDODERM) 5 % Place 1 patch onto the skin daily. Remove & Discard patch within 12 hours or as directed by MD  . Multiple Vitamin (MULTIVITAMIN) tablet Take 1 tablet by mouth daily.  Marland Kitchen oxyCODONE-acetaminophen (PERCOCET) 5-325 MG tablet Take 1 tablet by mouth every 8 (eight) hours as needed for severe pain.  Marland Kitchen Spacer/Aero-Holding Chambers (AEROCHAMBER PLUS) inhaler Use as instructed   No facility-administered encounter medications on file as of 06/25/2019.     Patient Active Problem List   Diagnosis Date Noted  . Numbness and tingling of both feet 01/02/2019  . Asthma 04/02/2015  . Smoker 04/02/2015  . Human immunodeficiency virus (HIV) infection ([place]) 10/16/2014  . Moderate episode of recurrent major depressive disorder ([place]) 10/16/2014  . Depression 03/08/2014  . Menorrhagia  10/25/2011  . Obesity 05/25/2011  . HIV (human immunodeficiency virus infection) ([place]) 05/10/2011     Health Maintenance Due  Topic Date Due  . PAP SMEAR-Modifier  02/09/2019  . MAMMOGRAM  04/08/2019  . COLONOSCOPY  04/08/2019    Social History   Tobacco Use  . Smoking status: Former Smoker    Packs/day: 0.50    Types: Cigarettes  . Smokeless tobacco: Never Used  Substance Use Topics  . Alcohol use: No    Alcohol/week: 0.0 standard drinks  . Drug use: No   Review of Systems 12 point ros is negative except for fatigue. Physical Exam   There were no vitals taken for this visit. no exam since televisit  Lab Results  Component Value Date   CD4TCELL 44 06/11/2019   Lab Results  Component Value Date   CD4TABS 1,108 06/11/2019   CD4TABS 960 01/09/2019   CD4TABS 890 06/01/2018   Lab Results  Component Value Date   HIV1RNAQUANT <20 NOT DETECTED 06/11/2019   Lab Results  Component Value Date   HEPBSAB POS (A) 05/13/2011   Lab Results  Component Value Date   LABRPR NON-REACTIVE 06/11/2019    CBC Lab Results  Component Value Date   WBC 6.5 06/11/2019   RBC 4.09 06/11/2019   HGB 10.9 (L) 06/11/2019   HCT 33.2 (L) 06/11/2019   PLT 307 06/11/2019   MCV 81.2 06/11/2019   MCH 26.7 (L) 06/11/2019   MCHC 32.8 06/11/2019   RDW  18.8 (H) 06/11/2019   LYMPHSABS 2,574 06/11/2019   MONOABS 0.2 08/13/2017   EOSABS 182 06/11/2019    BMET Lab Results  Component Value Date   NA 138 06/11/2019   K 4.0 06/11/2019   CL 105 06/11/2019   CO2 25 06/11/2019   GLUCOSE 92 06/11/2019   BUN 10 06/11/2019   CREATININE 0.49 (L) 06/11/2019   CALCIUM 8.3 (L) 06/11/2019   GFRNONAA 111 06/01/2018   GFRAA 129 06/01/2018    Assessment and Plan   Osteoarthritis of knees= can try to do acetaminophen as needed  hiv disease= continue biktarvy  Long term medication management = cr is stable  Hx of covid disease=  Likely immune from natural disease; is hesistant to get vaccine

## 2019-07-31 ENCOUNTER — Encounter: Payer: Self-pay | Admitting: Internal Medicine

## 2019-11-30 ENCOUNTER — Other Ambulatory Visit: Payer: Self-pay | Admitting: Internal Medicine

## 2019-12-21 ENCOUNTER — Other Ambulatory Visit: Payer: Self-pay

## 2019-12-21 DIAGNOSIS — B20 Human immunodeficiency virus [HIV] disease: Secondary | ICD-10-CM

## 2019-12-24 ENCOUNTER — Ambulatory Visit: Payer: Medicaid Other

## 2019-12-24 ENCOUNTER — Other Ambulatory Visit: Payer: Medicaid Other

## 2019-12-25 ENCOUNTER — Other Ambulatory Visit: Payer: Self-pay

## 2019-12-25 ENCOUNTER — Ambulatory Visit: Payer: Medicaid Other

## 2019-12-25 DIAGNOSIS — B20 Human immunodeficiency virus [HIV] disease: Secondary | ICD-10-CM

## 2019-12-26 ENCOUNTER — Encounter: Payer: Self-pay | Admitting: Internal Medicine

## 2019-12-26 LAB — T-HELPER CELL (CD4) - (RCID CLINIC ONLY)
CD4 % Helper T Cell: 43 % (ref 33–65)
CD4 T Cell Abs: 937 /uL (ref 400–1790)

## 2019-12-28 LAB — CBC WITH DIFFERENTIAL/PLATELET
Absolute Monocytes: 451 cells/uL (ref 200–950)
Basophils Absolute: 28 cells/uL (ref 0–200)
Basophils Relative: 0.5 %
Eosinophils Absolute: 110 cells/uL (ref 15–500)
Eosinophils Relative: 2 %
HCT: 33.4 % — ABNORMAL LOW (ref 35.0–45.0)
Hemoglobin: 10.8 g/dL — ABNORMAL LOW (ref 11.7–15.5)
Lymphs Abs: 2321 cells/uL (ref 850–3900)
MCH: 27 pg (ref 27.0–33.0)
MCHC: 32.3 g/dL (ref 32.0–36.0)
MCV: 83.5 fL (ref 80.0–100.0)
MPV: 10.3 fL (ref 7.5–12.5)
Monocytes Relative: 8.2 %
Neutro Abs: 2591 cells/uL (ref 1500–7800)
Neutrophils Relative %: 47.1 %
Platelets: 286 10*3/uL (ref 140–400)
RBC: 4 10*6/uL (ref 3.80–5.10)
RDW: 17.5 % — ABNORMAL HIGH (ref 11.0–15.0)
Total Lymphocyte: 42.2 %
WBC: 5.5 10*3/uL (ref 3.8–10.8)

## 2019-12-28 LAB — COMPLETE METABOLIC PANEL WITH GFR
AG Ratio: 1.3 (calc) (ref 1.0–2.5)
ALT: 12 U/L (ref 6–29)
AST: 12 U/L (ref 10–35)
Albumin: 3.5 g/dL — ABNORMAL LOW (ref 3.6–5.1)
Alkaline phosphatase (APISO): 97 U/L (ref 37–153)
BUN: 10 mg/dL (ref 7–25)
CO2: 26 mmol/L (ref 20–32)
Calcium: 8.3 mg/dL — ABNORMAL LOW (ref 8.6–10.4)
Chloride: 105 mmol/L (ref 98–110)
Creat: 0.71 mg/dL (ref 0.50–1.05)
GFR, Est African American: 115 mL/min/{1.73_m2} (ref >=60)
GFR, Est Non African American: 99 mL/min/{1.73_m2} (ref >=60)
Globulin: 2.7 g/dL (calc) (ref 1.9–3.7)
Glucose, Bld: 94 mg/dL (ref 65–99)
Potassium: 4.2 mmol/L (ref 3.5–5.3)
Sodium: 137 mmol/L (ref 135–146)
Total Bilirubin: 0.2 mg/dL (ref 0.2–1.2)
Total Protein: 6.2 g/dL (ref 6.1–8.1)

## 2019-12-28 LAB — HIV-1 RNA QUANT-NO REFLEX-BLD
HIV 1 RNA Quant: <20 Copies/mL
HIV-1 RNA Quant, Log: <1.3 Log cps/mL

## 2020-01-01 ENCOUNTER — Other Ambulatory Visit: Payer: Self-pay | Admitting: Internal Medicine

## 2020-01-15 ENCOUNTER — Encounter: Payer: Medicaid Other | Admitting: Internal Medicine

## 2020-01-31 ENCOUNTER — Other Ambulatory Visit: Payer: Self-pay | Admitting: Internal Medicine

## 2020-02-06 ENCOUNTER — Ambulatory Visit: Payer: Self-pay | Admitting: Internal Medicine

## 2020-02-06 ENCOUNTER — Other Ambulatory Visit: Payer: Self-pay

## 2020-02-06 VITALS — BP 113/79 | HR 70 | Temp 98.2°F | Ht 63.0 in | Wt 233.0 lb

## 2020-02-06 DIAGNOSIS — Z0000 Encounter for general adult medical examination without abnormal findings: Secondary | ICD-10-CM

## 2020-02-06 DIAGNOSIS — E669 Obesity, unspecified: Secondary | ICD-10-CM

## 2020-02-06 DIAGNOSIS — Z79899 Other long term (current) drug therapy: Secondary | ICD-10-CM

## 2020-02-06 DIAGNOSIS — B20 Human immunodeficiency virus [HIV] disease: Secondary | ICD-10-CM

## 2020-02-06 DIAGNOSIS — J4520 Mild intermittent asthma, uncomplicated: Secondary | ICD-10-CM

## 2020-02-06 MED ORDER — BENZONATATE 100 MG PO CAPS: 100.0000 mg | ORAL_CAPSULE | Freq: Three times a day (TID) | ORAL | 0 refills | Status: DC

## 2020-02-06 MED ORDER — ALBUTEROL SULFATE HFA 108 (90 BASE) MCG/ACT IN AERS: 2.0000 | INHALATION_SPRAY | Freq: Four times a day (QID) | RESPIRATORY_TRACT | 3 refills | Status: DC | PRN

## 2020-02-06 MED ORDER — BIKTARVY 50-200-25 MG PO TABS: 1.0000 | ORAL_TABLET | Freq: Every day | ORAL | 6 refills | Status: DC

## 2020-02-06 NOTE — Progress Notes (Signed)
RFV: follow up for hiv disease  Patient ID: [name] [name], female   DOB: 07-27-1970, 49 y.o.   MRN: [id number]  HPI  49yo F with hiv disease, taking biktarvy daily. Cd 4 count of 937/VL<20 (august 2021)  Had moderna vaccine #1, sore arm & vaccine #2 - had significant SE- feels like she has had residual cough in relationship to her vaccine  Moderna, lot number [id number] on 11/27/19 and #2, moderna [id number], 01/01/20 - now 1 month. Feels that now better than 2 weeks  No recent exposure to covid that she knows of.  Outpatient Encounter Medications as of 02/06/2020  Medication Sig  . albuterol (PROVENTIL HFA;VENTOLIN HFA) 108 (90 BASE) MCG/ACT inhaler Inhale 2 puffs into the lungs every 6 (six) hours as needed for wheezing or shortness of breath.  . benzonatate (TESSALON) 100 MG capsule Take 1 capsule (100 mg total) by mouth every 8 (eight) hours.  Marland Kitchen BIKTARVY 50-200-25 MG TABS tablet TAKE 1 TABLET BY MOUTH DAILY  . ibuprofen (ADVIL,MOTRIN) 600 MG tablet Take 1 tablet (600 mg total) by mouth every 6 (six) hours as needed for fever or headache.  . IRON PO Take 1 tablet by mouth every morning.  . Multiple Vitamin (MULTIVITAMIN) tablet Take 1 tablet by mouth daily.  Marland Kitchen Spacer/Aero-Holding Chambers (AEROCHAMBER PLUS) inhaler Use as instructed  . lidocaine (LIDODERM) 5 % Place 1 patch onto the skin daily. Remove & Discard patch within 12 hours or as directed by MD (Patient not taking: Reported on 02/06/2020)  . oxyCODONE-acetaminophen (PERCOCET) 5-325 MG tablet Take 1 tablet by mouth every 8 (eight) hours as needed for severe pain. (Patient not taking: Reported on 02/06/2020)   No facility-administered encounter medications on file as of 02/06/2020.     Patient Active Problem List   Diagnosis Date Noted  . Numbness and tingling of both feet 01/02/2019  . Asthma 04/02/2015  . Smoker 04/02/2015  . Human immunodeficiency virus (HIV) infection ([facility]) 10/16/2014  . Moderate episode of recurrent major  depressive disorder ([facility]) 10/16/2014  . Depression 03/08/2014  . Menorrhagia 10/25/2011  . Obesity 05/25/2011  . HIV (human immunodeficiency virus infection) ([facility]) 05/10/2011     Health Maintenance Due  Topic Date Due  . COVID-19 Vaccine (1) Never done  . PAP SMEAR-Modifier  02/09/2019  . MAMMOGRAM  04/08/2019  . COLONOSCOPY  Never done     Review of Systems  Constitutional: Negative for fever, chills, diaphoresis, activity change, appetite change, fatigue and unexpected weight change.  HENT: Negative for congestion, sore throat, rhinorrhea, sneezing, trouble swallowing and sinus pressure.  Eyes: Negative for photophobia and visual disturbance.  Respiratory: Negative for cough, chest tightness, shortness of breath, wheezing and stridor.  Cardiovascular: Negative for chest pain, palpitations and leg swelling.  Gastrointestinal: Negative for nausea, vomiting, abdominal pain, diarrhea, constipation, blood in stool, abdominal distention and anal bleeding.  Genitourinary: Negative for dysuria, hematuria, flank pain and difficulty urinating.  Musculoskeletal: Negative for myalgias, back pain, joint swelling, arthralgias and gait problem.  Skin: Negative for color change, pallor, rash and wound.  Neurological: Negative for dizziness, tremors, weakness and light-headedness.  Hematological: Negative for adenopathy. Does not bruise/bleed easily.  Psychiatric/Behavioral: Negative for behavioral problems, confusion, sleep disturbance, dysphoric mood, decreased concentration and agitation.    Physical Exam   BP 113/79   Pulse 70   Temp 98.2 F (36.8 C) (Oral)   Ht 5\' 3"  (1.6 m)   Wt 233 lb (105.7 kg)   SpO2 95%   BMI 41.27  kg/m   Physical Exam  Constitutional:  oriented to person, place, and time. appears well-developed and well-nourished. No distress.  HENT: [place]/AT, PERRLA, no scleral icterus Mouth/Throat: Oropharynx is clear and moist. No oropharyngeal exudate.  Cardiovascular:  Normal rate, regular rhythm and normal heart sounds. Exam reveals no gallop and no friction rub.  No murmur heard.  Pulmonary/Chest: Effort normal and breath sounds normal. No respiratory distress.  has no wheezes.  Neck = supple, no nuchal rigidity Abdominal: Soft. Bowel sounds are normal.  exhibits no distension. There is no tenderness.  Lymphadenopathy: no cervical adenopathy. No axillary adenopathy Neurological: alert and oriented to person, place, and time.  Skin: Skin is warm and dry. No rash noted. No erythema.  Psychiatric: a normal mood and affect.  behavior is normal.   Lab Results  Component Value Date   CD4TCELL 43 12/25/2019   Lab Results  Component Value Date   CD4TABS 937 12/25/2019   CD4TABS 1,108 06/11/2019   CD4TABS 960 01/09/2019   Lab Results  Component Value Date   HIV1RNAQUANT <20 12/25/2019   Lab Results  Component Value Date   HEPBSAB POS (A) 05/13/2011   Lab Results  Component Value Date   LABRPR NON-REACTIVE 06/11/2019    CBC Lab Results  Component Value Date   WBC 5.5 12/25/2019   RBC 4.00 12/25/2019   HGB 10.8 (L) 12/25/2019   HCT 33.4 (L) 12/25/2019   PLT 286 12/25/2019   MCV 83.5 12/25/2019   MCH 27.0 12/25/2019   MCHC 32.3 12/25/2019   RDW 17.5 (H) 12/25/2019   LYMPHSABS 2,321 12/25/2019   MONOABS 0.2 08/13/2017   EOSABS 110 12/25/2019    BMET Lab Results  Component Value Date   NA 137 12/25/2019   K 4.2 12/25/2019   CL 105 12/25/2019   CO2 26 12/25/2019   GLUCOSE 94 12/25/2019   BUN 10 12/25/2019   CREATININE 0.71 12/25/2019   CALCIUM 8.3 (L) 12/25/2019   GFRNONAA 99 12/25/2019   GFRAA 115 12/25/2019      Assessment and Plan hiv disease = well controlled, continue on biktarvy.will give refills  Long term medication management = cr remains stable on recent labs.   Health maintenance = it appears that she has not had  breast mammo in several years - will get her scheduled.  Obesity = recommend exercise/diet  modification  Asthma hx = no recent exacerbation but needs refill on inhaler  Flu shot declined.

## 2020-02-28 ENCOUNTER — Other Ambulatory Visit: Payer: Self-pay | Admitting: Obstetrics and Gynecology

## 2020-02-28 DIAGNOSIS — Z1231 Encounter for screening mammogram for malignant neoplasm of breast: Secondary | ICD-10-CM

## 2020-04-10 ENCOUNTER — Ambulatory Visit: Payer: Medicaid Other

## 2020-08-11 ENCOUNTER — Other Ambulatory Visit: Payer: Self-pay | Admitting: Internal Medicine

## 2020-08-14 ENCOUNTER — Other Ambulatory Visit: Payer: Self-pay

## 2020-08-14 DIAGNOSIS — B20 Human immunodeficiency virus [HIV] disease: Secondary | ICD-10-CM

## 2020-08-14 DIAGNOSIS — Z79899 Other long term (current) drug therapy: Secondary | ICD-10-CM

## 2020-08-14 DIAGNOSIS — Z113 Encounter for screening for infections with a predominantly sexual mode of transmission: Secondary | ICD-10-CM

## 2020-08-18 ENCOUNTER — Other Ambulatory Visit: Payer: Self-pay

## 2020-08-18 DIAGNOSIS — B20 Human immunodeficiency virus [HIV] disease: Secondary | ICD-10-CM

## 2020-08-18 DIAGNOSIS — Z79899 Other long term (current) drug therapy: Secondary | ICD-10-CM

## 2020-08-18 DIAGNOSIS — Z113 Encounter for screening for infections with a predominantly sexual mode of transmission: Secondary | ICD-10-CM

## 2020-08-19 LAB — T-HELPER CELL (CD4) - (RCID CLINIC ONLY)
CD4 % Helper T Cell: 44 % (ref 33–65)
CD4 T Cell Abs: 1066 /uL (ref 400–1790)

## 2020-08-19 LAB — URINE CYTOLOGY ANCILLARY ONLY
Chlamydia: NEGATIVE
Comment: NEGATIVE
Comment: NORMAL
Neisseria Gonorrhea: NEGATIVE

## 2020-08-21 LAB — COMPREHENSIVE METABOLIC PANEL
AG Ratio: 1.3 (calc) (ref 1.0–2.5)
ALT: 9 U/L (ref 6–29)
AST: 13 U/L (ref 10–35)
Albumin: 3.7 g/dL (ref 3.6–5.1)
Alkaline phosphatase (APISO): 84 U/L (ref 37–153)
BUN: 9 mg/dL (ref 7–25)
CO2: 23 mmol/L (ref 20–32)
Calcium: 8.8 mg/dL (ref 8.6–10.4)
Chloride: 105 mmol/L (ref 98–110)
Creat: 0.55 mg/dL (ref 0.50–1.05)
Globulin: 2.9 g/dL (calc) (ref 1.9–3.7)
Glucose, Bld: 90 mg/dL (ref 65–99)
Potassium: 4.4 mmol/L (ref 3.5–5.3)
Sodium: 137 mmol/L (ref 135–146)
Total Bilirubin: 0.2 mg/dL (ref 0.2–1.2)
Total Protein: 6.6 g/dL (ref 6.1–8.1)

## 2020-08-21 LAB — LIPID PANEL
Cholesterol: 198 mg/dL (ref <200)
HDL: 69 mg/dL (ref >=50)
LDL Cholesterol (Calc): 113 mg/dL (calc) — ABNORMAL HIGH
Non-HDL Cholesterol (Calc): 129 mg/dL (calc) (ref <130)
Total CHOL/HDL Ratio: 2.9 (calc) (ref <5.0)
Triglycerides: 69 mg/dL (ref <150)

## 2020-08-21 LAB — CBC WITH DIFFERENTIAL/PLATELET
Absolute Monocytes: 422 cells/uL (ref 200–950)
Basophils Absolute: 63 cells/uL (ref 0–200)
Basophils Relative: 1 %
Eosinophils Absolute: 120 cells/uL (ref 15–500)
Eosinophils Relative: 1.9 %
HCT: 34.1 % — ABNORMAL LOW (ref 35.0–45.0)
Hemoglobin: 11.2 g/dL — ABNORMAL LOW (ref 11.7–15.5)
Lymphs Abs: 2696 cells/uL (ref 850–3900)
MCH: 26.7 pg — ABNORMAL LOW (ref 27.0–33.0)
MCHC: 32.8 g/dL (ref 32.0–36.0)
MCV: 81.4 fL (ref 80.0–100.0)
MPV: 10.2 fL (ref 7.5–12.5)
Monocytes Relative: 6.7 %
Neutro Abs: 2999 cells/uL (ref 1500–7800)
Neutrophils Relative %: 47.6 %
Platelets: 310 10*3/uL (ref 140–400)
RBC: 4.19 10*6/uL (ref 3.80–5.10)
RDW: 18.3 % — ABNORMAL HIGH (ref 11.0–15.0)
Total Lymphocyte: 42.8 %
WBC: 6.3 10*3/uL (ref 3.8–10.8)

## 2020-08-21 LAB — RPR: RPR Ser Ql: NONREACTIVE

## 2020-08-21 LAB — HIV-1 RNA QUANT-NO REFLEX-BLD
HIV 1 RNA Quant: NOT DETECTED Copies/mL
HIV-1 RNA Quant, Log: NOT DETECTED Log cps/mL

## 2020-09-01 ENCOUNTER — Encounter: Payer: Medicaid Other | Admitting: Internal Medicine

## 2020-09-29 ENCOUNTER — Encounter: Payer: Medicaid Other | Admitting: Internal Medicine

## 2020-10-01 ENCOUNTER — Other Ambulatory Visit: Payer: Self-pay | Admitting: Internal Medicine

## 2020-10-02 ENCOUNTER — Other Ambulatory Visit: Payer: Self-pay

## 2020-10-02 ENCOUNTER — Ambulatory Visit (INDEPENDENT_AMBULATORY_CARE_PROVIDER_SITE_OTHER): Payer: Self-pay | Admitting: Internal Medicine

## 2020-10-02 VITALS — BP 103/69 | HR 73 | Wt 229.0 lb

## 2020-10-02 DIAGNOSIS — F5101 Primary insomnia: Secondary | ICD-10-CM

## 2020-10-02 DIAGNOSIS — J4520 Mild intermittent asthma, uncomplicated: Secondary | ICD-10-CM

## 2020-10-02 DIAGNOSIS — Z79899 Other long term (current) drug therapy: Secondary | ICD-10-CM

## 2020-10-02 DIAGNOSIS — B20 Human immunodeficiency virus [HIV] disease: Secondary | ICD-10-CM

## 2020-10-02 MED ORDER — ALBUTEROL SULFATE HFA 108 (90 BASE) MCG/ACT IN AERS: 2.0000 | INHALATION_SPRAY | Freq: Four times a day (QID) | RESPIRATORY_TRACT | 1 refills | Status: DC | PRN

## 2020-10-02 MED ORDER — BIKTARVY 50-200-25 MG PO TABS: 1.0000 | ORAL_TABLET | Freq: Every day | ORAL | 6 refills | Status: DC

## 2020-10-02 NOTE — Telephone Encounter (Signed)
Appt 5/26
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
## 2020-10-21 ENCOUNTER — Other Ambulatory Visit: Payer: Self-pay | Admitting: Infectious Diseases

## 2020-10-21 DIAGNOSIS — B20 Human immunodeficiency virus [HIV] disease: Secondary | ICD-10-CM

## 2020-11-05 ENCOUNTER — Other Ambulatory Visit: Payer: Self-pay | Admitting: Family

## 2020-11-05 DIAGNOSIS — B20 Human immunodeficiency virus [HIV] disease: Secondary | ICD-10-CM

## 2021-03-24 ENCOUNTER — Emergency Department (HOSPITAL_BASED_OUTPATIENT_CLINIC_OR_DEPARTMENT_OTHER)
Admission: EM | Admit: 2021-03-24 | Discharge: 2021-03-25 | Disposition: A | Discharge status: Home / Self Care | Attending: Emergency Medicine | Admitting: Emergency Medicine

## 2021-03-24 ENCOUNTER — Other Ambulatory Visit: Payer: Self-pay

## 2021-03-24 ENCOUNTER — Encounter (HOSPITAL_BASED_OUTPATIENT_CLINIC_OR_DEPARTMENT_OTHER): Payer: Self-pay | Admitting: Emergency Medicine

## 2021-03-24 ENCOUNTER — Emergency Department (HOSPITAL_BASED_OUTPATIENT_CLINIC_OR_DEPARTMENT_OTHER): Payer: 59 | Attending: Emergency Medicine

## 2021-03-24 DIAGNOSIS — S62345A Nondisplaced fracture of base of fourth metacarpal bone, left hand, initial encounter for closed fracture: Secondary | ICD-10-CM | POA: Diagnosis not present

## 2021-03-24 DIAGNOSIS — J45909 Unspecified asthma, uncomplicated: Secondary | ICD-10-CM | POA: Insufficient documentation

## 2021-03-24 DIAGNOSIS — Z21 Asymptomatic human immunodeficiency virus [HIV] infection status: Secondary | ICD-10-CM | POA: Diagnosis not present

## 2021-03-24 DIAGNOSIS — I10 Essential (primary) hypertension: Secondary | ICD-10-CM | POA: Insufficient documentation

## 2021-03-24 DIAGNOSIS — Z91040 Latex allergy status: Secondary | ICD-10-CM | POA: Diagnosis not present

## 2021-03-24 DIAGNOSIS — Y990 Civilian activity done for income or pay: Secondary | ICD-10-CM | POA: Insufficient documentation

## 2021-03-24 DIAGNOSIS — S6992XA Unspecified injury of left wrist, hand and finger(s), initial encounter: Secondary | ICD-10-CM | POA: Diagnosis present

## 2021-03-24 DIAGNOSIS — Z87891 Personal history of nicotine dependence: Secondary | ICD-10-CM | POA: Insufficient documentation
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
## 2021-03-25 MED ORDER — KETOROLAC TROMETHAMINE 60 MG/2ML IM SOLN
60.0000 mg | Freq: Once | INTRAMUSCULAR | Status: AC
  Administered 2021-03-25: 60 mg via INTRAMUSCULAR
  Filled 2021-03-25: qty 2

## 2021-03-25 MED ORDER — DICLOFENAC SODIUM ER 100 MG PO TB24: 100.0000 mg | ORAL_TABLET | Freq: Every day | ORAL | 0 refills | Status: DC
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
## 2021-03-27 ENCOUNTER — Other Ambulatory Visit: Payer: Self-pay | Admitting: Orthopedic Surgery

## 2021-03-27 DIAGNOSIS — S62325A Displaced fracture of shaft of fourth metacarpal bone, left hand, initial encounter for closed fracture: Secondary | ICD-10-CM | POA: Insufficient documentation

## 2021-03-30 ENCOUNTER — Encounter (HOSPITAL_BASED_OUTPATIENT_CLINIC_OR_DEPARTMENT_OTHER): Payer: Self-pay | Admitting: Orthopedic Surgery

## 2021-03-30 ENCOUNTER — Other Ambulatory Visit: Payer: Self-pay

## 2021-04-07 ENCOUNTER — Ambulatory Visit (HOSPITAL_BASED_OUTPATIENT_CLINIC_OR_DEPARTMENT_OTHER): Admission: RE | Admit: 2021-04-07 | Source: Home / Self Care | Admitting: Orthopedic Surgery

## 2021-04-07 SURGERY — OPEN REDUCTION INTERNAL FIXATION (ORIF) METACARPAL
Anesthesia: Choice | Site: Ring Finger | Laterality: Left

## 2021-05-13 ENCOUNTER — Encounter: Payer: Self-pay | Admitting: Internal Medicine

## 2021-05-18 ENCOUNTER — Encounter (INDEPENDENT_AMBULATORY_CARE_PROVIDER_SITE_OTHER): Payer: Self-pay

## 2021-07-30 ENCOUNTER — Other Ambulatory Visit: Payer: Self-pay | Admitting: Internal Medicine

## 2021-08-27 ENCOUNTER — Other Ambulatory Visit: Payer: Self-pay | Admitting: Internal Medicine

## 2021-08-27 DIAGNOSIS — B20 Human immunodeficiency virus [HIV] disease: Secondary | ICD-10-CM

## 2021-09-09 ENCOUNTER — Other Ambulatory Visit (HOSPITAL_COMMUNITY)
Admission: RE | Admit: 2021-09-09 | Discharge: 2021-09-09 | Disposition: A | Discharge status: Home / Self Care | Payer: BC Managed Care – PPO | Source: Ambulatory Visit | Attending: Internal Medicine | Admitting: Internal Medicine

## 2021-09-09 ENCOUNTER — Other Ambulatory Visit: Payer: Self-pay

## 2021-09-09 DIAGNOSIS — Z79899 Other long term (current) drug therapy: Secondary | ICD-10-CM

## 2021-09-09 DIAGNOSIS — Z113 Encounter for screening for infections with a predominantly sexual mode of transmission: Secondary | ICD-10-CM

## 2021-09-09 DIAGNOSIS — B20 Human immunodeficiency virus [HIV] disease: Secondary | ICD-10-CM

## 2021-09-10 LAB — URINE CYTOLOGY ANCILLARY ONLY
Chlamydia: NEGATIVE
Comment: NEGATIVE
Comment: NORMAL
Neisseria Gonorrhea: NEGATIVE

## 2021-09-10 LAB — T-HELPER CELL (CD4) - (RCID CLINIC ONLY)
CD4 % Helper T Cell: 42 % (ref 33–65)
CD4 T Cell Abs: 895 /uL (ref 400–1790)

## 2021-09-12 LAB — CBC WITH DIFFERENTIAL/PLATELET
Absolute Monocytes: 414 cells/uL (ref 200–950)
Basophils Absolute: 50 cells/uL (ref 0–200)
Basophils Relative: 0.9 %
Eosinophils Absolute: 101 cells/uL (ref 15–500)
Eosinophils Relative: 1.8 %
HCT: 32.8 % — ABNORMAL LOW (ref 35.0–45.0)
Hemoglobin: 10.2 g/dL — ABNORMAL LOW (ref 11.7–15.5)
Lymphs Abs: 2380 cells/uL (ref 850–3900)
MCH: 24.8 pg — ABNORMAL LOW (ref 27.0–33.0)
MCHC: 31.1 g/dL — ABNORMAL LOW (ref 32.0–36.0)
MCV: 79.8 fL — ABNORMAL LOW (ref 80.0–100.0)
MPV: 10 fL (ref 7.5–12.5)
Monocytes Relative: 7.4 %
Neutro Abs: 2654 cells/uL (ref 1500–7800)
Neutrophils Relative %: 47.4 %
Platelets: 292 10*3/uL (ref 140–400)
RBC: 4.11 10*6/uL (ref 3.80–5.10)
RDW: 18.2 % — ABNORMAL HIGH (ref 11.0–15.0)
Total Lymphocyte: 42.5 %
WBC: 5.6 10*3/uL (ref 3.8–10.8)

## 2021-09-12 LAB — COMPLETE METABOLIC PANEL WITH GFR
AG Ratio: 1.3 (calc) (ref 1.0–2.5)
ALT: 8 U/L (ref 6–29)
AST: 13 U/L (ref 10–35)
Albumin: 3.8 g/dL (ref 3.6–5.1)
Alkaline phosphatase (APISO): 82 U/L (ref 37–153)
BUN: 10 mg/dL (ref 7–25)
CO2: 25 mmol/L (ref 20–32)
Calcium: 8.7 mg/dL (ref 8.6–10.4)
Chloride: 106 mmol/L (ref 98–110)
Creat: 0.59 mg/dL (ref 0.50–1.03)
Globulin: 3 g/dL (calc) (ref 1.9–3.7)
Glucose, Bld: 78 mg/dL (ref 65–99)
Potassium: 4.4 mmol/L (ref 3.5–5.3)
Sodium: 138 mmol/L (ref 135–146)
Total Bilirubin: 0.3 mg/dL (ref 0.2–1.2)
Total Protein: 6.8 g/dL (ref 6.1–8.1)
eGFR: 108 mL/min/{1.73_m2} (ref >=60)

## 2021-09-12 LAB — LIPID PANEL
Cholesterol: 182 mg/dL (ref <200)
HDL: 69 mg/dL (ref >=50)
LDL Cholesterol (Calc): 96 mg/dL (calc)
Non-HDL Cholesterol (Calc): 113 mg/dL (calc) (ref <130)
Total CHOL/HDL Ratio: 2.6 (calc) (ref <5.0)
Triglycerides: 83 mg/dL (ref <150)

## 2021-09-12 LAB — RPR: RPR Ser Ql: NONREACTIVE

## 2021-09-12 LAB — HIV-1 RNA QUANT-NO REFLEX-BLD
HIV 1 RNA Quant: NOT DETECTED copies/mL
HIV-1 RNA Quant, Log: NOT DETECTED Log copies/mL

## 2021-09-17 ENCOUNTER — Ambulatory Visit: Payer: Self-pay | Admitting: Internal Medicine

## 2021-09-24 ENCOUNTER — Encounter: Payer: Self-pay | Admitting: Internal Medicine

## 2021-09-24 ENCOUNTER — Other Ambulatory Visit: Payer: Self-pay

## 2021-09-24 ENCOUNTER — Ambulatory Visit (INDEPENDENT_AMBULATORY_CARE_PROVIDER_SITE_OTHER): Payer: BC Managed Care – PPO | Admitting: Internal Medicine

## 2021-09-24 VITALS — BP 108/72 | HR 76 | Temp 98.1°F | Wt 206.0 lb

## 2021-09-24 DIAGNOSIS — B20 Human immunodeficiency virus [HIV] disease: Secondary | ICD-10-CM | POA: Diagnosis not present

## 2021-09-24 DIAGNOSIS — Z79899 Other long term (current) drug therapy: Secondary | ICD-10-CM

## 2021-09-24 DIAGNOSIS — E669 Obesity, unspecified: Secondary | ICD-10-CM | POA: Diagnosis not present

## 2021-09-24 DIAGNOSIS — D508 Other iron deficiency anemias: Secondary | ICD-10-CM | POA: Diagnosis not present

## 2021-09-24 MED ORDER — BIKTARVY 50-200-25 MG PO TABS: 1.0000 | ORAL_TABLET | Freq: Every day | ORAL | 11 refills | Status: DC
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
## 2021-11-19 ENCOUNTER — Telehealth: Payer: Self-pay

## 2021-11-19 NOTE — Telephone Encounter (Signed)
Received call today from [facility] stating patient missed 8 days of medication due to shipping error. Has not missed any other doses per patient prior to this. Pharmacy just wanted to update office on this. Juanita Laster, RMA

## 2022-05-27 ENCOUNTER — Other Ambulatory Visit (HOSPITAL_COMMUNITY): Payer: Self-pay

## 2022-06-01 ENCOUNTER — Telehealth: Payer: Self-pay

## 2022-06-01 ENCOUNTER — Other Ambulatory Visit (HOSPITAL_COMMUNITY): Payer: Self-pay

## 2022-06-01 NOTE — Telephone Encounter (Signed)
[facility] Patient Advocate Encounter   Was successful in obtaining a [employer] copay card for Biktarvy.  This copay card will make the patients copay $0.00.  I have spoken with the patient.         Giovanny Dugal, CPhT [facility] Phone: [phone] Fax:  [phone]  

## 2022-06-30 DIAGNOSIS — F902 Attention-deficit hyperactivity disorder, combined type: Secondary | ICD-10-CM | POA: Insufficient documentation

## 2022-08-06 ENCOUNTER — Other Ambulatory Visit: Payer: Self-pay

## 2022-08-06 ENCOUNTER — Other Ambulatory Visit (HOSPITAL_COMMUNITY): Payer: Self-pay

## 2022-08-06 MED ORDER — DEXMETHYLPHENIDATE HCL ER 5 MG PO CP24
5.0000 mg | ORAL_CAPSULE | Freq: Every day | ORAL | 0 refills | Status: DC
  Filled 2022-08-06: qty 14, 14d supply, fill #0

## 2022-08-17 ENCOUNTER — Other Ambulatory Visit (HOSPITAL_COMMUNITY): Payer: Self-pay

## 2022-08-18 ENCOUNTER — Other Ambulatory Visit (HOSPITAL_COMMUNITY): Payer: Self-pay

## 2022-09-02 ENCOUNTER — Other Ambulatory Visit (HOSPITAL_COMMUNITY): Payer: Self-pay

## 2022-09-02 ENCOUNTER — Other Ambulatory Visit: Payer: Self-pay

## 2022-09-02 MED ORDER — DEXMETHYLPHENIDATE HCL ER 10 MG PO CP24
10.0000 mg | ORAL_CAPSULE | Freq: Every day | ORAL | 0 refills | Status: DC
  Filled 2022-09-02: qty 21, 21d supply, fill #0

## 2022-09-20 ENCOUNTER — Other Ambulatory Visit (HOSPITAL_COMMUNITY): Payer: Self-pay

## 2022-09-20 MED ORDER — DEXMETHYLPHENIDATE HCL ER 15 MG PO CP24
15.0000 mg | ORAL_CAPSULE | Freq: Every day | ORAL | 0 refills | Status: DC
  Filled 2022-09-24: qty 30, 30d supply, fill #0

## 2022-09-24 ENCOUNTER — Other Ambulatory Visit (HOSPITAL_COMMUNITY): Payer: Self-pay

## 2022-10-18 ENCOUNTER — Other Ambulatory Visit (HOSPITAL_COMMUNITY): Payer: Self-pay

## 2022-10-18 MED ORDER — DEXMETHYLPHENIDATE HCL ER 15 MG PO CP24
15.0000 mg | ORAL_CAPSULE | Freq: Every day | ORAL | 0 refills | Status: DC
  Filled 2022-11-03: qty 30, 30d supply, fill #0

## 2022-11-03 ENCOUNTER — Other Ambulatory Visit (HOSPITAL_COMMUNITY): Payer: Self-pay

## 2022-11-03 ENCOUNTER — Other Ambulatory Visit: Payer: Self-pay

## 2022-12-07 ENCOUNTER — Other Ambulatory Visit: Payer: Self-pay

## 2022-12-07 ENCOUNTER — Other Ambulatory Visit (HOSPITAL_COMMUNITY): Payer: Self-pay

## 2022-12-07 MED ORDER — LISDEXAMFETAMINE DIMESYLATE 10 MG PO CAPS
10.0000 mg | ORAL_CAPSULE | Freq: Every morning | ORAL | 0 refills | Status: DC
  Filled 2022-12-07: qty 15, 15d supply, fill #0

## 2022-12-08 ENCOUNTER — Other Ambulatory Visit (HOSPITAL_COMMUNITY): Payer: Self-pay

## 2022-12-23 ENCOUNTER — Other Ambulatory Visit (HOSPITAL_COMMUNITY): Payer: Self-pay

## 2022-12-23 MED ORDER — LISDEXAMFETAMINE DIMESYLATE 10 MG PO CAPS
10.0000 mg | ORAL_CAPSULE | Freq: Every morning | ORAL | 0 refills | Status: DC
Start: 2022-12-23 — End: 2023-01-19
  Filled 2022-12-23: qty 30, 30d supply, fill #0

## 2022-12-24 ENCOUNTER — Other Ambulatory Visit (HOSPITAL_COMMUNITY): Payer: Self-pay

## 2023-01-19 ENCOUNTER — Other Ambulatory Visit (HOSPITAL_COMMUNITY): Payer: Self-pay

## 2023-01-19 MED ORDER — LISDEXAMFETAMINE DIMESYLATE 10 MG PO CAPS
10.0000 mg | ORAL_CAPSULE | Freq: Every morning | ORAL | 0 refills | Status: DC
Start: 2023-01-19 — End: 2023-03-16
  Filled 2023-01-21 – 2023-02-02 (×2): qty 30, 30d supply, fill #0

## 2023-01-21 ENCOUNTER — Other Ambulatory Visit (HOSPITAL_COMMUNITY): Payer: Self-pay

## 2023-02-02 ENCOUNTER — Other Ambulatory Visit (HOSPITAL_COMMUNITY): Payer: Self-pay

## 2023-02-08 ENCOUNTER — Other Ambulatory Visit (HOSPITAL_COMMUNITY): Payer: Self-pay

## 2023-03-16 ENCOUNTER — Other Ambulatory Visit (HOSPITAL_COMMUNITY): Payer: Self-pay

## 2023-03-16 MED ORDER — LISDEXAMFETAMINE DIMESYLATE 10 MG PO CAPS
10.0000 mg | ORAL_CAPSULE | Freq: Every morning | ORAL | 0 refills | Status: DC
  Filled 2023-03-16 (×2): qty 30, 30d supply, fill #0

## 2023-03-18 ENCOUNTER — Other Ambulatory Visit (HOSPITAL_COMMUNITY): Payer: Self-pay
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
## 2023-04-27 ENCOUNTER — Other Ambulatory Visit (HOSPITAL_COMMUNITY): Payer: Self-pay

## 2023-04-28 ENCOUNTER — Other Ambulatory Visit (HOSPITAL_COMMUNITY): Payer: Self-pay

## 2023-04-29 ENCOUNTER — Other Ambulatory Visit (HOSPITAL_COMMUNITY): Payer: Self-pay

## 2023-05-02 ENCOUNTER — Other Ambulatory Visit (HOSPITAL_COMMUNITY): Payer: Self-pay

## 2023-05-02 ENCOUNTER — Encounter (HOSPITAL_COMMUNITY): Payer: Self-pay

## 2023-06-01 ENCOUNTER — Other Ambulatory Visit (HOSPITAL_COMMUNITY): Payer: Self-pay

## 2023-06-09 ENCOUNTER — Emergency Department (HOSPITAL_BASED_OUTPATIENT_CLINIC_OR_DEPARTMENT_OTHER): Payer: Medicaid Other

## 2023-06-09 ENCOUNTER — Other Ambulatory Visit: Payer: Self-pay

## 2023-06-09 ENCOUNTER — Encounter (HOSPITAL_BASED_OUTPATIENT_CLINIC_OR_DEPARTMENT_OTHER): Payer: Self-pay

## 2023-06-09 ENCOUNTER — Emergency Department (HOSPITAL_BASED_OUTPATIENT_CLINIC_OR_DEPARTMENT_OTHER)
Admission: EM | Admit: 2023-06-09 | Discharge: 2023-06-09 | Disposition: A | Discharge status: Home / Self Care | Payer: Medicaid Other | Attending: Emergency Medicine | Admitting: Emergency Medicine

## 2023-06-09 DIAGNOSIS — Z21 Asymptomatic human immunodeficiency virus [HIV] infection status: Secondary | ICD-10-CM | POA: Insufficient documentation

## 2023-06-09 DIAGNOSIS — F17200 Nicotine dependence, unspecified, uncomplicated: Secondary | ICD-10-CM | POA: Diagnosis not present

## 2023-06-09 DIAGNOSIS — Z1152 Encounter for screening for COVID-19: Secondary | ICD-10-CM | POA: Insufficient documentation

## 2023-06-09 DIAGNOSIS — J45909 Unspecified asthma, uncomplicated: Secondary | ICD-10-CM | POA: Diagnosis not present

## 2023-06-09 DIAGNOSIS — Z91040 Latex allergy status: Secondary | ICD-10-CM | POA: Insufficient documentation

## 2023-06-09 DIAGNOSIS — J069 Acute upper respiratory infection, unspecified: Secondary | ICD-10-CM | POA: Diagnosis not present

## 2023-06-09 DIAGNOSIS — R059 Cough, unspecified: Secondary | ICD-10-CM | POA: Diagnosis present

## 2023-06-09 LAB — RESP PANEL BY RT-PCR (RSV, FLU A&B, COVID)  RVPGX2
Influenza A by PCR: NEGATIVE
Influenza B by PCR: NEGATIVE
Resp Syncytial Virus by PCR: NEGATIVE
SARS Coronavirus 2 by RT PCR: NEGATIVE

## 2023-06-09 MED ORDER — ALBUTEROL SULFATE HFA 108 (90 BASE) MCG/ACT IN AERS
2.0000 | INHALATION_SPRAY | Freq: Once | RESPIRATORY_TRACT | Status: AC
  Administered 2023-06-09: 2 via RESPIRATORY_TRACT
  Filled 2023-06-09: qty 6.7

## 2023-06-09 MED ORDER — ALBUTEROL SULFATE HFA 108 (90 BASE) MCG/ACT IN AERS: 2.0000 | INHALATION_SPRAY | Freq: Four times a day (QID) | RESPIRATORY_TRACT | 1 refills | Status: AC | PRN

## 2023-06-09 NOTE — ED Triage Notes (Addendum)
Pt states she has had flu-like symptoms since Tuesday.   Body aches, chills, cough.  Hx of asthma Coughing a lot in triage and pt c/o rib pain

## 2023-06-09 NOTE — Discharge Instructions (Addendum)
You are seen today for flulike symptoms.  This is most likely a viral infection.  Low suspicion for any bacterial infection at this time due to clear x-ray and normal vital signs.  However due to you not having undergone HIV treatment for the last year and a half, recommend that you follow-up with your PCP and/or the health department for discussing treatment options.  With your disease process of HIV, insurance is typically not too much of a concern.  I have represcribed your albuterol.  You will be given it before you leave today.  You can take 2 puffs every 6 hours as needed.  If you begin to develop worsening fever, worsening shortness of breath, worsening chest pain, nausea, vomiting, worsening sore throat, return to the ED for further evaluation.

## 2023-06-09 NOTE — ED Provider Notes (Signed)
[facility] Provider Note   CSN: [id number] Arrival date & time: 06/09/23  1946     History  Chief Complaint  Patient presents with   flu-like symptoms    [name] [name] is a 53 y.o. female.  HPI Patient is a 47 female presents the ED today complaining of flulike symptoms starting Tuesday including body aches, chills, cough.  History of HIV, asthma, MDD.  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  Dates that she has been feeling bad for the last 2 days endorsing congestion, chest pain worse with coughing, mild shortness of breath.  Denies fever, abdominal pain, nausea, vomiting, diarrhea, dysuria, lower extremity pain, lower extremity swelling.      Home Medications Prior to Admission medications   Medication Sig Start Date End Date Taking? Authorizing Provider  albuterol (VENTOLIN HFA) 108 (90 Base) MCG/ACT inhaler Inhale 2 puffs into the lungs every 6 (six) hours as needed for wheezing or shortness of breath. 06/09/23   Lunette Stands, PA-C  bictegravir-emtricitabine-tenofovir AF (BIKTARVY) 50-200-25 MG TABS tablet Take 1 tablet by mouth daily. 09/24/21   Judyann Munson, MD  dexmethylphenidate (FOCALIN XR) 10 MG 24 hr capsule Take 1 capsule (10 mg total) by mouth daily. 09/02/22     dexmethylphenidate (FOCALIN XR) 15 MG 24 hr capsule Take 1 capsule (15 mg total) by mouth daily. 09/23/22     dexmethylphenidate (FOCALIN XR) 15 MG 24 hr capsule Take 1 capsule (15 mg total) by mouth daily. 10/24/22     dexmethylphenidate (FOCALIN XR) 5 MG 24 hr capsule Take 1 capsule (5 mg total) by mouth daily. 08/06/22     IRON PO Take 1 tablet by mouth every morning.    [provider]  lisdexamfetamine (VYVANSE) 10 MG capsule Take 1 capsule (10 mg total) by mouth in the morning. 03/16/23     Multiple Vitamin (MULTIVITAMIN) tablet Take 1 tablet by mouth daily.    [provider]   Spacer/Aero-Holding Chambers (AEROCHAMBER PLUS) inhaler Use as instructed 06/04/13   Daiva Eves, Lisette Grinder, MD      Allergies    Haemophilus b polysaccharide vaccine, Vicodin [hydrocodone-acetaminophen], Influenza vaccine live, and Latex    Review of Systems   Review of Systems  HENT:  Positive for congestion.   Respiratory:  Positive for cough.   All other systems reviewed and are negative.   Physical Exam Updated Vital Signs BP 131/81 (BP Location: Left Arm)   Pulse 70   Temp 98.5 F (36.9 C)   Resp 18   Ht 5\' 3"  (1.6 m)   Wt 111.1 kg   LMP 05/25/2023   SpO2 100%   BMI 43.40 kg/m  Physical Exam Vitals and nursing note reviewed.  Constitutional:      General: She is not in acute distress.    Appearance: Normal appearance. She is not ill-appearing.  HENT:     Head: Normocephalic and atraumatic.     Nose: Congestion present.     Mouth/Throat:     Mouth: Mucous membranes are moist.     Pharynx: Oropharynx is clear. No oropharyngeal exudate or posterior oropharyngeal erythema.  Eyes:     General: No scleral icterus.       Right eye: No discharge.        Left eye: No discharge.     Extraocular Movements: Extraocular movements intact.     Conjunctiva/sclera: Conjunctivae normal.  Cardiovascular:     Rate and Rhythm: Normal rate and regular rhythm.     Pulses: Normal pulses.     Heart sounds: Normal heart sounds. No murmur heard. Pulmonary:     Effort: Pulmonary effort is normal. No respiratory distress.     Breath sounds: No stridor. No wheezing, rhonchi or rales.  Chest:     Chest wall: Tenderness (Chest wall tenderness noted bilaterally.) present.  Abdominal:     General: Abdomen is flat.     Palpations: Abdomen is soft.     Tenderness: There is no abdominal tenderness. There is no right CVA tenderness or left CVA tenderness.  Musculoskeletal:        General: No swelling, tenderness or deformity. Normal range of motion.     Cervical back: Normal range of motion  and neck supple. No rigidity or tenderness.     Right lower leg: No edema.     Left lower leg: No edema.  Skin:    General: Skin is warm and dry.     Coloration: Skin is not jaundiced or pale.     Findings: No erythema.  Neurological:     General: No focal deficit present.     Mental Status: She is alert and oriented to person, place, and time. Mental status is at baseline.     Motor: No weakness.  Psychiatric:        Mood and Affect: Mood normal.     ED Results / Procedures / Treatments   Labs (all labs ordered are listed, but only abnormal results are displayed) Labs Reviewed  RESP PANEL BY RT-PCR (RSV, FLU A&B, COVID)  [id number]    EKG None  Radiology DG Chest 2 View Result Date: 06/09/2023 CLINICAL DATA:  Cough 53 y/o female. Pt states she has had flu-like symptoms since Tuesday. Body aches, chills, cough. Hx of asthma EXAM: CHEST - 2 VIEW COMPARISON:  Chest x-ray 08/30/2018, CT chest 02/11/2014 FINDINGS: The heart and mediastinal contours are within normal limits. No focal consolidation. No pulmonary edema. No pleural effusion. No pneumothorax. No acute osseous abnormality. IMPRESSION: No active cardiopulmonary disease. Electronically Signed   By: Tish Frederickson M.D.   On: 06/09/2023 21:04    Procedures Procedures    Medications Ordered in ED Medications  albuterol (VENTOLIN HFA) 108 (90 Base) MCG/ACT inhaler 2 puff (has no administration in time range)    ED Course/ Medical Decision Making/ A&P                                 Medical Decision Making Amount and/or Complexity of Data Reviewed Radiology: ordered.   This patient is a 53 year old female who presents to the ED for concern of flulike symptoms.   Differential diagnoses prior to evaluation: The emergent differential diagnosis includes, but is not limited to, HIV related section, pneumonia, bronchitis, URI. This is not an exhaustive differential.   Past Medical History / Co-morbidities / Social  History: IV, asthma, smoker, MDD  Additional history: Chart reviewed. Pertinent results include:     Lab Tests/Imaging studies: I personally interpreted labs/imaging and the pertinent results include:   Respiratory panel negative Chest x-ray unremarkable I agree with the radiologist interpretation.    Medications: Medication needed for this visit.  I have reviewed the patients home medicines and have made adjustments as needed.  ED Course:  Patient is 53 year old female presents the ED today complaining of a 2-day  history of cough, congestion, chest pain worse with coughing, mild shortness of breath.  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  Patient has a previous medical history of HIV, asthma.  She states that she has not had any HIV treatment in the last 6 months due to insurance issues.  According to her and her chart last HIV evaluation visit was in 2023.  Dates that she has not made his appointments due to issues with her child's father.  Patient states that she has been using her inhaler with relief however is low.  Will represcribe her albuterol.  Physical exam was entirely unremarkable.  Patient vital signs have been stable and remained stable to the course of her time here.  Low suspicion for any acute emergency at this point.  Considered possible underlying HIV related illness however due to patient's presentation and symptoms and vital signs low suspicion for emergent process at this time.  Recommend that she follow-up with the primary care and the health department for further management of this disease.  I believe this patient is to be discharged this time.  Patient case was discussed with attending who agreed with plan.  Patient expressed understanding of plan.     Disposition: After consideration of the diagnostic results and the patients response to treatment, I feel that patient benefit from discharge and treatment  as above.   emergency department workup does not suggest an emergent condition requiring admission or immediate intervention beyond what has been performed at this time. The plan is: Symptomatic treatment, albuterol, follow-up with health department and PCP, return for any new or worsening symptoms. The patient is safe for discharge and has been instructed to return immediately for worsening symptoms, change in symptoms or any other concerns.  Final Clinical Impression(s) / ED Diagnoses Final diagnoses:  Viral upper respiratory tract infection    Rx / DC Orders ED Discharge Orders          Ordered    albuterol (VENTOLIN HFA) 108 (90 Base) MCG/ACT inhaler  Every 6 hours PRN        06/09/23 2251              Lunette Stands, PA-C 06/09/23 2251    Vanetta Mulders, MD 06/10/23 [phone]

## 2023-06-13 ENCOUNTER — Other Ambulatory Visit: Payer: Medicaid Other

## 2023-06-13 ENCOUNTER — Other Ambulatory Visit (HOSPITAL_COMMUNITY)
Admission: RE | Admit: 2023-06-13 | Discharge: 2023-06-13 | Disposition: A | Discharge status: Home / Self Care | Payer: Medicaid Other | Source: Ambulatory Visit | Attending: Internal Medicine | Admitting: Internal Medicine

## 2023-06-13 ENCOUNTER — Other Ambulatory Visit: Payer: Self-pay

## 2023-06-13 DIAGNOSIS — B20 Human immunodeficiency virus [HIV] disease: Secondary | ICD-10-CM

## 2023-06-13 DIAGNOSIS — Z113 Encounter for screening for infections with a predominantly sexual mode of transmission: Secondary | ICD-10-CM | POA: Insufficient documentation

## 2023-06-13 DIAGNOSIS — Z79899 Other long term (current) drug therapy: Secondary | ICD-10-CM

## 2023-06-14 ENCOUNTER — Other Ambulatory Visit: Payer: Self-pay

## 2023-06-14 ENCOUNTER — Telehealth: Payer: Self-pay

## 2023-06-14 ENCOUNTER — Other Ambulatory Visit (HOSPITAL_COMMUNITY): Payer: Self-pay

## 2023-06-14 ENCOUNTER — Ambulatory Visit: Payer: Medicaid Other | Admitting: Internal Medicine

## 2023-06-14 ENCOUNTER — Other Ambulatory Visit: Payer: Self-pay | Admitting: Pharmacist

## 2023-06-14 DIAGNOSIS — B20 Human immunodeficiency virus [HIV] disease: Secondary | ICD-10-CM

## 2023-06-14 LAB — T-HELPER CELL (CD4) - (RCID CLINIC ONLY)
CD4 % Helper T Cell: 45 % (ref 33–65)
CD4 T Cell Abs: 1027 /uL (ref 400–1790)

## 2023-06-14 LAB — URINE CYTOLOGY ANCILLARY ONLY
Chlamydia: NEGATIVE
Comment: NEGATIVE
Comment: NORMAL
Neisseria Gonorrhea: NEGATIVE

## 2023-06-14 MED ORDER — BIKTARVY 50-200-25 MG PO TABS
1.0000 | ORAL_TABLET | Freq: Every day | ORAL | 0 refills | Status: DC
  Filled 2023-06-14 – 2023-06-15 (×3): qty 30, 30d supply, fill #0

## 2023-06-14 MED ORDER — BIKTARVY 50-200-25 MG PO TABS
1.0000 | ORAL_TABLET | Freq: Every day | ORAL | 0 refills | Status: DC
  Filled 2023-06-14: qty 30, 30d supply, fill #0

## 2023-06-14 NOTE — Progress Notes (Signed)
 Specialty Pharmacy Initial Fill Coordination Note  [name] [name] is a 53 y.o. female contacted today regarding initial fill of specialty medication(s) Bictegravir-Emtricitab-Tenofov (Biktarvy )   Patient requested [name] at [facility] [place] date: 06/15/23   Medication will be filled on 06/15/23.   Patient is aware of $0 copayment.

## 2023-06-14 NOTE — Progress Notes (Signed)
 Specialty Pharmacy Initiation Note   [name] [name] is a 53 y.o. female who will be followed by the specialty pharmacy service for RxSp HIV    Review of administration, indication, effectiveness, safety, potential side effects, storage/disposable, and missed dose instructions occurred today for patient's specialty medication(s) Bictegravir-Emtricitab-Tenofov (Biktarvy )     Patient/Caregiver did not have any additional questions or concerns.   Patient's therapy is appropriate to: Continue    Goals Addressed             This Visit's Progress    Achieve Undetectable HIV Viral Load < 20       Patient is not on track and no change. Patient will work on increased adherence      Comply with lab assessments       Patient is not on track and improving. Patient will adhere to provider and/or lab appointments      Increase CD4 count until steady state       Patient is not on track and no change. Patient will work on increased adherence      Maintain optimal adherence to therapy       Patient is not on track and improving. Patient will work on increased adherence         Alan JINNY Geralds Specialty Pharmacist

## 2023-06-14 NOTE — Telephone Encounter (Signed)
 Pharmacy Patient Advocate Encounter  Insurance verification completed.   The patient is insured through  [employer]    Ran test claim for Biktarvy . Currently a quantity of 30 is a 30 day supply and the co-pay is 0.00 .   This test claim was processed through [facility]- copay amounts may vary at other pharmacies due to pharmacy/plan contracts, or as the patient moves through the different stages of their insurance plan.

## 2023-06-14 NOTE — Telephone Encounter (Signed)
 Patient called office to reschedule appt with Dr. Dennise. States that work has been a barrier to care and is trying to work on that. Other barriers include custody battle, transportation. Lawyer funds.  Has been off meds for one year now, but is trying to get back into care. Per Dr. Dennise okay to send in 30 day supply until pt is seen.  Labs drawn 2/3.  Lorenda CHRISTELLA Code, RMA

## 2023-06-15 ENCOUNTER — Other Ambulatory Visit (HOSPITAL_COMMUNITY): Payer: Self-pay

## 2023-06-15 LAB — CBC WITH DIFFERENTIAL/PLATELET
Absolute Lymphocytes: 2444 {cells}/uL (ref 850–3900)
Absolute Monocytes: 397 {cells}/uL (ref 200–950)
Basophils Absolute: 38 {cells}/uL (ref 0–200)
Basophils Relative: 0.6 %
Eosinophils Absolute: 101 {cells}/uL (ref 15–500)
Eosinophils Relative: 1.6 %
HCT: 31.3 % — ABNORMAL LOW (ref 35.0–45.0)
Hemoglobin: 10 g/dL — ABNORMAL LOW (ref 11.7–15.5)
MCH: 25.7 pg — ABNORMAL LOW (ref 27.0–33.0)
MCHC: 31.9 g/dL — ABNORMAL LOW (ref 32.0–36.0)
MCV: 80.5 fL (ref 80.0–100.0)
MPV: 10.6 fL (ref 7.5–12.5)
Monocytes Relative: 6.3 %
Neutro Abs: 3320 {cells}/uL (ref 1500–7800)
Neutrophils Relative %: 52.7 %
Platelets: 314 Thousand/uL (ref 140–400)
RBC: 3.89 Million/uL (ref 3.80–5.10)
RDW: 19 % — ABNORMAL HIGH (ref 11.0–15.0)
Total Lymphocyte: 38.8 %
WBC: 6.3 Thousand/uL (ref 3.8–10.8)

## 2023-06-15 LAB — COMPLETE METABOLIC PANEL WITH GFR
AG Ratio: 1.4 (calc) (ref 1.0–2.5)
ALT: 14 U/L (ref 6–29)
AST: 15 U/L (ref 10–35)
Albumin: 3.8 g/dL (ref 3.6–5.1)
Alkaline phosphatase (APISO): 96 U/L (ref 37–153)
BUN: 12 mg/dL (ref 7–25)
CO2: 25 mmol/L (ref 20–32)
Calcium: 8.7 mg/dL (ref 8.6–10.4)
Chloride: 104 mmol/L (ref 98–110)
Creat: 0.5 mg/dL (ref 0.50–1.03)
Globulin: 2.8 g/dL (ref 1.9–3.7)
Glucose, Bld: 91 mg/dL (ref 65–99)
Potassium: 4.1 mmol/L (ref 3.5–5.3)
Sodium: 138 mmol/L (ref 135–146)
Total Bilirubin: 0.2 mg/dL (ref 0.2–1.2)
Total Protein: 6.6 g/dL (ref 6.1–8.1)
eGFR: 111 mL/min/{1.73_m2} (ref >=60)

## 2023-06-15 LAB — LIPID PANEL
Cholesterol: 180 mg/dL
HDL: 65 mg/dL
LDL Cholesterol (Calc): 100 mg/dL — ABNORMAL HIGH
Non-HDL Cholesterol (Calc): 115 mg/dL
Total CHOL/HDL Ratio: 2.8 (calc)
Triglycerides: 64 mg/dL

## 2023-06-15 LAB — HIV-1 RNA QUANT-NO REFLEX-BLD
HIV 1 RNA Quant: NOT DETECTED {copies}/mL
HIV-1 RNA Quant, Log: NOT DETECTED {Log}

## 2023-06-15 LAB — SYPHILIS: RPR W/REFLEX TO RPR TITER AND TREPONEMAL ANTIBODIES, TRADITIONAL SCREENING AND DIAGNOSIS ALGORITHM: RPR Ser Ql: NONREACTIVE

## 2023-06-16 ENCOUNTER — Ambulatory Visit: Payer: Medicaid Other | Admitting: Internal Medicine

## 2023-06-21 DIAGNOSIS — R7303 Prediabetes: Secondary | ICD-10-CM | POA: Insufficient documentation

## 2023-06-28 ENCOUNTER — Other Ambulatory Visit (HOSPITAL_COMMUNITY): Payer: Self-pay

## 2023-06-28 MED ORDER — WEGOVY 0.25 MG/0.5ML ~~LOC~~ SOAJ
0.2500 mg | SUBCUTANEOUS | 0 refills | Status: DC
  Filled 2023-06-28 – 2023-07-04 (×3): qty 2, 28d supply, fill #0

## 2023-06-29 ENCOUNTER — Encounter: Payer: Self-pay | Admitting: Internal Medicine

## 2023-06-30 ENCOUNTER — Ambulatory Visit: Payer: Medicaid Other | Admitting: Infectious Diseases

## 2023-06-30 ENCOUNTER — Other Ambulatory Visit: Payer: Self-pay

## 2023-07-01 ENCOUNTER — Other Ambulatory Visit: Payer: Self-pay

## 2023-07-04 ENCOUNTER — Other Ambulatory Visit (HOSPITAL_COMMUNITY): Payer: Self-pay

## 2023-07-04 ENCOUNTER — Other Ambulatory Visit: Payer: Self-pay

## 2023-07-11 ENCOUNTER — Other Ambulatory Visit: Payer: Self-pay | Admitting: Internal Medicine

## 2023-07-11 ENCOUNTER — Other Ambulatory Visit: Payer: Self-pay

## 2023-07-11 ENCOUNTER — Other Ambulatory Visit (HOSPITAL_COMMUNITY): Payer: Self-pay

## 2023-07-11 DIAGNOSIS — B20 Human immunodeficiency virus [HIV] disease: Secondary | ICD-10-CM

## 2023-07-11 MED ORDER — BIKTARVY 50-200-25 MG PO TABS
1.0000 | ORAL_TABLET | Freq: Every day | ORAL | 0 refills | Status: DC
  Filled 2023-07-11 – 2023-07-18 (×2): qty 30, 30d supply, fill #0

## 2023-07-11 NOTE — Progress Notes (Signed)
 Specialty Pharmacy Refill Coordination Note  [name] [name] is a 53 y.o. female contacted today regarding refills of specialty medication(s) Bictegravir-Emtricitab-Tenofov Musician)   Patient requested [name] at [facility] at [place] date: 07/14/23   Medication will be filled on 07/13/23.   This fill date is pending response to refill request from provider. Patient is aware and if they have not received fill by intended date, they must follow up with pharmacy.

## 2023-07-13 ENCOUNTER — Other Ambulatory Visit: Payer: Self-pay

## 2023-07-14 ENCOUNTER — Other Ambulatory Visit (HOSPITAL_COMMUNITY): Payer: Self-pay

## 2023-07-18 ENCOUNTER — Other Ambulatory Visit (HOSPITAL_COMMUNITY): Payer: Self-pay

## 2023-07-21 ENCOUNTER — Other Ambulatory Visit (HOSPITAL_COMMUNITY): Payer: Self-pay

## 2023-07-21 ENCOUNTER — Other Ambulatory Visit: Payer: Self-pay

## 2023-07-21 ENCOUNTER — Ambulatory Visit: Payer: Medicaid Other | Admitting: Infectious Diseases

## 2023-07-21 VITALS — BP 117/81 | HR 84 | Resp 16 | Ht 63.0 in | Wt 248.0 lb

## 2023-07-21 DIAGNOSIS — B20 Human immunodeficiency virus [HIV] disease: Secondary | ICD-10-CM

## 2023-07-21 DIAGNOSIS — Z113 Encounter for screening for infections with a predominantly sexual mode of transmission: Secondary | ICD-10-CM | POA: Insufficient documentation

## 2023-07-21 DIAGNOSIS — Z7185 Encounter for immunization safety counseling: Secondary | ICD-10-CM | POA: Insufficient documentation

## 2023-07-21 DIAGNOSIS — Z5181 Encounter for therapeutic drug level monitoring: Secondary | ICD-10-CM | POA: Insufficient documentation

## 2023-07-21 DIAGNOSIS — Z0000 Encounter for general adult medical examination without abnormal findings: Secondary | ICD-10-CM | POA: Insufficient documentation

## 2023-07-21 MED ORDER — BIKTARVY 50-200-25 MG PO TABS
1.0000 | ORAL_TABLET | Freq: Every day | ORAL | 11 refills | Status: DC
  Filled 2023-07-21: qty 30, 30d supply, fill #0

## 2023-07-21 MED ORDER — BIKTARVY 50-200-25 MG PO TABS
1.0000 | ORAL_TABLET | Freq: Every day | ORAL | 11 refills | Status: DC
  Filled 2023-07-21 – 2023-08-15 (×2): qty 30, 30d supply, fill #0
  Filled 2023-09-05: qty 30, 30d supply, fill #1
  Filled 2023-10-07: qty 30, 30d supply, fill #2
  Filled 2023-11-07: qty 30, 30d supply, fill #3
  Filled 2023-12-01: qty 30, 30d supply, fill #4
  Filled 2023-12-23 – 2024-01-23 (×3): qty 30, 30d supply, fill #5
  Filled 2024-02-17: qty 30, 30d supply, fill #6
  Filled 2024-03-14: qty 30, 30d supply, fill #7

## 2023-07-21 NOTE — Progress Notes (Signed)
 [address], [place], [place], [address]                                                                  Phn. [phone]; Fax: [phone]                                                                             Date: 07/21/23  Reason for Visit: Routine HIV care.    HPI: [name] [name] is a 53 y.o.old female originally from [place],  with a history of HIV, anemia, anxiety/depression, asthma who is back to HIV care.   Last seen on 10/04/21.  Lab Results  Component Value Date   HIV1RNAQUANT Not Detected 06/13/2023   Lab Results  Component Value Date   CD4TABS 1,027 06/13/2023   CD4TABS 895 09/09/2021   CD4TABS 1,066 08/18/2020   Interval hx/current visit: He reports approximately 55-month interruption in antiretroviral therapy. She resumed Biktarvy last month and reports adherence since then. She denies any other medical problems and describes herself as generally healthy. She has been living in the [place] since 2000 when she moved from [place] during a war.  She lives with her 2 children and works as a Engineer, site. She denies current or recent use of tobacco, alcohol, or recreational drugs. She has a primary care doctor and is generally up to date with cancer screenings, but a recent Pap smear was postponed due to menstruation. She has to fu with dentist. Declined vaccines altogether, flu, covid pna. She has no complaints today.  ROS: As stated in above HPI; all other systems were reviewed and are otherwise negative unless noted below  No reported fever / chills, night sweats, unintentional weight loss, acute visual change, odynophagia, chest pain/pressure, new or worsened SOB or WOB, nausea, vomiting, diarrhea, dysuria, GU discharge, syncope, seizures, red/hot swollen  joints, hallucinations / delusions, rashes, new allergies, unusual / excessive bleeding, swollen lymph nodes, or new hospitalizations/ED visits/Urgent Care visits since the pt was last seen.  PMH/ PSH/ FamHx / Social Hx , medications and allergies reviewed and updated as appropriate; please see corresponding tab in EHR / prior notes                                        Current Outpatient Medications on File Prior to Visit  Medication Sig Dispense Refill   acetaminophen (TYLENOL) 500 MG tablet Take by mouth.     albuterol (VENTOLIN HFA) 108 (90 Base) MCG/ACT inhaler Inhale 2 puffs into the lungs every 6 (six) hours  as needed for wheezing or shortness of breath. 6.7 g 1   IRON PO Take 1 tablet by mouth every morning.     lisdexamfetamine (VYVANSE) 10 MG capsule Take 1 capsule (10 mg total) by mouth in the morning. 30 capsule 0   Multiple Vitamin (MULTIVITAMIN) tablet Take 1 tablet by mouth daily.     Semaglutide-Weight Management (WEGOVY) 0.25 MG/0.5ML SOAJ Inject 0.25 mg into the skin once a week. 2 mL 0   Spacer/Aero-Holding Chambers (AEROCHAMBER PLUS) inhaler Use as instructed 1 each 2   dexmethylphenidate (FOCALIN XR) 10 MG 24 hr capsule Take 1 capsule (10 mg total) by mouth daily. (Patient not taking: Reported on 07/21/2023) 21 capsule 0   dexmethylphenidate (FOCALIN XR) 15 MG 24 hr capsule Take 1 capsule (15 mg total) by mouth daily. (Patient not taking: Reported on 07/21/2023) 30 capsule 0   dexmethylphenidate (FOCALIN XR) 15 MG 24 hr capsule Take 1 capsule (15 mg total) by mouth daily. (Patient not taking: Reported on 07/21/2023) 30 capsule 0   dexmethylphenidate (FOCALIN XR) 5 MG 24 hr capsule Take 1 capsule (5 mg total) by mouth daily. (Patient not taking: Reported on 07/21/2023) 14 capsule 0   estradiol (ESTRACE) 1 MG tablet Take by mouth. (Patient not taking: Reported on 07/21/2023)     No current facility-administered medications on file prior to visit.   Allergies  Allergen Reactions    Haemophilus B Polysaccharide Vaccine     Other reaction(s): Other (See Comments) Claims to have had very bad congestion and headaches for 7 days post each of her last flu shots in 2011 and 2002 REFUSES   Vicodin [Hydrocodone-Acetaminophen] Nausea Only   Influenza Vaccine Live Other (See Comments)    Claims to have had very bad congestion and headaches for 7 days post each of her last flu shots in 2011 and 2002 REFUSES   Latex Rash    Past Medical History:  Diagnosis Date   Anemia    Anxiety    Asthma 04/02/2015   Blood transfusion 05/11/1995   contracted HIV from transfusion   Depression    Flu-like symptoms 04/02/2015   HIV (human immunodeficiency virus infection) (HCC)    Preterm labor    Smoker 04/02/2015   Past Surgical History:  Procedure Laterality Date   CESAREAN SECTION  04/2000   CESAREAN SECTION  08/18/2011   Procedure: CESAREAN SECTION;  Surgeon: Reva Bores, MD;  Location: [facility];  Service: Gynecology;  Laterality: N/A;  Repeat.  Will need AZT 3 hours before   Social History   Socioeconomic History   Marital status: Legally Separated    Spouse name: Not on file   Number of children: Not on file   Years of education: Not on file   Highest education level: Not on file  Occupational History   Not on file  Tobacco Use   Smoking status: Former    Current packs/day: 0.50    Types: Cigarettes   Smokeless tobacco: Never  Vaping Use   Vaping status: Never Used  Substance and Sexual Activity   Alcohol use: No    Alcohol/week: 0.0 standard drinks of alcohol   Drug use: No   Sexual activity: Yes    Birth control/protection: None  Other Topics Concern   Not on file  Social History Narrative   Not on file   Social Drivers of Health   Financial Resource Strain: Low Risk  (07/05/2023)   Received from [facility]   Overall Financial Resource  Strain (CARDIA)    Difficulty of Paying Living Expenses: Not hard at all  Food Insecurity: No Food Insecurity  (07/05/2023)   Received from [facility]   Hunger Vital Sign    Worried About Running Out of Food in the Last Year: Never true    Ran Out of Food in the Last Year: Never true  Transportation Needs: No Transportation Needs (07/05/2023)   [facility] - Transportation    Lack of Transportation (Medical): No    Lack of Transportation (Non-Medical): No  Physical Activity: Not on file  Stress: Not on file  Social Connections: Unknown (09/17/2021)   Received from [facility]   Social Network    Social Network: Not on file  Intimate Partner Violence: Unknown (08/12/2021)   Received from [facility]   HITS    Physically Hurt: Not on file    Insult or Talk Down To: Not on file    Threaten Physical Harm: Not on file    Scream or Curse: Not on file   Family History  Problem Relation Age of Onset   Other Neg Hx    Vitals  BP 117/81   Pulse 84   Resp 16   Ht 5\' 3"  (1.6 m)   Wt 248 lb (112.5 kg)   LMP 07/13/2023   SpO2 98%   BMI 43.93 kg/m    Examination  Gen: no acute distress.  Morbid obesity HEENT: [place]/AT, no scleral icterus, no pale conjunctivae, hearing normal, oral mucosa moist Neck: Supple Cardio: Regular rate and rhythm Resp: Pulmonary effort normal in room air GI: nondistended GU: Musc: Extremities: No pedal edema Skin: No rashes Neuro: grossly non focal , awake, alert and oriented * 3  Psych: Calm, cooperative  Lab Results HIV 1 RNA Quant  Date Value  06/13/2023 Not Detected Copies/mL  09/09/2021 NOT DETECTED copies/mL  08/18/2020 Not Detected Copies/mL   CD4 T Cell Abs (/uL)  Date Value  06/13/2023 1,027  09/09/2021 895  08/18/2020 1,066   No results found for: "HIV1GENOSEQ" Lab Results  Component Value Date   WBC 6.3 06/13/2023   HGB 10.0 (L) 06/13/2023   HCT 31.3 (L) 06/13/2023   MCV 80.5 06/13/2023   PLT 314 06/13/2023    Lab Results  Component Value Date   CREATININE 0.50 06/13/2023    BUN 12 06/13/2023   NA 138 06/13/2023   K 4.1 06/13/2023   CL 104 06/13/2023   CO2 25 06/13/2023   Lab Results  Component Value Date   ALT 14 06/13/2023   AST 15 06/13/2023   ALKPHOS 74 01/19/2017   BILITOT 0.2 06/13/2023    Lab Results  Component Value Date   CHOL 180 06/13/2023   TRIG 64 06/13/2023   HDL 65 06/13/2023   LDLCALC 100 (H) 06/13/2023   Lab Results  Component Value Date   HAV POS (A) 05/13/2011   Lab Results  Component Value Date   HEPBSAG NEGATIVE 05/13/2011   HEPBSAB POS (A) 05/13/2011   Lab Results  Component Value Date   HCVAB NEGATIVE 10/27/2015   Lab Results  Component Value Date   CHLAMYDIAWP Negative 06/13/2023   N Negative 06/13/2023   No results found for: "GCPROBEAPT" Lab Results  Component Value Date   QUANTGOLD NEGATIVE 05/17/2013   Health Maintenance: Immunization History  Administered Date(s) Administered   Influenza Split 05/10/2009   Moderna Sars-Covid-2 Vaccination 11/27/2019, 01/01/2020   PPD Test 09/24/2010, 05/17/2011, 03/14/2014,  03/25/2014, 07/08/2021   Pneumococcal Polysaccharide-23 09/26/2009, 12/08/2012   Rubella 02/17/2011   Tdap 06/17/2011, 06/17/2016   Tetanus 05/01/2009   Assessment/Plan: # HIV -Continue Biktarvy, refill sent. To be spaced out with Mvs/Fe -Labs from 2/3 discussed -Next appointment in 3 months and can do every 6 months thereafter if stays undetectable   # Morbid Obesity/Prediabetes  - FU with PCP  # Anxiety/Depression  - stable, no SI/HI  # STD screening  -No acute concerns -Urine GC and RPR negative  #Immunization  - did not want to do any vaccines today   #Health maintenance Lipid panel today Immune to Hep A and B Hep C ab negative 10/2015 Quantiferon negative 05/2013 OI ppx if indicated None  HLA B5701 Neg 10/2015 Dental Care - follows with dentist  Pap smear/Mammogram/Colonoscopy - she is aware about these screenings and will fu with her PCP.  Patient's labs were reviewed  as well as his previous records. Patients questions were addressed and answered. Safe sex counseling done.  I have personally spent 30 minutes involved in face-to-face and non-face-to-face activities for this patient on the day of the visit. Professional time spent includes the following activities: Preparing to see the patient (review of tests), Obtaining and/or reviewing separately obtained history (admission/discharge record), Performing a medically appropriate examination and/or evaluation , Ordering medications/tests/procedures, referring and communicating with other health care professionals, Documenting clinical information in the EMR, Independently interpreting results (not separately reported), Communicating results to the patient/family/caregiver, Counseling and educating the patient/family/caregiver and Care coordination (not separately reported).   Of note, portions of this note may have been created with voice recognition software. While this note has been edited for accuracy, occasional wrong-word or 'sound-a-like' substitutions may have occurred due to the inherent limitations of voice recognition software.   Electronically signed by:  Odette Fraction, MD Infectious Disease Physician [facility] [address] [place], [place] [address] Phone: [phone]  Fax: [phone]

## 2023-07-21 NOTE — Progress Notes (Addendum)
 Error

## 2023-08-01 ENCOUNTER — Other Ambulatory Visit (HOSPITAL_COMMUNITY): Payer: Self-pay

## 2023-08-01 MED ORDER — LISDEXAMFETAMINE DIMESYLATE 10 MG PO CAPS
10.0000 mg | ORAL_CAPSULE | Freq: Every morning | ORAL | 0 refills | Status: DC
  Filled 2023-10-12 (×2): qty 30, 30d supply, fill #0

## 2023-08-01 MED ORDER — LISDEXAMFETAMINE DIMESYLATE 10 MG PO CAPS
10.0000 mg | ORAL_CAPSULE | Freq: Every day | ORAL | 0 refills | Status: DC
  Filled 2023-08-01 – 2023-08-02 (×2): qty 30, 30d supply, fill #0

## 2023-08-02 ENCOUNTER — Other Ambulatory Visit (HOSPITAL_COMMUNITY): Payer: Self-pay

## 2023-08-02 MED ORDER — ONDANSETRON HCL 4 MG PO TABS
4.0000 mg | ORAL_TABLET | Freq: Three times a day (TID) | ORAL | 0 refills | Status: DC | PRN
  Filled 2023-08-02: qty 30, 10d supply, fill #0

## 2023-08-02 MED ORDER — WEGOVY 0.5 MG/0.5ML ~~LOC~~ SOAJ
0.5000 mg | SUBCUTANEOUS | 0 refills | Status: DC
  Filled 2023-08-02: qty 2, 28d supply, fill #0

## 2023-08-03 ENCOUNTER — Other Ambulatory Visit: Payer: Self-pay

## 2023-08-04 ENCOUNTER — Other Ambulatory Visit: Payer: Self-pay

## 2023-08-08 ENCOUNTER — Other Ambulatory Visit (HOSPITAL_COMMUNITY): Payer: Self-pay

## 2023-08-10 ENCOUNTER — Other Ambulatory Visit (HOSPITAL_COMMUNITY): Payer: Self-pay

## 2023-08-15 ENCOUNTER — Other Ambulatory Visit: Payer: Self-pay

## 2023-08-15 ENCOUNTER — Other Ambulatory Visit (HOSPITAL_COMMUNITY): Payer: Self-pay

## 2023-08-15 NOTE — Progress Notes (Signed)
 Specialty Pharmacy Refill Coordination Note  [name] [name] is a 53 y.o. female contacted today regarding refills of specialty medication(s) Bictegravir-Emtricitab-Tenofov [name])   Patient requested Delivery   Delivery date: 08/17/23   Verified address: [address] [name], [address]   Medication will be filled on 08/16/23.

## 2023-08-30 ENCOUNTER — Other Ambulatory Visit (HOSPITAL_COMMUNITY): Payer: Self-pay

## 2023-08-30 ENCOUNTER — Other Ambulatory Visit: Payer: Self-pay

## 2023-08-30 MED ORDER — WEGOVY 1 MG/0.5ML ~~LOC~~ SOAJ
1.0000 mg | SUBCUTANEOUS | 0 refills | Status: DC
  Filled 2023-08-30: qty 2, 28d supply, fill #0

## 2023-08-30 MED ORDER — ONDANSETRON HCL 4 MG PO TABS
4.0000 mg | ORAL_TABLET | Freq: Three times a day (TID) | ORAL | 0 refills | Status: DC | PRN
Start: 2023-08-30 — End: 2023-11-17
  Filled 2023-08-30: qty 30, 10d supply, fill #0

## 2023-09-05 ENCOUNTER — Other Ambulatory Visit (HOSPITAL_COMMUNITY): Payer: Self-pay

## 2023-09-05 NOTE — Progress Notes (Signed)
 Specialty Pharmacy Ongoing Clinical Assessment Note  [name] [name] is a 53 y.o. female who is being followed by the specialty pharmacy service for RxSp HIV   Patient's specialty medication(s) reviewed today: Bictegravir-Emtricitab-Tenofov (Biktarvy )   Missed doses in the last 4 weeks: 0   Patient/Caregiver did not have any additional questions or concerns.   Therapeutic benefit summary: Patient is achieving benefit   Adverse events/side effects summary: No adverse events/side effects   Patient's therapy is appropriate to: Continue    Goals Addressed             This Visit's Progress    Achieve Undetectable HIV Viral Load < 20   On track    Patient is on track. Patient will work on increased adherence.  Patient's viral load remains undetectable as of 06/13/23.          Follow up:  6 months  Malachi Screws Specialty Pharmacist

## 2023-09-05 NOTE — Progress Notes (Signed)
 Specialty Pharmacy Refill Coordination Note  [name] [name] is a 53 y.o. female contacted today regarding refills of specialty medication(s) Bictegravir-Emtricitab-Tenofov (Biktarvy )   Patient requested Delivery   Delivery date: 09/13/23   Verified address: [address] [name], [address]   Medication will be filled on 09/12/23.

## 2023-09-26 NOTE — Progress Notes (Signed)
 The 10-year ASCVD risk score (Arnett DK, et al., 2019) is: 2.9%   Values used to calculate the score:     Age: 53 years     Sex: Female     Is Non-Hispanic African American: No     Diabetic: No     Tobacco smoker: Yes     Systolic Blood Pressure: 118 mmHg     Is BP treated: No     HDL Cholesterol: 65 mg/dL     Total Cholesterol: 180 mg/dL  Arlon Bergamo, BSN, RN

## 2023-09-29 ENCOUNTER — Other Ambulatory Visit (HOSPITAL_COMMUNITY): Payer: Self-pay

## 2023-09-29 MED ORDER — WEGOVY 1.7 MG/0.75ML ~~LOC~~ SOAJ
1.7000 mg | SUBCUTANEOUS | 0 refills | Status: DC
Start: 2023-09-29 — End: 2023-11-17
  Filled 2023-09-29: qty 3, 28d supply, fill #0

## 2023-09-30 ENCOUNTER — Other Ambulatory Visit (HOSPITAL_COMMUNITY): Payer: Self-pay

## 2023-10-04 ENCOUNTER — Other Ambulatory Visit (HOSPITAL_COMMUNITY): Payer: Self-pay

## 2023-10-07 ENCOUNTER — Other Ambulatory Visit: Payer: Self-pay

## 2023-10-07 NOTE — Progress Notes (Signed)
 Specialty Pharmacy Refill Coordination Note  Waynesha Rammel is a 53 y.o. female contacted today regarding refills of specialty medication(s) Bictegravir-Emtricitab-Tenofov (Biktarvy )   Patient requested Delivery   Delivery date: 10/10/23   Verified address: [address]  [place] [address]   Medication will be filled on 10/07/23

## 2023-10-12 ENCOUNTER — Other Ambulatory Visit (HOSPITAL_COMMUNITY): Payer: Self-pay

## 2023-10-13 ENCOUNTER — Other Ambulatory Visit (HOSPITAL_COMMUNITY): Payer: Self-pay

## 2023-10-25 ENCOUNTER — Other Ambulatory Visit: Payer: Self-pay

## 2023-10-26 ENCOUNTER — Ambulatory Visit: Admitting: Internal Medicine

## 2023-10-31 ENCOUNTER — Other Ambulatory Visit

## 2023-10-31 ENCOUNTER — Other Ambulatory Visit: Payer: Self-pay

## 2023-10-31 DIAGNOSIS — Z113 Encounter for screening for infections with a predominantly sexual mode of transmission: Secondary | ICD-10-CM

## 2023-10-31 DIAGNOSIS — B20 Human immunodeficiency virus [HIV] disease: Secondary | ICD-10-CM

## 2023-11-01 LAB — T-HELPER CELL (CD4) - (RCID CLINIC ONLY)
CD4 % Helper T Cell: 48 % (ref 33–65)
CD4 T Cell Abs: 880 /uL (ref 400–1790)

## 2023-11-02 LAB — COMPLETE METABOLIC PANEL WITHOUT GFR
AG Ratio: 1.2 (calc) (ref 1.0–2.5)
ALT: 11 U/L (ref 6–29)
AST: 13 U/L (ref 10–35)
Albumin: 3.4 g/dL — ABNORMAL LOW (ref 3.6–5.1)
Alkaline phosphatase (APISO): 95 U/L (ref 37–153)
BUN: 11 mg/dL (ref 7–25)
CO2: 28 mmol/L (ref 20–32)
Calcium: 8.4 mg/dL — ABNORMAL LOW (ref 8.6–10.4)
Chloride: 105 mmol/L (ref 98–110)
Creat: 0.62 mg/dL (ref 0.50–1.03)
Globulin: 2.9 g/dL (ref 1.9–3.7)
Glucose, Bld: 90 mg/dL (ref 65–99)
Potassium: 4.1 mmol/L (ref 3.5–5.3)
Sodium: 139 mmol/L (ref 135–146)
Total Bilirubin: 0.2 mg/dL (ref 0.2–1.2)
Total Protein: 6.3 g/dL (ref 6.1–8.1)

## 2023-11-02 LAB — CBC WITH DIFFERENTIAL/PLATELET
Absolute Lymphocytes: 1982 {cells}/uL (ref 850–3900)
Absolute Monocytes: 372 {cells}/uL (ref 200–950)
Basophils Absolute: 41 {cells}/uL (ref 0–200)
Basophils Relative: 0.7 %
Eosinophils Absolute: 130 {cells}/uL (ref 15–500)
Eosinophils Relative: 2.2 %
HCT: 32 % — ABNORMAL LOW (ref 35.0–45.0)
Hemoglobin: 9.9 g/dL — ABNORMAL LOW (ref 11.7–15.5)
MCH: 25.6 pg — ABNORMAL LOW (ref 27.0–33.0)
MCHC: 30.9 g/dL — ABNORMAL LOW (ref 32.0–36.0)
MCV: 82.9 fL (ref 80.0–100.0)
MPV: 10.1 fL (ref 7.5–12.5)
Monocytes Relative: 6.3 %
Neutro Abs: 3375 {cells}/uL (ref 1500–7800)
Neutrophils Relative %: 57.2 %
Platelets: 326 10*3/uL (ref 140–400)
RBC: 3.86 10*6/uL (ref 3.80–5.10)
RDW: 17.9 % — ABNORMAL HIGH (ref 11.0–15.0)
Total Lymphocyte: 33.6 %
WBC: 5.9 10*3/uL (ref 3.8–10.8)

## 2023-11-02 LAB — HIV-1 RNA QUANT-NO REFLEX-BLD
HIV 1 RNA Quant: NOT DETECTED {copies}/mL
HIV-1 RNA Quant, Log: NOT DETECTED {Log_copies}/mL

## 2023-11-02 LAB — RPR: RPR Ser Ql: NONREACTIVE

## 2023-11-04 ENCOUNTER — Other Ambulatory Visit: Payer: Self-pay

## 2023-11-07 ENCOUNTER — Other Ambulatory Visit: Payer: Self-pay

## 2023-11-07 NOTE — Progress Notes (Signed)
 Specialty Pharmacy Refill Coordination Note  [name] [name] is a 53 y.o. female contacted today regarding refills of specialty medication(s) Bictegravir-Emtricitab-Tenofov (Biktarvy )   Patient requested Delivery   Delivery date: 11/09/23   Verified address: [address]  [place] [address]   Medication will be filled on 11/08/23.

## 2023-11-17 ENCOUNTER — Other Ambulatory Visit (HOSPITAL_COMMUNITY): Payer: Self-pay

## 2023-11-17 MED ORDER — ZEPBOUND 2.5 MG/0.5ML ~~LOC~~ SOAJ
2.5000 mg | SUBCUTANEOUS | 0 refills | Status: DC
  Filled 2023-11-17 – 2023-11-28 (×4): qty 2, 28d supply, fill #0

## 2023-11-17 MED ORDER — ONDANSETRON HCL 4 MG PO TABS
4.0000 mg | ORAL_TABLET | Freq: Three times a day (TID) | ORAL | 0 refills | Status: AC | PRN
  Filled 2023-11-17 – 2023-11-28 (×2): qty 30, 10d supply, fill #0

## 2023-11-21 ENCOUNTER — Other Ambulatory Visit (HOSPITAL_COMMUNITY): Payer: Self-pay

## 2023-11-25 ENCOUNTER — Other Ambulatory Visit: Payer: Self-pay

## 2023-11-28 ENCOUNTER — Other Ambulatory Visit (HOSPITAL_COMMUNITY): Payer: Self-pay

## 2023-12-01 ENCOUNTER — Other Ambulatory Visit: Payer: Self-pay

## 2023-12-01 ENCOUNTER — Other Ambulatory Visit (HOSPITAL_COMMUNITY): Payer: Self-pay

## 2023-12-01 MED ORDER — LISDEXAMFETAMINE DIMESYLATE 20 MG PO CAPS
20.0000 mg | ORAL_CAPSULE | Freq: Every morning | ORAL | 0 refills | Status: AC
  Filled 2023-12-01 – 2023-12-14 (×3): qty 30, 30d supply, fill #0

## 2023-12-01 MED ORDER — CLONIDINE HCL 0.1 MG PO TABS
0.1000 mg | ORAL_TABLET | Freq: Every day | ORAL | 2 refills | Status: AC
  Filled 2023-12-01 – 2023-12-14 (×2): qty 30, 30d supply, fill #0

## 2023-12-01 NOTE — Progress Notes (Signed)
 Specialty Pharmacy Refill Coordination Note  [name] [name] is a 53 y.o. female contacted today regarding refills of specialty medication(s) Bictegravir-Emtricitab-Tenofov (Biktarvy )   Patient requested Delivery   Delivery date: 12/05/23   Verified address: [address]  [place] [address]   Medication will be filled on 12/02/23.

## 2023-12-06 ENCOUNTER — Other Ambulatory Visit (HOSPITAL_COMMUNITY): Payer: Self-pay

## 2023-12-09 ENCOUNTER — Ambulatory Visit: Admitting: Internal Medicine

## 2023-12-12 ENCOUNTER — Other Ambulatory Visit (HOSPITAL_COMMUNITY): Payer: Self-pay

## 2023-12-13 ENCOUNTER — Other Ambulatory Visit (HOSPITAL_COMMUNITY): Payer: Self-pay

## 2023-12-13 MED ORDER — ZEPBOUND 5 MG/0.5ML ~~LOC~~ SOAJ
5.0000 mg | SUBCUTANEOUS | 1 refills | Status: DC
  Filled 2023-12-13 – 2023-12-22 (×3): qty 2, 28d supply, fill #0

## 2023-12-14 ENCOUNTER — Other Ambulatory Visit (HOSPITAL_COMMUNITY): Payer: Self-pay

## 2023-12-20 ENCOUNTER — Other Ambulatory Visit (HOSPITAL_COMMUNITY): Payer: Self-pay

## 2023-12-22 ENCOUNTER — Other Ambulatory Visit (HOSPITAL_COMMUNITY): Payer: Self-pay

## 2023-12-23 ENCOUNTER — Other Ambulatory Visit: Payer: Self-pay

## 2023-12-23 ENCOUNTER — Other Ambulatory Visit (HOSPITAL_COMMUNITY): Payer: Self-pay

## 2023-12-27 ENCOUNTER — Other Ambulatory Visit (HOSPITAL_COMMUNITY): Payer: Self-pay

## 2023-12-28 ENCOUNTER — Ambulatory Visit

## 2023-12-28 ENCOUNTER — Other Ambulatory Visit (HOSPITAL_COMMUNITY): Payer: Self-pay

## 2023-12-28 MED ORDER — SODIUM FLUORIDE 1.1 % DT CREA
TOPICAL_CREAM | DENTAL | 3 refills | Status: DC
  Filled 2023-12-28: qty 51, 30d supply, fill #0

## 2024-01-05 ENCOUNTER — Other Ambulatory Visit (HOSPITAL_COMMUNITY): Payer: Self-pay

## 2024-01-05 MED ORDER — LISDEXAMFETAMINE DIMESYLATE 20 MG PO CAPS
20.0000 mg | ORAL_CAPSULE | Freq: Every morning | ORAL | 0 refills | Status: AC
  Filled 2024-01-18: qty 30, 30d supply, fill #0

## 2024-01-10 ENCOUNTER — Other Ambulatory Visit (HOSPITAL_COMMUNITY): Payer: Self-pay

## 2024-01-10 MED ORDER — ZEPBOUND 5 MG/0.5ML ~~LOC~~ SOAJ
5.0000 mg | SUBCUTANEOUS | 1 refills | Status: DC
  Filled 2024-01-10 – 2024-01-16 (×2): qty 2, 28d supply, fill #0

## 2024-01-11 ENCOUNTER — Ambulatory Visit (INDEPENDENT_AMBULATORY_CARE_PROVIDER_SITE_OTHER): Admitting: Internal Medicine

## 2024-01-11 ENCOUNTER — Other Ambulatory Visit: Payer: Self-pay

## 2024-01-11 ENCOUNTER — Encounter: Payer: Self-pay | Admitting: Internal Medicine

## 2024-01-11 VITALS — BP 117/70 | HR 73 | Temp 97.4°F | Ht 63.0 in | Wt 226.0 lb

## 2024-01-11 DIAGNOSIS — D508 Other iron deficiency anemias: Secondary | ICD-10-CM | POA: Diagnosis not present

## 2024-01-11 DIAGNOSIS — B20 Human immunodeficiency virus [HIV] disease: Secondary | ICD-10-CM

## 2024-01-11 DIAGNOSIS — Z79899 Other long term (current) drug therapy: Secondary | ICD-10-CM

## 2024-01-11 DIAGNOSIS — Z0000 Encounter for general adult medical examination without abnormal findings: Secondary | ICD-10-CM
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
## 2024-01-11 NOTE — Patient Instructions (Signed)
 Please schedule a pap smear with [facility] [phone]

## 2024-01-12 LAB — IRON,TIBC AND FERRITIN PANEL
%SAT: 6 % — ABNORMAL LOW (ref 16–45)
Ferritin: 10 ng/mL — ABNORMAL LOW (ref 16–232)
Iron: 23 ug/dL — ABNORMAL LOW (ref 45–160)
TIBC: 375 ug/dL (ref 250–450)

## 2024-01-16 ENCOUNTER — Other Ambulatory Visit (HOSPITAL_COMMUNITY): Payer: Self-pay

## 2024-01-18 ENCOUNTER — Other Ambulatory Visit: Payer: Self-pay

## 2024-01-18 ENCOUNTER — Other Ambulatory Visit (HOSPITAL_COMMUNITY): Payer: Self-pay

## 2024-01-23 ENCOUNTER — Other Ambulatory Visit: Payer: Self-pay

## 2024-01-23 ENCOUNTER — Other Ambulatory Visit (HOSPITAL_COMMUNITY): Payer: Self-pay

## 2024-02-15 ENCOUNTER — Other Ambulatory Visit: Payer: Self-pay

## 2024-02-17 ENCOUNTER — Other Ambulatory Visit: Payer: Self-pay | Admitting: Pharmacy Technician

## 2024-02-17 ENCOUNTER — Other Ambulatory Visit: Payer: Self-pay

## 2024-02-17 NOTE — Progress Notes (Signed)
 Specialty Pharmacy Refill Coordination Note  [name] [name] is a 53 y.o. female contacted today regarding refills of specialty medication(s) Bictegravir-Emtricitab-Tenofov (Biktarvy )   Patient requested Delivery   Delivery date: 02/21/24   Verified address: [address]  [place]   Medication will be filled on 02/20/24.

## 2024-03-14 ENCOUNTER — Other Ambulatory Visit: Payer: Self-pay

## 2024-03-16 ENCOUNTER — Other Ambulatory Visit: Payer: Self-pay

## 2024-03-16 ENCOUNTER — Other Ambulatory Visit (HOSPITAL_COMMUNITY): Payer: Self-pay

## 2024-03-16 NOTE — Progress Notes (Signed)
 Specialty Pharmacy Refill Coordination Note  [name] [name] is a 53 y.o. female contacted today regarding refills of specialty medication(s) Bictegravir-Emtricitab-Tenofov (Biktarvy )   Patient requested Delivery   Delivery date: 03/20/24   Verified address: [address]  [place]   Medication will be filled on: 03/19/24

## 2024-03-19 ENCOUNTER — Other Ambulatory Visit: Payer: Self-pay

## 2024-04-04 ENCOUNTER — Other Ambulatory Visit: Payer: Self-pay

## 2024-04-04 ENCOUNTER — Ambulatory Visit

## 2024-04-11 ENCOUNTER — Encounter: Payer: Self-pay | Admitting: Internal Medicine

## 2024-04-11 ENCOUNTER — Ambulatory Visit: Admitting: Internal Medicine

## 2024-04-11 ENCOUNTER — Other Ambulatory Visit (HOSPITAL_COMMUNITY): Payer: Self-pay

## 2024-04-11 ENCOUNTER — Other Ambulatory Visit: Payer: Self-pay

## 2024-04-11 VITALS — BP 112/80 | HR 72 | Temp 98.1°F | Ht 62.0 in | Wt 226.0 lb

## 2024-04-11 DIAGNOSIS — Z79899 Other long term (current) drug therapy: Secondary | ICD-10-CM

## 2024-04-11 DIAGNOSIS — Z6841 Body Mass Index (BMI) 40.0 and over, adult: Secondary | ICD-10-CM | POA: Diagnosis not present

## 2024-04-11 DIAGNOSIS — B20 Human immunodeficiency virus [HIV] disease: Secondary | ICD-10-CM | POA: Diagnosis present

## 2024-04-11 LAB — COMPLETE METABOLIC PANEL WITHOUT GFR
AG Ratio: 1.2 (calc) (ref 1.0–2.5)
ALT: 11 U/L (ref 6–29)
AST: 14 U/L (ref 10–35)
Albumin: 3.9 g/dL (ref 3.6–5.1)
Alkaline phosphatase (APISO): 100 U/L (ref 37–153)
BUN: 9 mg/dL (ref 7–25)
CO2: 26 mmol/L (ref 20–32)
Calcium: 8.9 mg/dL (ref 8.6–10.4)
Chloride: 102 mmol/L (ref 98–110)
Creat: 0.6 mg/dL (ref 0.50–1.03)
Globulin: 3.3 g/dL (ref 1.9–3.7)
Glucose, Bld: 87 mg/dL (ref 65–99)
Potassium: 4 mmol/L (ref 3.5–5.3)
Sodium: 137 mmol/L (ref 135–146)
Total Bilirubin: 0.2 mg/dL (ref 0.2–1.2)
Total Protein: 7.2 g/dL (ref 6.1–8.1)

## 2024-04-11 MED ORDER — BIKTARVY 50-200-25 MG PO TABS: 1.0000 | ORAL_TABLET | Freq: Every day | ORAL | 11 refills | Status: AC
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
## 2024-04-12 ENCOUNTER — Other Ambulatory Visit: Payer: Self-pay

## 2024-04-12 LAB — T-HELPER CELL (CD4) - (RCID CLINIC ONLY)
CD4 % Helper T Cell: 50 % (ref 33–65)
CD4 T Cell Abs: 974 /uL (ref 400–1790)

## 2024-04-12 NOTE — Progress Notes (Signed)
 Patient wants to go to [facility]

## 2024-04-13 LAB — CBC WITH DIFFERENTIAL/PLATELET
Absolute Lymphocytes: 2192 {cells}/uL (ref 850–3900)
Absolute Monocytes: 292 {cells}/uL (ref 200–950)
Basophils Absolute: 38 {cells}/uL (ref 0–200)
Basophils Relative: 0.7 %
Eosinophils Absolute: 103 {cells}/uL (ref 15–500)
Eosinophils Relative: 1.9 %
HCT: 35.6 % — ABNORMAL LOW (ref 35.9–46.0)
Hemoglobin: 10.9 g/dL — ABNORMAL LOW (ref 11.7–15.5)
MCH: 24.9 pg — ABNORMAL LOW (ref 27.0–33.0)
MCHC: 30.6 g/dL — ABNORMAL LOW (ref 31.6–35.4)
MCV: 81.5 fL (ref 81.4–101.7)
MPV: 9.8 fL (ref 7.5–12.5)
Monocytes Relative: 5.4 %
Neutro Abs: 2776 {cells}/uL (ref 1500–7800)
Neutrophils Relative %: 51.4 %
Platelets: 361 Thousand/uL (ref 140–400)
RBC: 4.37 Million/uL (ref 3.80–5.10)
RDW: 18.6 % — ABNORMAL HIGH (ref 11.0–15.0)
Total Lymphocyte: 40.6 %
WBC: 5.4 Thousand/uL (ref 3.8–10.8)

## 2024-04-13 LAB — HIV-1 RNA QUANT-NO REFLEX-BLD
HIV 1 RNA Quant: <20 {copies}/mL — AB
HIV-1 RNA Quant, Log: <1.3 {Log_copies}/mL — AB

## 2024-04-13 LAB — THYROID PANEL WITH TSH
Free Thyroxine Index: 2.4 (ref 1.4–3.8)
T3 Uptake: 30 % (ref 22–35)
T4, Total: 8.1 ug/dL (ref 5.1–11.9)
TSH: 1.81 m[IU]/L

## 2024-04-13 LAB — HEMOGLOBIN A1C
Hgb A1c MFr Bld: 5.6 %
Mean Plasma Glucose: 114 mg/dL
eAG (mmol/L): 6.3 mmol/L

## 2024-04-13 LAB — SYPHILIS: RPR W/REFLEX TO RPR TITER AND TREPONEMAL ANTIBODIES, TRADITIONAL SCREENING AND DIAGNOSIS ALGORITHM: RPR Ser Ql: NONREACTIVE

## 2024-04-18 ENCOUNTER — Encounter: Payer: Self-pay | Admitting: Internal Medicine

## 2024-05-16 ENCOUNTER — Other Ambulatory Visit (HOSPITAL_COMMUNITY): Payer: Self-pay

## 2024-05-29 ENCOUNTER — Other Ambulatory Visit (HOSPITAL_COMMUNITY): Payer: Self-pay

## 2024-05-29 MED ORDER — LISDEXAMFETAMINE DIMESYLATE 20 MG PO CAPS
20.0000 mg | ORAL_CAPSULE | Freq: Every morning | ORAL | 0 refills | Status: AC
  Filled 2024-05-29: qty 30, 30d supply, fill #0

## 2024-05-31 ENCOUNTER — Other Ambulatory Visit (HOSPITAL_COMMUNITY): Payer: Self-pay

## 2024-10-04 ENCOUNTER — Ambulatory Visit: Admitting: Internal Medicine
